# Patient Record
Sex: Male | Born: 1950 | Hispanic: Yes | Smoking: Former smoker
Health system: Southern US, Community
[De-identification: ages and names within clinical notes are randomized; demographics above are authoritative.]

## PROBLEM LIST (undated history)

## (undated) DIAGNOSIS — R972 Elevated prostate specific antigen [PSA]: Secondary | ICD-10-CM

## (undated) DIAGNOSIS — R0602 Shortness of breath: Secondary | ICD-10-CM

## (undated) DIAGNOSIS — K8020 Calculus of gallbladder without cholecystitis without obstruction: Secondary | ICD-10-CM

## (undated) DIAGNOSIS — I5030 Unspecified diastolic (congestive) heart failure: Secondary | ICD-10-CM

## (undated) DIAGNOSIS — Z7901 Long term (current) use of anticoagulants: Secondary | ICD-10-CM

## (undated) DIAGNOSIS — K7689 Other specified diseases of liver: Secondary | ICD-10-CM

## (undated) DIAGNOSIS — I2510 Atherosclerotic heart disease of native coronary artery without angina pectoris: Secondary | ICD-10-CM

## (undated) DIAGNOSIS — N401 Enlarged prostate with lower urinary tract symptoms: Secondary | ICD-10-CM

## (undated) DIAGNOSIS — I499 Cardiac arrhythmia, unspecified: Secondary | ICD-10-CM

## (undated) DIAGNOSIS — G4730 Sleep apnea, unspecified: Secondary | ICD-10-CM

## (undated) DIAGNOSIS — I10 Essential (primary) hypertension: Secondary | ICD-10-CM

## (undated) DIAGNOSIS — N4289 Other specified disorders of prostate: Secondary | ICD-10-CM

## (undated) DIAGNOSIS — M1990 Unspecified osteoarthritis, unspecified site: Secondary | ICD-10-CM

## (undated) DIAGNOSIS — N529 Male erectile dysfunction, unspecified: Secondary | ICD-10-CM

## (undated) DIAGNOSIS — G4733 Obstructive sleep apnea (adult) (pediatric): Secondary | ICD-10-CM

## (undated) DIAGNOSIS — I517 Cardiomegaly: Secondary | ICD-10-CM

## (undated) DIAGNOSIS — I700 Atherosclerosis of aorta: Secondary | ICD-10-CM

## (undated) DIAGNOSIS — E785 Hyperlipidemia, unspecified: Secondary | ICD-10-CM

## (undated) DIAGNOSIS — R399 Unspecified symptoms and signs involving the genitourinary system: Secondary | ICD-10-CM

## (undated) DIAGNOSIS — R079 Chest pain, unspecified: Secondary | ICD-10-CM

## (undated) DIAGNOSIS — R1310 Dysphagia, unspecified: Secondary | ICD-10-CM

## (undated) DIAGNOSIS — I480 Paroxysmal atrial fibrillation: Secondary | ICD-10-CM

## (undated) DIAGNOSIS — I509 Heart failure, unspecified: Secondary | ICD-10-CM

## (undated) DIAGNOSIS — N138 Other obstructive and reflux uropathy: Secondary | ICD-10-CM

## (undated) DIAGNOSIS — K219 Gastro-esophageal reflux disease without esophagitis: Secondary | ICD-10-CM

## (undated) HISTORY — DX: Male erectile dysfunction, unspecified: N52.9

## (undated) HISTORY — DX: Unspecified osteoarthritis, unspecified site: M19.90

## (undated) HISTORY — DX: Cardiomegaly: I51.7

## (undated) HISTORY — DX: Cardiac arrhythmia, unspecified: I49.9

## (undated) HISTORY — DX: Heart failure, unspecified: I50.9

## (undated) HISTORY — PX: HERNIA REPAIR: SHX51

## (undated) HISTORY — DX: Essential (primary) hypertension: I10

## (undated) HISTORY — DX: Sleep apnea, unspecified: G47.30

## (undated) HISTORY — DX: Shortness of breath: R06.02

## (undated) HISTORY — DX: Dysphagia, unspecified: R13.10

## (undated) HISTORY — DX: Elevated prostate specific antigen (PSA): R97.20

## (undated) HISTORY — DX: Atherosclerotic heart disease of native coronary artery without angina pectoris: I25.10

## (undated) HISTORY — DX: Unspecified symptoms and signs involving the genitourinary system: R39.9

## (undated) HISTORY — DX: Benign prostatic hyperplasia with lower urinary tract symptoms: N40.1

## (undated) HISTORY — DX: Other obstructive and reflux uropathy: N13.8

## (undated) HISTORY — DX: Other specified disorders of prostate: N42.89

## (undated) HISTORY — DX: Gastro-esophageal reflux disease without esophagitis: K21.9

---

## 2012-11-15 DIAGNOSIS — I2510 Atherosclerotic heart disease of native coronary artery without angina pectoris: Secondary | ICD-10-CM

## 2012-11-15 HISTORY — DX: Atherosclerotic heart disease of native coronary artery without angina pectoris: I25.10

## 2012-11-15 HISTORY — PX: CARDIAC CATHETERIZATION: SHX172

## 2012-11-15 HISTORY — PX: CORONARY ANGIOPLASTY WITH STENT PLACEMENT: SHX49

## 2014-06-12 DIAGNOSIS — H4011X0 Primary open-angle glaucoma, stage unspecified: Secondary | ICD-10-CM | POA: Diagnosis not present

## 2014-06-12 DIAGNOSIS — H04129 Dry eye syndrome of unspecified lacrimal gland: Secondary | ICD-10-CM | POA: Diagnosis not present

## 2014-07-08 DIAGNOSIS — I10 Essential (primary) hypertension: Secondary | ICD-10-CM | POA: Diagnosis not present

## 2014-07-08 DIAGNOSIS — R52 Pain, unspecified: Secondary | ICD-10-CM | POA: Diagnosis not present

## 2014-07-08 DIAGNOSIS — I4891 Unspecified atrial fibrillation: Secondary | ICD-10-CM | POA: Diagnosis not present

## 2014-07-08 DIAGNOSIS — R079 Chest pain, unspecified: Secondary | ICD-10-CM | POA: Diagnosis not present

## 2014-07-08 DIAGNOSIS — Z880 Allergy status to penicillin: Secondary | ICD-10-CM | POA: Diagnosis not present

## 2014-07-08 DIAGNOSIS — Z91199 Patient's noncompliance with other medical treatment and regimen due to unspecified reason: Secondary | ICD-10-CM | POA: Diagnosis not present

## 2014-07-08 DIAGNOSIS — H409 Unspecified glaucoma: Secondary | ICD-10-CM | POA: Diagnosis not present

## 2014-07-10 DIAGNOSIS — H4011X0 Primary open-angle glaucoma, stage unspecified: Secondary | ICD-10-CM | POA: Diagnosis not present

## 2014-12-27 ENCOUNTER — Emergency Department: Payer: Self-pay | Admitting: Emergency Medicine

## 2014-12-27 DIAGNOSIS — M79605 Pain in left leg: Secondary | ICD-10-CM | POA: Diagnosis not present

## 2014-12-27 DIAGNOSIS — R0789 Other chest pain: Secondary | ICD-10-CM | POA: Diagnosis not present

## 2014-12-27 DIAGNOSIS — I10 Essential (primary) hypertension: Secondary | ICD-10-CM | POA: Diagnosis not present

## 2014-12-27 DIAGNOSIS — I499 Cardiac arrhythmia, unspecified: Secondary | ICD-10-CM | POA: Diagnosis not present

## 2014-12-27 DIAGNOSIS — G8929 Other chronic pain: Secondary | ICD-10-CM | POA: Diagnosis not present

## 2014-12-27 DIAGNOSIS — R079 Chest pain, unspecified: Secondary | ICD-10-CM | POA: Diagnosis not present

## 2015-01-01 ENCOUNTER — Ambulatory Visit (INDEPENDENT_AMBULATORY_CARE_PROVIDER_SITE_OTHER): Payer: Medicare Other | Admitting: Cardiovascular Disease

## 2015-01-01 ENCOUNTER — Encounter: Payer: Self-pay | Admitting: Cardiovascular Disease

## 2015-01-01 VITALS — BP 130/90 | HR 64 | Ht 70.0 in | Wt 251.0 lb

## 2015-01-01 DIAGNOSIS — I499 Cardiac arrhythmia, unspecified: Secondary | ICD-10-CM | POA: Diagnosis not present

## 2015-01-01 DIAGNOSIS — R9431 Abnormal electrocardiogram [ECG] [EKG]: Secondary | ICD-10-CM | POA: Diagnosis not present

## 2015-01-01 DIAGNOSIS — I10 Essential (primary) hypertension: Secondary | ICD-10-CM

## 2015-01-01 DIAGNOSIS — Z7189 Other specified counseling: Secondary | ICD-10-CM | POA: Diagnosis not present

## 2015-01-01 DIAGNOSIS — Z7689 Persons encountering health services in other specified circumstances: Secondary | ICD-10-CM

## 2015-01-01 DIAGNOSIS — R0602 Shortness of breath: Secondary | ICD-10-CM | POA: Diagnosis not present

## 2015-01-01 NOTE — Patient Instructions (Addendum)
Your physician has requested that you have an echocardiogram. Echocardiography is a painless test that uses sound waves to create images of your heart. It provides your doctor with information about the size and shape of your heart and how well your heart's chambers and valves are working. This procedure takes approximately one hour. There are no restrictions for this procedure.  You have been referred to Dr. Sanda Klein at [facility]  On 01/15/15 at 1:30 pm  Matthew Jordan [id number] [phone]  Your physician recommends that you schedule a follow-up appointment in:  As needed with Dr. Fletcher Anon

## 2015-01-02 ENCOUNTER — Encounter: Payer: Self-pay | Admitting: Cardiovascular Disease

## 2015-01-02 DIAGNOSIS — R9431 Abnormal electrocardiogram [ECG] [EKG]: Secondary | ICD-10-CM | POA: Insufficient documentation

## 2015-01-02 DIAGNOSIS — I10 Essential (primary) hypertension: Secondary | ICD-10-CM | POA: Insufficient documentation

## 2015-01-02 NOTE — Assessment & Plan Note (Signed)
Blood pressure improved after resuming his antihypertensive medication. The patient mainly needs to establish with a primary care physician and he was referred today.

## 2015-01-02 NOTE — Assessment & Plan Note (Addendum)
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 He has no symptoms suggestive of angina or arrhythmia. He does complain of exertional dyspnea and thus I requested an echocardiogram to evaluate his LV systolic function.

## 2015-01-02 NOTE — Progress Notes (Signed)
   HPI  This is a 64 year old male who is here today for follow-up visit from the emergency room. He moved from [place] 2 months ago. He has known history of hypertension, unspecified prostate problem and arthritis. He reports no previous cardiac history. He currently lives in [place]. 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 He went to the emergency room recently mainly to get his prescriptions filled as his blood pressure was running high. Blood pressure was 170/106. Labs overall were unremarkable. He did report some fatigue and shortness of breath. Labs were overall unremarkable. Chest x-ray showed possible lung mass. However, CT scan showed no significant abnormalities. I believe that he was referred to [place] mainly for slightly abnormal EKG. However, the patient denies any chest pain or significant shortness of breath.  Allergies  Allergen Reactions  . Amoxicillin      No current outpatient prescriptions on file prior to visit.   No current facility-administered medications on file prior to visit.     Past Medical History  Diagnosis Date  . Prostate finding   . Irregular heart beat   . Hypertension   . Arthritis      Past Surgical History  Procedure Laterality Date  . Hernia repair    . Cardiac catheterization  2014    [place]     Family History  Problem Relation Age of Onset  . Hypertension Mother   . Hypertension Father      History   Social History  . Marital Status: Single    Spouse Name: N/A  . Number of Children: N/A  . Years of Education: N/A   Occupational History  . Not on file.   Social History Main Topics  . Smoking status: Former Research scientist (life sciences)  . Smokeless tobacco: Not on file  . Alcohol Use: Yes  . Drug Use: No  . Sexual Activity: Not on file   Other Topics Concern  . Not on file   Social History Narrative     ROS A 10 point review of system was performed. It is negative other than that mentioned in the history of present  illness.   PHYSICAL EXAM   BP 130/90 mmHg  Pulse 64  Ht 5\' 10"  (1.778 m)  Wt 251 lb (113.853 kg)  BMI 36.01 kg/m2 Constitutional: He is oriented to person, place, and time. He appears well-developed and well-nourished. No distress.  HENT: No nasal discharge.  Head: Normocephalic and atraumatic.  Eyes: Pupils are equal and round.  No discharge. Neck: Normal range of motion. Neck supple. No JVD present. No thyromegaly present.  Cardiovascular: Normal rate, regular rhythm, normal heart sounds. Exam reveals no gallop and no friction rub. No murmur heard.  Pulmonary/Chest: Effort normal and breath sounds normal. No stridor. No respiratory distress. He has no wheezes. He has no rales. He exhibits no tenderness.  Abdominal: Soft. Bowel sounds are normal. He exhibits no distension. There is no tenderness. There is no rebound and no guarding.  Musculoskeletal: Normal range of motion. He exhibits no edema and no tenderness.  Neurological: He is alert and oriented to person, place, and time. Coordination normal.  Skin: Skin is warm and dry. No rash noted. He is not diaphoretic. No erythema. No pallor.  Psychiatric: He has a normal mood and affect. His behavior is normal. Judgment and thought content normal.       IRS:WNIOE  Rhythm  Left anterior fascicular block  ABNORMAL    ASSESSMENT AND PLAN

## 2015-01-07 ENCOUNTER — Encounter: Payer: Self-pay | Admitting: *Deleted

## 2015-01-14 ENCOUNTER — Encounter: Payer: Self-pay | Admitting: *Deleted

## 2015-01-14 ENCOUNTER — Other Ambulatory Visit: Payer: Medicare Other

## 2015-01-14 DIAGNOSIS — R0989 Other specified symptoms and signs involving the circulatory and respiratory systems: Secondary | ICD-10-CM

## 2015-01-15 DIAGNOSIS — I10 Essential (primary) hypertension: Secondary | ICD-10-CM | POA: Diagnosis not present

## 2015-01-15 DIAGNOSIS — K219 Gastro-esophageal reflux disease without esophagitis: Secondary | ICD-10-CM | POA: Diagnosis not present

## 2015-01-15 DIAGNOSIS — R1310 Dysphagia, unspecified: Secondary | ICD-10-CM | POA: Diagnosis not present

## 2015-01-15 DIAGNOSIS — I499 Cardiac arrhythmia, unspecified: Secondary | ICD-10-CM | POA: Diagnosis not present

## 2015-01-15 DIAGNOSIS — N529 Male erectile dysfunction, unspecified: Secondary | ICD-10-CM | POA: Diagnosis not present

## 2015-01-15 DIAGNOSIS — Z23 Encounter for immunization: Secondary | ICD-10-CM | POA: Diagnosis not present

## 2015-01-15 DIAGNOSIS — N401 Enlarged prostate with lower urinary tract symptoms: Secondary | ICD-10-CM | POA: Diagnosis not present

## 2015-01-21 ENCOUNTER — Telehealth: Payer: Self-pay | Admitting: Cardiovascular Disease

## 2015-01-21 NOTE — Telephone Encounter (Signed)
Please review

## 2015-01-21 NOTE — Telephone Encounter (Signed)
Triage nurse confirming meds for pre procedure check list.  Please call office.  Maritza at [phone].

## 2015-01-22 ENCOUNTER — Telehealth: Payer: Self-pay

## 2015-01-22 NOTE — Telephone Encounter (Signed)
We can do one time refill. He is going to be following up with [place] only as needed and should request further refills from his primary care physician.

## 2015-01-22 NOTE — Telephone Encounter (Signed)
Patient was placed on medication for his blood pressure in teh ED  He would like to know if we can refill

## 2015-01-22 NOTE — Telephone Encounter (Signed)
Reviewed medications  Informed Herb Grays that these medications were ordered in ED and that patient recently saw his PCP who might have added more medication  Maritza verbalized understanding

## 2015-01-22 NOTE — Telephone Encounter (Signed)
Pt states he went to [facility] to pick up his "heart medicine, and it was not there" please call. He is not sure the name of it.

## 2015-01-24 NOTE — Telephone Encounter (Signed)
Patient stated meds have already been refilled by PCP

## 2015-01-27 DIAGNOSIS — Z1211 Encounter for screening for malignant neoplasm of colon: Secondary | ICD-10-CM | POA: Diagnosis not present

## 2015-01-27 DIAGNOSIS — R1310 Dysphagia, unspecified: Secondary | ICD-10-CM | POA: Diagnosis not present

## 2015-01-31 DIAGNOSIS — I499 Cardiac arrhythmia, unspecified: Secondary | ICD-10-CM | POA: Diagnosis not present

## 2015-01-31 DIAGNOSIS — G629 Polyneuropathy, unspecified: Secondary | ICD-10-CM | POA: Diagnosis not present

## 2015-01-31 DIAGNOSIS — N529 Male erectile dysfunction, unspecified: Secondary | ICD-10-CM | POA: Diagnosis not present

## 2015-01-31 DIAGNOSIS — R972 Elevated prostate specific antigen [PSA]: Secondary | ICD-10-CM | POA: Diagnosis not present

## 2015-01-31 DIAGNOSIS — N401 Enlarged prostate with lower urinary tract symptoms: Secondary | ICD-10-CM | POA: Diagnosis not present

## 2015-01-31 DIAGNOSIS — M179 Osteoarthritis of knee, unspecified: Secondary | ICD-10-CM | POA: Diagnosis not present

## 2015-01-31 DIAGNOSIS — I2510 Atherosclerotic heart disease of native coronary artery without angina pectoris: Secondary | ICD-10-CM | POA: Diagnosis not present

## 2015-01-31 DIAGNOSIS — R0602 Shortness of breath: Secondary | ICD-10-CM | POA: Diagnosis not present

## 2015-02-13 DIAGNOSIS — I10 Essential (primary) hypertension: Secondary | ICD-10-CM | POA: Diagnosis not present

## 2015-02-13 DIAGNOSIS — E669 Obesity, unspecified: Secondary | ICD-10-CM | POA: Diagnosis not present

## 2015-02-13 DIAGNOSIS — N401 Enlarged prostate with lower urinary tract symptoms: Secondary | ICD-10-CM | POA: Diagnosis not present

## 2015-02-17 ENCOUNTER — Other Ambulatory Visit: Payer: Medicare Other

## 2015-02-17 ENCOUNTER — Ambulatory Visit
Admit: 2015-02-17 | Disposition: A | Discharge status: Home / Self Care | Payer: Self-pay | Attending: Gastroenterology | Admitting: Gastroenterology

## 2015-02-17 DIAGNOSIS — I499 Cardiac arrhythmia, unspecified: Secondary | ICD-10-CM | POA: Diagnosis not present

## 2015-02-17 DIAGNOSIS — I10 Essential (primary) hypertension: Secondary | ICD-10-CM | POA: Diagnosis not present

## 2015-02-17 DIAGNOSIS — Z880 Allergy status to penicillin: Secondary | ICD-10-CM | POA: Diagnosis not present

## 2015-02-17 DIAGNOSIS — K2970 Gastritis, unspecified, without bleeding: Secondary | ICD-10-CM | POA: Diagnosis not present

## 2015-02-17 DIAGNOSIS — D123 Benign neoplasm of transverse colon: Secondary | ICD-10-CM | POA: Diagnosis not present

## 2015-02-17 DIAGNOSIS — K210 Gastro-esophageal reflux disease with esophagitis: Secondary | ICD-10-CM | POA: Diagnosis not present

## 2015-02-17 DIAGNOSIS — M1990 Unspecified osteoarthritis, unspecified site: Secondary | ICD-10-CM | POA: Diagnosis not present

## 2015-02-17 DIAGNOSIS — Z7982 Long term (current) use of aspirin: Secondary | ICD-10-CM | POA: Diagnosis not present

## 2015-02-17 DIAGNOSIS — Z79899 Other long term (current) drug therapy: Secondary | ICD-10-CM | POA: Diagnosis not present

## 2015-02-17 DIAGNOSIS — D120 Benign neoplasm of cecum: Secondary | ICD-10-CM | POA: Diagnosis not present

## 2015-02-17 DIAGNOSIS — N529 Male erectile dysfunction, unspecified: Secondary | ICD-10-CM | POA: Diagnosis not present

## 2015-02-17 DIAGNOSIS — D124 Benign neoplasm of descending colon: Secondary | ICD-10-CM | POA: Diagnosis not present

## 2015-02-17 DIAGNOSIS — D122 Benign neoplasm of ascending colon: Secondary | ICD-10-CM | POA: Diagnosis not present

## 2015-02-17 DIAGNOSIS — B9681 Helicobacter pylori [H. pylori] as the cause of diseases classified elsewhere: Secondary | ICD-10-CM | POA: Diagnosis not present

## 2015-02-17 DIAGNOSIS — K648 Other hemorrhoids: Secondary | ICD-10-CM | POA: Diagnosis not present

## 2015-02-17 DIAGNOSIS — K2950 Unspecified chronic gastritis without bleeding: Secondary | ICD-10-CM | POA: Diagnosis not present

## 2015-02-17 DIAGNOSIS — N401 Enlarged prostate with lower urinary tract symptoms: Secondary | ICD-10-CM | POA: Diagnosis not present

## 2015-02-17 DIAGNOSIS — D125 Benign neoplasm of sigmoid colon: Secondary | ICD-10-CM | POA: Diagnosis not present

## 2015-02-17 DIAGNOSIS — G4730 Sleep apnea, unspecified: Secondary | ICD-10-CM | POA: Diagnosis not present

## 2015-02-17 DIAGNOSIS — Z1211 Encounter for screening for malignant neoplasm of colon: Secondary | ICD-10-CM | POA: Diagnosis not present

## 2015-02-17 DIAGNOSIS — R1310 Dysphagia, unspecified: Secondary | ICD-10-CM | POA: Diagnosis not present

## 2015-03-10 ENCOUNTER — Other Ambulatory Visit: Payer: Medicare Other

## 2015-03-10 DIAGNOSIS — R0989 Other specified symptoms and signs involving the circulatory and respiratory systems: Secondary | ICD-10-CM

## 2015-03-11 ENCOUNTER — Encounter: Payer: Self-pay | Admitting: *Deleted

## 2015-05-15 ENCOUNTER — Ambulatory Visit: Payer: Self-pay | Admitting: Urology

## 2015-05-15 ENCOUNTER — Encounter: Payer: Self-pay | Admitting: Urology

## 2015-05-22 DIAGNOSIS — H10503 Unspecified blepharoconjunctivitis, bilateral: Secondary | ICD-10-CM | POA: Diagnosis not present

## 2015-05-22 DIAGNOSIS — H4011X4 Primary open-angle glaucoma, indeterminate stage: Secondary | ICD-10-CM | POA: Diagnosis not present

## 2015-05-23 DIAGNOSIS — H4011X4 Primary open-angle glaucoma, indeterminate stage: Secondary | ICD-10-CM | POA: Diagnosis not present

## 2015-05-30 ENCOUNTER — Other Ambulatory Visit: Payer: Self-pay

## 2015-05-30 ENCOUNTER — Emergency Department: Payer: Medicare Other

## 2015-05-30 ENCOUNTER — Emergency Department
Admission: EM | Admit: 2015-05-30 | Discharge: 2015-05-30 | Disposition: A | Discharge status: Home / Self Care | Payer: Medicare Other | Attending: Emergency Medicine | Admitting: Emergency Medicine

## 2015-05-30 ENCOUNTER — Encounter: Payer: Self-pay | Admitting: Emergency Medicine

## 2015-05-30 DIAGNOSIS — Z87891 Personal history of nicotine dependence: Secondary | ICD-10-CM | POA: Diagnosis not present

## 2015-05-30 DIAGNOSIS — Z880 Allergy status to penicillin: Secondary | ICD-10-CM | POA: Diagnosis not present

## 2015-05-30 DIAGNOSIS — I10 Essential (primary) hypertension: Secondary | ICD-10-CM | POA: Insufficient documentation

## 2015-05-30 DIAGNOSIS — R079 Chest pain, unspecified: Secondary | ICD-10-CM | POA: Insufficient documentation

## 2015-05-30 HISTORY — DX: Chest pain, unspecified: R07.9

## 2015-05-30 LAB — FIBRIN DERIVATIVES D-DIMER (ARMC ONLY): FIBRIN DERIVATIVES D-DIMER (ARMC): 379.58 (ref 0–499)

## 2015-05-30 LAB — TROPONIN I: Troponin I: <0.03 ng/mL (ref <0.031)

## 2015-05-30 LAB — COMPREHENSIVE METABOLIC PANEL
ALT: 22 U/L (ref 17–63)
AST: 24 U/L (ref 15–41)
Albumin: 4.1 g/dL (ref 3.5–5.0)
Alkaline Phosphatase: 66 U/L (ref 38–126)
Anion gap: 8 (ref 5–15)
BUN: 14 mg/dL (ref 6–20)
CO2: 26 mmol/L (ref 22–32)
Calcium: 8.9 mg/dL (ref 8.9–10.3)
Chloride: 106 mmol/L (ref 101–111)
Creatinine, Ser: 0.81 mg/dL (ref 0.61–1.24)
GFR calc Af Amer: >60 mL/min (ref >60)
Glucose, Bld: 158 mg/dL — ABNORMAL HIGH (ref 65–99)
Potassium: 3.3 mmol/L — ABNORMAL LOW (ref 3.5–5.1)
Sodium: 140 mmol/L (ref 135–145)
TOTAL PROTEIN: 6.8 g/dL (ref 6.5–8.1)
Total Bilirubin: 0.4 mg/dL (ref 0.3–1.2)

## 2015-05-30 LAB — CBC
HCT: 45.2 % (ref 40.0–52.0)
HEMOGLOBIN: 15.8 g/dL (ref 13.0–18.0)
MCH: 31.5 pg (ref 26.0–34.0)
MCHC: 35.1 g/dL (ref 32.0–36.0)
MCV: 89.7 fL (ref 80.0–100.0)
Platelets: 180 10*3/uL (ref 150–440)
RBC: 5.03 MIL/uL (ref 4.40–5.90)
RDW: 13.8 % (ref 11.5–14.5)
WBC: 8 10*3/uL (ref 3.8–10.6)

## 2015-05-30 NOTE — ED Notes (Signed)
Pt statese left sided chest pain for "years" worse over last 24 hours. Pt describes pain as stinging, feels shob at times and weak at times. Pt states does have intermittent nausea. Pt just finished driving back from [place].

## 2015-05-30 NOTE — ED Notes (Signed)
Pt reports sharp chest pain "for a long time". Pt reports pain has gotten worse over the past 24 hours. Pt reports SOB, pt is currently in no distress and is sitting on the edge of the bed laughing

## 2015-05-30 NOTE — Discharge Instructions (Signed)
Dolor de pecho (no especfico) (Chest Pain (Nonspecific)) Con frecuencia es difcil dar un diagnstico especfico de la causa del dolor de [place]. Siempre hay una posibilidad de que el dolor podra estar relacionado con algo grave, como un ataque al corazn o un cogulo sanguneo en los pulmones. Debe someterse a controles con el mdico para ms evaluaciones. CAUSAS   Acidez.  Neumona o bronquitis.  Ansiedad o estrs.  Inflamacin de la zona que rodea al corazn (pericarditis) o a los pulmones (pleuritis o pleuresa).  Un cogulo sanguneo en el pulmn.  Colapso de un pulmn (neumotrax), que puede aparecer de [employer] repentina por s solo (neumotrax espontneo) o debido a un traumatismo en el trax.  Culebrilla (virus del herpes zster). La pared torcica est compuesta por huesos, msculos y Database administrator. Cualquiera de estos puede ser la fuente del dolor.  Puede haber una contusin en los huesos debido a una lesin.  Puede haber un esguince en los msculos o el cartlago ocasionado por la tos o por [place].  El cartlago puede verse afectado por una inflamacin y Engineer, production (costocondritis). DIAGNSTICO  Ileene Hutchinson se necesiten anlisis de laboratorio u otros estudios para Animator causa del Social research officer, government. Adems, puede indicarle que se haga una prueba llamada electrocadiograma (ECG) ambulatorio. El ECG registra los patrones de los latidos cardacos durante 24horas. Adems, pueden hacerle otros estudios, por ejemplo:  Ecocardiograma transtorcico (ETT). Durante IT trainer, se usan ondas sonoras para evaluar el flujo de la sangre a travs del corazn.  Ecocardiograma transesofgico (ETE).  Monitoreo cardaco. Permite que el mdico controle la frecuencia y el ritmo cardaco en tiempo real.  Monitor Holter. Es un dispositivo porttil que [employer] latidos cardacos y [place] a Retail buyer las arritmias cardacas. Le permite al [employer] registrar la actividad [place], si es necesario.  Pruebas de estrs por ejercicio o por medicamentos que aceleran los latidos cardacos. TRATAMIENTO   El tratamiento depende de la causa del dolor de [place]. El tratamiento puede incluir:  Inhibidores de la acidez estomacal.  Antiinflamatorios.  Analgsicos para las enfermedades inflamatorias.  Antibiticos, si hay una infeccin.  Podrn aconsejarle que modifique su estilo de vida. Esto incluye dejar de fumar y evitar el alcohol, la cafena y el chocolate.  Pueden aconsejarle que mantenga la cabeza levantada (elevada) cuando duerme. Esto reduce la probabilidad de que el cido retroceda del estmago al esfago. En la [employer], el dolor de pecho no especfico mejorar en el trmino de 2 a 3das, con reposo y [employer].  INSTRUCCIONES PARA EL CUIDADO EN EL HOGAR   Si le prescriben antibiticos, tmelos tal como se le indic. Termnelos aunque comience a sentirse mejor.  [address] no haga actividades fsicas que provoquen dolor de [place]. Contine con las actividades fsicas tal como se le indic  No consuma ningn producto que contenga tabaco, incluidos cigarrillos, tabaco de Higher education careers adviser o cigarrillos electrnicos.  Evite el consumo de alcohol.  Tome los medicamentos solamente como se lo haya indicado el mdico.  Siga las sugerencias del mdico en lo que respecta a las pruebas adicionales, si el dolor de pecho no desaparece.  Concurra a todas las visitas de control programadas. Si no lo hace, podra desarrollar problemas permanentes (crnicos) relacionados con el dolor. Si hay algn problema para concurrir a una cita, llame para reprogramarla. SOLICITE ATENCIN MDICA SI:   El dolor de pecho no desaparece, incluso despus del tratamiento.  Tiene una erupcin cutnea con ampollas en el  pecho.  Jaclynn Guarneri. SOLICITE ATENCIN MDICA DE [facility] SI:   Aumenta el dolor de pecho o este se irradia hacia el  brazo, el cuello, la [place], la espalda o el abdomen.  Le falta el aire.  La tos empeora, o expectora sangre.  Siente dolor intenso en la espalda o el abdomen.  Se siente nauseoso o vomita.  Siente debilidad intensa.  Se desmaya.  Tiene escalofros. Esto es Engineer, maintenance (IT). No espere a ver si el dolor se pasa. Obtenga ayuda mdica de inmediato. Llame a los servicios de emergencia locales (911 en [place]). No conduzca por sus propios medios [employer] hospital. ASEGRESE DE QUE:   Comprende estas instrucciones.  Controlar su afeccin.  Recibir ayuda de inmediato si no mejora o si empeora. Document Released: 11/01/2005 Document Revised: 11/06/2013 [facility] Patient Information 2015 [place]. This information is not intended to replace advice given to you by your health care provider. Make sure you discuss any questions you have with your health care provider.

## 2015-05-30 NOTE — ED Provider Notes (Signed)
[facility] Emergency Department Provider Note  ____________________________________________  Time seen: Approximately 607 PM  I have reviewed the triage vital signs and the nursing notes.   HISTORY  Chief Complaint Chest Pain    HPI Matthew Jordan is a 64 y.o. male with hypertension and chronic chest pain who presents today with chest pain. He says that for the last 2 days he has been having sharp chest pain which last less than a second. He has had multiple episodes of this over the last 2 days but says that the last one stopped when he came to the hospital. He denies any recent shortness of breath. Says that he has had shortness of breath all his life when he has been undergoing strenuous activities. He nausea, vomiting, diaphoresis. Says that he has never had a stent placed. Is supposed to be on daily aspirin but does not take it. Denies any complaints at this time. Sees Dr. Sophronia Simas in the office.Says that he has these episodes about once a month.   Past Medical History  Diagnosis Date  . Prostate finding   . Irregular heart beat   . Hypertension   . Arthritis   . Chest pain     Patient Active Problem List   Diagnosis Date Noted  . Essential hypertension 01/02/2015  . Abnormal ECG 01/02/2015    Past Surgical History  Procedure Laterality Date  . Hernia repair    . Cardiac catheterization  2014    [place]    Current Outpatient Rx  Name  Route  Sig  Dispense  Refill  . lisinopril-hydrochlorothiazide (PRINZIDE,ZESTORETIC) 10-12.5 MG per tablet               . tamsulosin (FLOMAX) 0.4 MG CAPS capsule               . traMADol (ULTRAM) 50 MG tablet                 Allergies Amoxicillin  Family History  Problem Relation Age of Onset  . Hypertension Mother   . Hypertension Father     Social History History  Substance Use Topics  . Smoking status: Former Research scientist (life sciences)  . Smokeless tobacco: Never Used  . Alcohol Use: No     Review of Systems Constitutional: No fever/chills Eyes: No visual changes. ENT: No sore throat. Cardiovascular: As above Respiratory: Denies shortness of breath. Gastrointestinal: No abdominal pain.  No nausea, no vomiting.  No diarrhea.  No constipation. Genitourinary: Negative for dysuria. Musculoskeletal: Negative for back pain. Skin: Negative for rash. Neurological: Negative for headaches, focal weakness or numbness.  10-point ROS otherwise negative.  ____________________________________________   PHYSICAL EXAM:  VITAL SIGNS: ED Triage Vitals  Enc Vitals Group     BP 05/30/15 1916 142/91 mmHg     Pulse Rate 05/30/15 1916 72     Resp 05/30/15 1916 16     Temp 05/30/15 1916 98.7 F (37.1 C)     Temp Source 05/30/15 1916 Oral     SpO2 05/30/15 1916 96 %     Weight 05/30/15 1916 245 lb (111.131 kg)     Height 05/30/15 1916 5\' 10"  (1.778 m)     Head Cir --      Peak Flow --      Pain Score 05/30/15 1916 8     Pain Loc --      Pain Edu? --      Excl. in [place]? --     Constitutional:  Alert and oriented. Well appearing and in no acute distress. Eyes: Conjunctivae are normal. PERRL. EOMI. Head: Atraumatic. Nose: No congestion/rhinnorhea. Mouth/Throat: Mucous membranes are moist.  Oropharynx non-erythematous. Neck: No stridor.   Cardiovascular: Normal rate, regular rhythm. Grossly normal heart sounds.  Good peripheral circulation. Respiratory: Normal respiratory effort.  No retractions. Lungs CTAB. Gastrointestinal: Soft and nontender. No distention. No abdominal bruits. No CVA tenderness. Musculoskeletal: No lower extremity tenderness nor edema.  No joint effusions. Neurologic:  Normal speech and language. No gross focal neurologic deficits are appreciated. No gait instability. Skin:  Skin is warm, dry and intact. No rash noted. Psychiatric: Mood and affect are normal. Speech and behavior are normal.  ____________________________________________   LABS (all labs  ordered are listed, but only abnormal results are displayed)  Labs Reviewed  COMPREHENSIVE METABOLIC PANEL - Abnormal; Notable for the following:    Potassium 3.3 (*)    Glucose, Bld 158 (*)    All other components within normal limits  CBC  TROPONIN I  FIBRIN DERIVATIVES D-DIMER ([facility] ONLY)   ____________________________________________  EKG  ED ECG REPORT I, Doran Stabler, the attending physician, personally viewed and interpreted this ECG.   Date: 05/30/2015  EKG Time: 1907  Rate: 71  Rhythm: normal sinus rhythm  Axis: Axis deviation  Intervals:Incomplete right bundle-branch block.  ST&T Change: No ST elevations or depressions. No abnormal T-wave inversions. Unchanged EKG from that seen on 01/01/2015.  ____________________________________________  RADIOLOGY  No active disease on chest x-ray. I personally reviewed these images. ____________________________________________   PROCEDURES    ____________________________________________   INITIAL IMPRESSION / ASSESSMENT AND PLAN / ED COURSE  Pertinent labs & imaging results that were available during my care of the patient were reviewed by me and considered in my medical decision making (see chart for details).  ----------------------------------------- 9:45 PM on 05/30/2015 -----------------------------------------  Patient continues to be chest pain-free. Reassuring workup after 2 days of chest pain. No EKG changes and d-dimer as well as troponin are negative. Counseled the patient that he should follow-up with Dr. Sophronia Simas. The patient agrees and is willing to comply. We'll refill his prescriptions including his aspirin. ____________________________________________   FINAL CLINICAL IMPRESSION(S) / ED DIAGNOSES  Acute chest pain. Initial visit.    Orbie Pyo, MD 05/30/15 [phone]

## 2015-06-01 ENCOUNTER — Other Ambulatory Visit: Payer: Self-pay | Admitting: Family Medicine

## 2015-06-12 ENCOUNTER — Encounter: Payer: Self-pay | Admitting: *Deleted

## 2015-06-12 ENCOUNTER — Ambulatory Visit: Payer: Medicare Other | Admitting: Cardiovascular Disease

## 2015-08-12 ENCOUNTER — Telehealth: Payer: Self-pay | Admitting: Family Medicine

## 2015-08-12 NOTE — Telephone Encounter (Signed)
Please call patient; he has been apparently lost to follow-up; I've not seen him in over six months He was supposed to see me back in June, but I've not seen him back He no showed for his appointment with the urologist; he no showed with his cardiologist I'm just concerned about him TERESA, patient is Spanish-speaking Please call him and ask him to schedule an appointment with me sometime in the next few weeks I want to make sure he is taking care of himself, and we want to talk about his prostate, heart, cholesterol, etc. Have him come fasting please for labs

## 2015-08-15 NOTE — Telephone Encounter (Signed)
If you cannot reach patient, please send a letter; thank you

## 2015-08-15 NOTE — Telephone Encounter (Signed)
Third time calling patient but no answer and can not leave vm.

## 2015-08-18 ENCOUNTER — Telehealth: Payer: Self-pay | Admitting: Family Medicine

## 2015-08-18 NOTE — Telephone Encounter (Signed)
Letter send out 08/18/15

## 2015-08-20 NOTE — Telephone Encounter (Signed)
Send letter already

## 2015-08-20 NOTE — Telephone Encounter (Signed)
I am following up again on this; please send letter, then sign off on the note

## 2015-09-22 DIAGNOSIS — I499 Cardiac arrhythmia, unspecified: Secondary | ICD-10-CM | POA: Insufficient documentation

## 2015-09-22 DIAGNOSIS — R0602 Shortness of breath: Secondary | ICD-10-CM | POA: Insufficient documentation

## 2015-09-22 DIAGNOSIS — N529 Male erectile dysfunction, unspecified: Secondary | ICD-10-CM | POA: Insufficient documentation

## 2015-09-22 DIAGNOSIS — M1990 Unspecified osteoarthritis, unspecified site: Secondary | ICD-10-CM | POA: Insufficient documentation

## 2015-09-22 DIAGNOSIS — I2510 Atherosclerotic heart disease of native coronary artery without angina pectoris: Secondary | ICD-10-CM | POA: Insufficient documentation

## 2015-09-22 DIAGNOSIS — R972 Elevated prostate specific antigen [PSA]: Secondary | ICD-10-CM | POA: Insufficient documentation

## 2015-09-22 DIAGNOSIS — I517 Cardiomegaly: Secondary | ICD-10-CM | POA: Insufficient documentation

## 2015-09-22 DIAGNOSIS — N401 Enlarged prostate with lower urinary tract symptoms: Secondary | ICD-10-CM

## 2015-09-22 DIAGNOSIS — R1310 Dysphagia, unspecified: Secondary | ICD-10-CM | POA: Insufficient documentation

## 2015-09-22 DIAGNOSIS — N138 Other obstructive and reflux uropathy: Secondary | ICD-10-CM | POA: Insufficient documentation

## 2015-09-22 DIAGNOSIS — I10 Essential (primary) hypertension: Secondary | ICD-10-CM | POA: Insufficient documentation

## 2015-09-22 DIAGNOSIS — K219 Gastro-esophageal reflux disease without esophagitis: Secondary | ICD-10-CM | POA: Insufficient documentation

## 2015-10-03 ENCOUNTER — Encounter: Payer: Self-pay | Admitting: Family Medicine

## 2015-10-03 ENCOUNTER — Ambulatory Visit (INDEPENDENT_AMBULATORY_CARE_PROVIDER_SITE_OTHER): Payer: Medicare Other | Admitting: Family Medicine

## 2015-10-03 VITALS — BP 131/90 | HR 66 | Temp 98.1°F | Wt 255.0 lb

## 2015-10-03 DIAGNOSIS — R635 Abnormal weight gain: Secondary | ICD-10-CM | POA: Diagnosis not present

## 2015-10-03 DIAGNOSIS — F69 Unspecified disorder of adult personality and behavior: Secondary | ICD-10-CM

## 2015-10-03 DIAGNOSIS — I10 Essential (primary) hypertension: Secondary | ICD-10-CM | POA: Diagnosis not present

## 2015-10-03 DIAGNOSIS — M79672 Pain in left foot: Secondary | ICD-10-CM | POA: Diagnosis not present

## 2015-10-03 DIAGNOSIS — M79671 Pain in right foot: Secondary | ICD-10-CM | POA: Insufficient documentation

## 2015-10-03 DIAGNOSIS — N401 Enlarged prostate with lower urinary tract symptoms: Secondary | ICD-10-CM

## 2015-10-03 DIAGNOSIS — I2510 Atherosclerotic heart disease of native coronary artery without angina pectoris: Secondary | ICD-10-CM

## 2015-10-03 DIAGNOSIS — M722 Plantar fascial fibromatosis: Secondary | ICD-10-CM

## 2015-10-03 DIAGNOSIS — I868 Varicose veins of other specified sites: Secondary | ICD-10-CM | POA: Diagnosis not present

## 2015-10-03 DIAGNOSIS — Z5181 Encounter for therapeutic drug level monitoring: Secondary | ICD-10-CM | POA: Diagnosis not present

## 2015-10-03 DIAGNOSIS — R4689 Other symptoms and signs involving appearance and behavior: Secondary | ICD-10-CM

## 2015-10-03 DIAGNOSIS — R972 Elevated prostate specific antigen [PSA]: Secondary | ICD-10-CM | POA: Diagnosis not present

## 2015-10-03 DIAGNOSIS — I2583 Coronary atherosclerosis due to lipid rich plaque: Secondary | ICD-10-CM

## 2015-10-03 DIAGNOSIS — N138 Other obstructive and reflux uropathy: Secondary | ICD-10-CM

## 2015-10-03 DIAGNOSIS — I8390 Asymptomatic varicose veins of unspecified lower extremity: Secondary | ICD-10-CM | POA: Insufficient documentation

## 2015-10-03 NOTE — Assessment & Plan Note (Addendum)
Refer back to urologist; patient was lost to f/u, did not keep appt with her; check PSA today again

## 2015-10-03 NOTE — Assessment & Plan Note (Addendum)
Refer back to urologist; continue medication; the elevated PSA from before is concerning, and patient did not keep f/u

## 2015-10-03 NOTE — Progress Notes (Signed)
BP 131/90 mmHg  Pulse 66  Temp(Src) 98.1 F (36.7 C)  Wt 255 lb (115.667 kg)  SpO2 97%   Subjective:    Patient ID: Matthew Jordan, male    DOB: October 24, 1951, 64 y.o.   MRN: [id number]  HPI: REEVE EWELL is a 64 y.o. male  Chief Complaint  Patient presents with  . Coronary Artery Disease  . Leg Pain    bilateral legs  . Hyperlipidemia  . Benign Prostatic Hypertrophy    and elevated PSA, did not show up for Urology appt.   Pain in both heels about six months; painful in the legs, knees down; under the heels; burning in both feet  He has CAD; has cardiologist Dr. Kathlyn [place]; takes fruit smoothies, kale and spinach, carrots Does eat fatty meat; he exercises; feels good, no chest pain Last food was tostadas two hours ago  HTN; taking his medicine most days; does use salt  Son reports that he suffered an old head injury and back injury; has a lot of pain; mental suffering because of what happened; behavior changed, goes almost manic, so carefree; pain in lower back; they did a head scan and they said to f/u with doctor; patient denies the problems; he denies anger; falls; but did fall in 2008; got sick and ill; patient says if this was true, he would tell me about it, but it's not true  Positive thyroid disease in family; he has had weight gain  Relevant past medical, surgical, family and social history reviewed and updated as indicated. Interim medical history since our last visit reviewed. Allergies and medications reviewed and updated.  Review of Systems  Constitutional: Positive for unexpected weight change.  Respiratory: Negative for shortness of breath.   Cardiovascular: Negative for chest pain.  Gastrointestinal: Negative for blood in stool.  Genitourinary: Positive for decreased urine volume (without medicine, shuts off). Negative for hematuria.  Per HPI unless specifically indicated above     Objective:    BP 131/90 mmHg  Pulse 66  Temp(Src) 98.1 F  (36.7 C)  Wt 255 lb (115.667 kg)  SpO2 97%  Wt Readings from Last 3 Encounters:  10/07/15 253 lb 6.4 oz (114.941 kg)  10/03/15 255 lb (115.667 kg)  01/31/15 254 lb (115.214 kg)   body mass index is 36.59 kg/(m^2).  Physical Exam  Constitutional: He appears well-developed and well-nourished.  obese  HENT:  Head: Normocephalic and atraumatic.  Eyes: EOM are normal. No scleral icterus.  Neck: No thyromegaly present.  Cardiovascular: Normal rate and regular rhythm.   Pulmonary/Chest: Effort normal and breath sounds normal.  Abdominal: Soft. Bowel sounds are normal. He exhibits no distension. There is no tenderness.  Musculoskeletal: He exhibits no edema.       Right foot: There is tenderness (over the heel and plantar fascia).       Left foot: There is tenderness (over the heel and plantar fascia).  Ropey varicose veins in both legs  Neurological: He is alert.  Skin: Skin is warm. No pallor.  Psychiatric: He has a normal mood and affect. His behavior is normal. Judgment and thought content normal.  Some discordance between what patient's son reports and what patient says; not hostile, just different history, but both exhibited good eye contact with me, less so with one another      Assessment & Plan:   Problem List Items Addressed This Visit      Cardiovascular and Mediastinum   Essential hypertension - Primary  Fair control, given his age (32+yo); continue to work on [place] guidelines; try to lose weight      CAD (coronary artery disease)    Check lipids today; healthier eating encouraged, as well as weight loss      Relevant Orders   Lipid Panel w/o Chol/HDL Ratio (Completed)   Varicose veins    May be the cause of his leg pain; refer to vascular surgeon for evaluation, treatment      Relevant Orders   Ambulatory referral to Vascular Surgery     Genitourinary   BPH with urinary obstruction    Refer back to urologist; continue medication; the elevated PSA from  before is concerning, and patient did not keep f/u      Relevant Orders   Ambulatory referral to Urology   PSA (Completed)     Other   Elevated PSA    Refer back to urologist; patient was lost to f/u, did not keep appt with her; check PSA today again      Relevant Orders   Ambulatory referral to Urology   PSA (Completed)   Pain of both heels    xrays ordered; may have heel spurs, as well as plantar fasciitis      Relevant Orders   DG Foot 2 Views Left   DG Foot Complete Right   Behavior concern    Son worried about behavioral changes; patient refusing to admit this is true; I have not exhibited anything worrisome here in the office; not sure of the dynamics; I stressed to the patient that if there is something going on, I want to help him; if he has behavioral changes and falls, there may be something in fact wrong and perhaps treatable that needs to be looked into; discussed, e.g., normal pressure hydrocephalus as an example of something that can affect the brain, falls, and is treatable and we can test for that and other things if he will agree; he will consider and let me know       Other Visit Diagnoses    Medication monitoring encounter        Relevant Orders    CBC with Differential/Platelet (Completed)    Comprehensive metabolic panel (Completed)    Abnormal weight gain        Relevant Orders    TSH (Completed)    Plantar fasciitis, bilateral           Follow up plan: Return in about 3 months (around 01/03/2016) for blood pressure, leg pain.  An after-visit summary was printed and given to the patient at [place].  Please see the patient instructions which may contain other information and recommendations beyond what is mentioned above in the assessment and plan.  Orders Placed This Encounter  Procedures  . DG Foot 2 Views Left  . DG Foot Complete Right  . PSA  . CBC with Differential/Platelet  . Lipid Panel w/o Chol/HDL Ratio  . Comprehensive metabolic panel   . TSH  . Ambulatory referral to Urology  . Ambulatory referral to Vascular Surgery

## 2015-10-03 NOTE — Assessment & Plan Note (Addendum)
Check lipids today; healthier eating encouraged, as well as weight loss

## 2015-10-03 NOTE — Patient Instructions (Addendum)
We'll get you back in to see the urologist We'll have you see the vein doctor Please have xrays done We'll get labs today If you have not heard anything from my staff in a week about any orders/referrals/studies from today, please contact us here to follow-up [phone] Try to lose weight, about 15 pounds before your next visit Try turmeric as a natural anti-inflammatory (for pain and arthritis). It comes in capsules where you buy aspirin and fish oil, but also as a spice where you buy pepper and garlic powder. If you want to have a head scan done, just call me Read about normal pressure hydrocephalus and call me or see me with any questions because we would want to treat that; I won't push you, but want you to know there may be a reason for the symptoms your son says you have I can also refer you to work with a physical therapist to evaluate your falls Cut back on salt and fatty meats Return in 3 months  Plantar Fasciitis Plantar fasciitis is a painful foot condition that affects the heel. It occurs when the band of tissue that connects the toes to the heel bone (plantar fascia) becomes irritated. This can happen after exercising too much or doing other repetitive activities (overuse injury). The pain from plantar fasciitis can range from mild irritation to severe pain that makes it difficult for you to walk or move. The pain is usually worse in the morning or after you have been sitting or lying down for a while. CAUSES This condition may be caused by:  Standing for long periods of time.  Wearing shoes that do not fit.  Doing high-impact activities, including running, aerobics, and ballet.  Being overweight.  Having an abnormal way of walking (gait).  Having tight calf muscles.  Having high arches in your feet.  Starting a new athletic activity. SYMPTOMS The main symptom of this condition is heel pain. Other symptoms include:  Pain that gets worse after activity or  exercise.  Pain that is worse in the morning or after resting.  Pain that goes away after you walk for a few minutes. DIAGNOSIS This condition may be diagnosed based on your signs and symptoms. Your health care provider will also do a physical exam to check for:  A tender area on the bottom of your foot.  A high arch in your foot.  Pain when you move your foot.  Difficulty moving your foot. You may also need to have imaging studies to confirm the diagnosis. These can include:  X-rays.  Ultrasound.  MRI. TREATMENT  Treatment for plantar fasciitis depends on the severity of the condition. Your treatment may include:  Rest, ice, and over-the-counter pain medicines to manage your pain.  Exercises to stretch your calves and your plantar fascia.  A splint that holds your foot in a stretched, upward position while you sleep (night splint).  Physical therapy to relieve symptoms and prevent problems in the future.  Cortisone injections to relieve severe pain.  Extracorporeal shock wave therapy (ESWT) to stimulate damaged plantar fascia with electrical impulses. It is often used as a last resort before surgery.  Surgery, if other treatments have not worked after 12 months. HOME CARE INSTRUCTIONS  Take medicines only as directed by your health care provider.  Avoid activities that cause pain.  Roll the bottom of your foot over a bag of ice or a bottle of cold water. Do this for 20 minutes, 3-4 times a day.  Perform simple stretches as directed by your health care provider.  Try wearing athletic shoes with air-sole or gel-sole cushions or soft shoe inserts.  Wear a night splint while sleeping, if directed by your health care provider.  Keep all follow-up appointments with your health care provider. PREVENTION   Do not perform exercises or activities that cause heel pain.  Consider finding low-impact activities if you continue to have problems.  Lose weight if you need  to. The best way to prevent plantar fasciitis is to avoid the activities that aggravate your plantar fascia. SEEK MEDICAL CARE IF:  Your symptoms do not go away after treatment with home care measures.  Your pain gets worse.  Your pain affects your ability to move or do your daily activities.   This information is not intended to replace advice given to you by your health care provider. Make sure you discuss any questions you have with your health care provider.   Document Released: 07/27/2001 Document Revised: 07/23/2015 Document Reviewed: 09/11/2014 Elsevier Interactive Patient Education 2016 Elsevier [employer] Varicose Veins Varicose veins are veins that have become enlarged and twisted. They are usually seen in the legs but can occur in other parts of the body as well. CAUSES This condition is the result of valves in the veins not working properly. Valves in the veins help to return blood from the leg to the heart. If these valves are damaged, blood flows backward and backs up into the veins in the leg near the skin. This causes the veins to become larger. RISK FACTORS People who are on their feet a lot, who are pregnant, or who are overweight are more likely to develop varicose veins. SIGNS AND SYMPTOMS  Bulging, twisted-appearing, bluish veins, most commonly found on the legs.  Leg pain or a feeling of heaviness. These symptoms may be worse at the end of the day.  Leg swelling.  Changes in skin color. DIAGNOSIS A health care provider can usually diagnose varicose veins by examining your legs. Your health care provider may also recommend an ultrasound of your leg veins. TREATMENT Most varicose veins can be treated at home.However, other treatments are available for people who have persistent symptoms or want to improve the cosmetic appearance of the varicose veins. These treatment options include:  Sclerotherapy. A solution is injected into the vein to close it off.  Laser  treatment. A laser is used to heat the vein to close it off.  Radiofrequency vein ablation. An electrical current produced by radio waves is used to close off the vein.  Phlebectomy. The vein is surgically removed through small incisions made over the varicose vein.  Vein ligation and stripping. The vein is surgically removed through incisions made over the varicose vein after the vein has been tied (ligated). HOME CARE INSTRUCTIONS  Do not stand or sit in one position for long periods of time. Do not sit with your legs crossed. Rest with your legs raised during the day.  Wear compression stockings as directed by your health care provider. These stockings help to prevent blood clots and reduce swelling in your legs.  Do not wear other tight, encircling garments around your legs, pelvis, or waist.  Walk as much as possible to increase blood flow.  Raise the foot of your bed at night with 2-inch blocks.  If you get a cut in the skin over the vein and the vein bleeds, lie down with your leg raised and press on it with a clean cloth  until the bleeding stops. Then place a bandage (dressing) on the cut. See your health care provider if it continues to bleed. SEEK MEDICAL CARE IF:  The skin around your ankle starts to break down.  You have pain, redness, tenderness, or hard swelling in your leg over a vein.  You are uncomfortable because of leg pain.   This information is not intended to replace advice given to you by your health care provider. Make sure you discuss any questions you have with your health care provider.   Document Released: 08/11/2005 Document Revised: 11/22/2014 Document Reviewed: 03/19/2014 Elsevier Interactive Patient Education [employer].

## 2015-10-04 ENCOUNTER — Encounter: Payer: Self-pay | Admitting: Family Medicine

## 2015-10-04 LAB — CBC WITH DIFFERENTIAL/PLATELET
BASOS: 0 %
Basophils Absolute: 0 10*3/uL (ref 0.0–0.2)
EOS (ABSOLUTE): 0.1 10*3/uL (ref 0.0–0.4)
EOS: 2 %
HEMATOCRIT: 43.5 % (ref 37.5–51.0)
HEMOGLOBIN: 15.5 g/dL (ref 12.6–17.7)
IMMATURE GRANULOCYTES: 0 %
Immature Grans (Abs): 0 10*3/uL (ref 0.0–0.1)
Lymphocytes Absolute: 1.6 10*3/uL (ref 0.7–3.1)
Lymphs: 28 %
MCH: 31.4 pg (ref 26.6–33.0)
MCHC: 35.6 g/dL (ref 31.5–35.7)
MCV: 88 fL (ref 79–97)
MONOCYTES: 8 %
MONOS ABS: 0.5 10*3/uL (ref 0.1–0.9)
Neutrophils Absolute: 3.7 10*3/uL (ref 1.4–7.0)
Neutrophils: 62 %
Platelets: 204 10*3/uL (ref 150–379)
RBC: 4.93 x10E6/uL (ref 4.14–5.80)
RDW: 13.6 % (ref 12.3–15.4)
WBC: 5.9 10*3/uL (ref 3.4–10.8)

## 2015-10-04 LAB — LIPID PANEL W/O CHOL/HDL RATIO
Cholesterol, Total: 166 mg/dL (ref 100–199)
HDL: 32 mg/dL — AB (ref >39)
LDL Calculated: 96 mg/dL (ref 0–99)
TRIGLYCERIDES: 192 mg/dL — AB (ref 0–149)
VLDL Cholesterol Cal: 38 mg/dL (ref 5–40)

## 2015-10-04 LAB — COMPREHENSIVE METABOLIC PANEL
ALBUMIN: 4.5 g/dL (ref 3.6–4.8)
ALT: 20 IU/L (ref 0–44)
AST: 20 IU/L (ref 0–40)
Albumin/Globulin Ratio: 2 (ref 1.1–2.5)
Alkaline Phosphatase: 62 IU/L (ref 39–117)
BILIRUBIN TOTAL: 0.4 mg/dL (ref 0.0–1.2)
BUN / CREAT RATIO: 18 (ref 10–22)
BUN: 14 mg/dL (ref 8–27)
CO2: 24 mmol/L (ref 18–29)
Calcium: 9.3 mg/dL (ref 8.6–10.2)
Chloride: 104 mmol/L (ref 97–106)
Creatinine, Ser: 0.78 mg/dL (ref 0.76–1.27)
GFR calc Af Amer: 109 mL/min/{1.73_m2} (ref >59)
GFR calc non Af Amer: 94 mL/min/{1.73_m2} (ref >59)
GLUCOSE: 102 mg/dL — AB (ref 65–99)
Globulin, Total: 2.3 g/dL (ref 1.5–4.5)
Potassium: 3.6 mmol/L (ref 3.5–5.2)
Sodium: 143 mmol/L (ref 136–144)
Total Protein: 6.8 g/dL (ref 6.0–8.5)

## 2015-10-04 LAB — PSA: Prostate Specific Ag, Serum: 7.4 ng/mL — ABNORMAL HIGH (ref 0.0–4.0)

## 2015-10-04 LAB — TSH: TSH: 2 u[IU]/mL (ref 0.450–4.500)

## 2015-10-07 ENCOUNTER — Encounter: Payer: Self-pay | Admitting: Urology

## 2015-10-07 ENCOUNTER — Ambulatory Visit (INDEPENDENT_AMBULATORY_CARE_PROVIDER_SITE_OTHER): Payer: Medicare Other | Admitting: Urology

## 2015-10-07 VITALS — BP 143/89 | HR 70 | Ht 71.0 in | Wt 253.4 lb

## 2015-10-07 DIAGNOSIS — N529 Male erectile dysfunction, unspecified: Secondary | ICD-10-CM

## 2015-10-07 DIAGNOSIS — N400 Enlarged prostate without lower urinary tract symptoms: Secondary | ICD-10-CM | POA: Diagnosis not present

## 2015-10-07 DIAGNOSIS — N528 Other male erectile dysfunction: Secondary | ICD-10-CM | POA: Diagnosis not present

## 2015-10-07 DIAGNOSIS — R972 Elevated prostate specific antigen [PSA]: Secondary | ICD-10-CM

## 2015-10-07 DIAGNOSIS — N138 Other obstructive and reflux uropathy: Secondary | ICD-10-CM | POA: Insufficient documentation

## 2015-10-07 DIAGNOSIS — N401 Enlarged prostate with lower urinary tract symptoms: Secondary | ICD-10-CM | POA: Diagnosis not present

## 2015-10-07 DIAGNOSIS — I2510 Atherosclerotic heart disease of native coronary artery without angina pectoris: Secondary | ICD-10-CM

## 2015-10-07 LAB — BLADDER SCAN AMB NON-IMAGING: Scan Result: 22

## 2015-10-07 MED ORDER — SILDENAFIL CITRATE 100 MG PO TABS: 100.0000 mg | ORAL_TABLET | Freq: Every day | ORAL | Status: DC | PRN

## 2015-10-07 MED ORDER — FINASTERIDE 5 MG PO TABS: 5.0000 mg | ORAL_TABLET | Freq: Every day | ORAL | Status: DC

## 2015-10-07 NOTE — Progress Notes (Signed)
10/07/2015 2:38 PM   Matthew Jordan 24-Nov-1950 [id number]  Referring provider: Arnetha Courser, MD [address] [place], [place] [address]  Chief Complaint  Patient presents with  . Benign Prostatic Hypertrophy    follow up 6 month    HPI: Patient is a 64 year old Hispanic male who presents today for erectile dysfunction and BPH with LUTS for a 6 month follow up.    Erectile dysfunction Patient responds well to PDE5-inhibitors, but he finds them cost prohibitive.  He is requesting a prescription for Viagra to his pharmacy, but he would like the prescription to be for 6 pills for a 30 day supply.  BPH WITH LUTS His IPSS score today is 6, which is mild lower urinary tract symptomatology. He is delighted with his quality life due to his urinary symptoms. His PVR is 22 mL.  He denies any dysuria, hematuria or suprapubic pain.   He currently taking tamsulosin 0.4 mg daily.  He also denies any recent fevers, chills, nausea or vomiting.  He has a family history of PCa, with his father being diagnosed with prostate cancer.   His PSA was found to be 6.5 ng/mL in 01/2015 and he was to return for follow up.  He had been in [place] visiting his family until now.        IPSS      10/07/15 1000       International Prostate Symptom Score   How often have you had the sensation of not emptying your bladder? Not at All     How often have you had to urinate less than every two hours? Less than 1 in 5 times     How often have you found you stopped and started again several times when you urinated? Less than half the time     How often have you found it difficult to postpone urination? Not at All     How often have you had a weak urinary stream? Not at All     How often have you had to strain to start urination? Not at All     How many times did you typically get up at night to urinate? 3 Times     Total IPSS Score 6     Quality of Life due to urinary symptoms   If you were to spend the rest of your  life with your urinary condition just the way it is now how would you feel about that? Delighted        Score:  1-7 Mild 8-19 Moderate 20-35 Severe  Elevated PSA Patient's PSA was found to be 6.5 back in March 2016.  He was to follow up, but he was in [place] visiting family.  He has returned today.  A PSA drawn on 10/03/2015 was 7.4 ng/mL.  I have repeated the test today.   PMH: Past Medical History  Diagnosis Date  . Prostate finding   . Irregular heart beat   . Chest pain   . CAD (coronary artery disease) Fall 2014    40% stenosis of LAD on cardiac cath  . Arthritis   . Hypertension   . GERD (gastroesophageal reflux disease)   . CHF (congestive heart failure) ([place])   . Sleep apnea   . CAD (coronary artery disease)   . Elevated PSA   . SOB (shortness of breath)   . Left ventricular hypertrophy Fall 2014    mild concentric  . Cardiac dysrhythmia   .  Cardiac arrhythmia   . SOB (shortness of breath)   . Dysphagia   . ED (erectile dysfunction)   . BPH with urinary obstruction     Surgical History: Past Surgical History  Procedure Laterality Date  . Hernia repair    . Cardiac catheterization  2014    [place] Medications:    Medication List       This list is accurate as of: 10/07/15  2:38 PM.  Always use your most recent med list.               aspirin EC 81 MG tablet  Take 81 mg by mouth daily.     finasteride 5 MG tablet  Commonly known as:  PROSCAR  Take 1 tablet (5 mg total) by mouth daily.     lisinopril-hydrochlorothiazide 10-12.5 MG tablet  Commonly known as:  PRINZIDE,ZESTORETIC  Take 1 tablet by mouth daily.     sildenafil 100 MG tablet  Commonly known as:  VIAGRA  Take 1 tablet (100 mg total) by mouth daily as needed for erectile dysfunction.     tamsulosin 0.4 MG Caps capsule  Commonly known as:  FLOMAX  Take 0.4 mg by mouth daily.     traMADol 50 MG tablet  Commonly known as:  ULTRAM  Take 50 mg by mouth. Take 1-2 tabs  every 6 hours PRN        Allergies:  Allergies  Allergen Reactions  . Amoxicillin     Family History: Family History  Problem Relation Age of Onset  . Hypertension Mother   . Lung disease Mother   . Hypertension Father   . Cancer Father     possible prostate cancer  . Kidney disease Neg Hx     Social History:  reports that he quit smoking about 35 years ago. His smoking use included Cigarettes. He has a 2.5 pack-year smoking history. He has never used smokeless tobacco. He reports that he drinks alcohol. He reports that he does not use illicit drugs.  ROS: UROLOGY Frequent Urination?: Yes Hard to postpone urination?: Yes Burning/pain with urination?: Yes Get up at night to urinate?: Yes Leakage of urine?: Yes Urine stream starts and stops?: No Trouble starting stream?: No Do you have to strain to urinate?: No Blood in urine?: No Urinary tract infection?: No Sexually transmitted disease?: No Injury to kidneys or bladder?: No Painful intercourse?: No Weak stream?: No Erection problems?: Yes Penile pain?: No  Gastrointestinal Nausea?: No Vomiting?: No Indigestion/heartburn?: Yes Diarrhea?: No Constipation?: No  Constitutional Fever: No Night sweats?: No Weight loss?: No Fatigue?: Yes  Skin Skin rash/lesions?: No Itching?: No  Eyes Blurred vision?: No Double vision?: No  Ears/Nose/Throat Sore throat?: No Sinus problems?: No  Hematologic/Lymphatic Swollen glands?: No Easy bruising?: No  Cardiovascular Leg swelling?: Yes Chest pain?: No  Respiratory Cough?: No Shortness of breath?: No  Endocrine Excessive thirst?: No  Musculoskeletal Back pain?: No Joint pain?: Yes  Neurological Headaches?: No Dizziness?: No  Psychologic Depression?: No Anxiety?: No  Physical Exam: BP 143/89 mmHg  Pulse 70  Ht 5\' 11"  (1.803 m)  Wt 253 lb 6.4 oz (114.941 kg)  BMI 35.36 kg/m2  GU: No CVA tenderness.  No bladder fullness or masses.  Patient  with circumcised phallus.   Urethral meatus is patent.  No penile discharge. No penile lesions or rashes. Scrotum without lesions, cysts, rashes and/or edema.  Testicles are located scrotally bilaterally. No masses are appreciated in the testicles.  Left and right epididymis are normal. Rectal: Patient with  normal sphincter tone. Anus and perineum without scarring or rashes. No rectal masses are appreciated. Prostate is approximately 60 grams, no nodules are appreciated. Seminal vesicles are normal.   Laboratory Data: Lab Results  Component Value Date   WBC 5.9 10/03/2015   HGB 15.8 05/30/2015   HCT 43.5 10/03/2015   MCV 89.7 05/30/2015   PLT 180 05/30/2015    Lab Results  Component Value Date   CREATININE 0.78 10/03/2015    Lab Results  Component Value Date   PSA 7.4* 10/03/2015    Pertinent Imaging: Results for LAMBERTO, ELGART (MRN [id number]) as of 10/07/2015 14:16  Ref. Range 10/07/2015 09:56  Scan Result Unknown 22    Assessment & Plan:    1. BPH (benign prostatic hyperplasia) with LUTS:    IPSS score is 6/0.  He will continue the tamsulosin 0.4 mg daily.  He would like to restart the finasteride in an effort to reduce the size of his prostate.  His current PSA is 7.4 ng/mL on 10/03/2015.  He had it repeated today.  Follow up will be based on those results.  If it stabilizes, he will return in 6 months for IPSS score, exam and PSA.    - PSA - BLADDER SCAN AMB NON-IMAGING  2. Erectile dysfunction:   Patient responds well to PDE5-inhibitors.  He is requesting a prescription for Viagra to his pharmacy.  I have given him some samples.  He will fill out a SHIM score sheet upon return to the clinic.    3. Elevated PSA:   His PSA was found to be 6.5 in March 2016 and he returns today for follow up.  His current PSA is 7.4 ng/mL on 10/03/2015.  He had it repeated today.  Follow up will be based on those results.     Return in about 6 months (around 04/05/2016) for IPSS and  exam.  Zara Council, [facility] [address] [place],  [address] [phone]

## 2015-10-08 DIAGNOSIS — R4689 Other symptoms and signs involving appearance and behavior: Secondary | ICD-10-CM | POA: Insufficient documentation

## 2015-10-08 LAB — PSA: Prostate Specific Ag, Serum: 7 ng/mL — ABNORMAL HIGH (ref 0.0–4.0)

## 2015-10-08 NOTE — Assessment & Plan Note (Signed)
Fair control, given his age (88+yo); continue to work on [facility] guidelines; try to lose weight

## 2015-10-08 NOTE — Assessment & Plan Note (Signed)
Son worried about behavioral changes; patient refusing to admit this is true; I have not exhibited anything worrisome here in the office; not sure of the dynamics; I stressed to the patient that if there is something going on, I want to help him; if he has behavioral changes and falls, there may be something in fact wrong and perhaps treatable that needs to be looked into; discussed, e.g., normal pressure hydrocephalus as an example of something that can affect the brain, falls, and is treatable and we can test for that and other things if he will agree; he will consider and let me know

## 2015-10-08 NOTE — Assessment & Plan Note (Signed)
xrays ordered; may have heel spurs, as well as plantar fasciitis

## 2015-10-08 NOTE — Assessment & Plan Note (Signed)
May be the cause of his leg pain; refer to vascular surgeon for evaluation, treatment

## 2015-10-10 ENCOUNTER — Other Ambulatory Visit: Payer: Self-pay | Admitting: Family Medicine

## 2015-10-13 ENCOUNTER — Telehealth: Payer: Self-pay

## 2015-10-13 DIAGNOSIS — R972 Elevated prostate specific antigen [PSA]: Secondary | ICD-10-CM

## 2015-10-13 NOTE — Telephone Encounter (Signed)
Spoke with pt in reference to PSA. Pt voiced understanding and will have labs drawn again 01/13/16. Orders placed.

## 2015-10-13 NOTE — Telephone Encounter (Signed)
-----   Message from Nori Riis, PA-C sent at 10/13/2015  9:37 AM EST ----- Patient's PSA is elevated.  I would like to have the PSA repeated in 3 months.

## 2015-10-20 ENCOUNTER — Other Ambulatory Visit: Payer: Self-pay | Admitting: Cardiovascular Disease

## 2015-10-20 DIAGNOSIS — R9431 Abnormal electrocardiogram [ECG] [EKG]: Secondary | ICD-10-CM

## 2015-10-30 ENCOUNTER — Other Ambulatory Visit: Payer: Medicare Other

## 2015-11-09 DIAGNOSIS — B349 Viral infection, unspecified: Secondary | ICD-10-CM | POA: Diagnosis not present

## 2015-11-09 DIAGNOSIS — Z955 Presence of coronary angioplasty implant and graft: Secondary | ICD-10-CM | POA: Diagnosis not present

## 2015-11-09 DIAGNOSIS — R918 Other nonspecific abnormal finding of lung field: Secondary | ICD-10-CM | POA: Diagnosis not present

## 2015-11-09 DIAGNOSIS — R05 Cough: Secondary | ICD-10-CM | POA: Diagnosis not present

## 2015-11-09 DIAGNOSIS — I517 Cardiomegaly: Secondary | ICD-10-CM | POA: Diagnosis not present

## 2015-11-09 DIAGNOSIS — I10 Essential (primary) hypertension: Secondary | ICD-10-CM | POA: Diagnosis not present

## 2015-11-09 DIAGNOSIS — Z87891 Personal history of nicotine dependence: Secondary | ICD-10-CM | POA: Diagnosis not present

## 2015-11-09 DIAGNOSIS — J9809 Other diseases of bronchus, not elsewhere classified: Secondary | ICD-10-CM | POA: Diagnosis not present

## 2015-11-09 DIAGNOSIS — Z7982 Long term (current) use of aspirin: Secondary | ICD-10-CM | POA: Diagnosis not present

## 2015-11-10 ENCOUNTER — Other Ambulatory Visit: Payer: Self-pay | Admitting: Unknown Physician Specialty

## 2015-11-11 NOTE — Telephone Encounter (Signed)
Your patient 

## 2015-11-12 NOTE — Telephone Encounter (Signed)
CMP from Nov 2016 reviewed; Rx approved

## 2016-01-06 ENCOUNTER — Ambulatory Visit: Payer: Medicare Other | Admitting: Family Medicine

## 2016-01-13 ENCOUNTER — Encounter: Payer: Self-pay | Admitting: Urology

## 2016-01-13 ENCOUNTER — Other Ambulatory Visit: Payer: Self-pay

## 2016-01-28 DIAGNOSIS — I10 Essential (primary) hypertension: Secondary | ICD-10-CM | POA: Diagnosis not present

## 2016-01-28 DIAGNOSIS — N401 Enlarged prostate with lower urinary tract symptoms: Secondary | ICD-10-CM | POA: Diagnosis not present

## 2016-02-08 DIAGNOSIS — K219 Gastro-esophageal reflux disease without esophagitis: Secondary | ICD-10-CM | POA: Diagnosis not present

## 2016-03-08 DIAGNOSIS — Z139 Encounter for screening, unspecified: Secondary | ICD-10-CM | POA: Diagnosis not present

## 2016-03-08 DIAGNOSIS — K219 Gastro-esophageal reflux disease without esophagitis: Secondary | ICD-10-CM | POA: Diagnosis not present

## 2016-03-08 DIAGNOSIS — I10 Essential (primary) hypertension: Secondary | ICD-10-CM | POA: Diagnosis not present

## 2016-03-08 DIAGNOSIS — N401 Enlarged prostate with lower urinary tract symptoms: Secondary | ICD-10-CM | POA: Diagnosis not present

## 2016-03-09 ENCOUNTER — Telehealth: Payer: Self-pay | Admitting: Cardiovascular Disease

## 2016-03-09 NOTE — Telephone Encounter (Signed)
Patient is living in [place] and does not wish to do ordered tests or fu with Dr. Fletcher Anon at this time.  Patient is going to seek local cardiologist.

## 2016-03-30 ENCOUNTER — Encounter: Payer: Self-pay | Admitting: Urology

## 2016-03-30 ENCOUNTER — Other Ambulatory Visit: Payer: Medicare Other

## 2016-04-06 ENCOUNTER — Encounter: Payer: Self-pay | Admitting: Urology

## 2016-04-06 ENCOUNTER — Ambulatory Visit: Payer: Medicare Other | Admitting: Urology

## 2016-09-01 DIAGNOSIS — N401 Enlarged prostate with lower urinary tract symptoms: Secondary | ICD-10-CM | POA: Diagnosis not present

## 2016-09-01 DIAGNOSIS — I10 Essential (primary) hypertension: Secondary | ICD-10-CM | POA: Diagnosis not present

## 2016-09-06 DIAGNOSIS — H1032 Unspecified acute conjunctivitis, left eye: Secondary | ICD-10-CM | POA: Diagnosis not present

## 2016-09-08 DIAGNOSIS — H1045 Other chronic allergic conjunctivitis: Secondary | ICD-10-CM | POA: Diagnosis not present

## 2016-10-06 DIAGNOSIS — R102 Pelvic and perineal pain: Secondary | ICD-10-CM | POA: Diagnosis not present

## 2016-10-06 DIAGNOSIS — R140 Abdominal distension (gaseous): Secondary | ICD-10-CM | POA: Diagnosis not present

## 2016-10-06 DIAGNOSIS — Z87891 Personal history of nicotine dependence: Secondary | ICD-10-CM | POA: Diagnosis not present

## 2016-10-06 DIAGNOSIS — R339 Retention of urine, unspecified: Secondary | ICD-10-CM | POA: Diagnosis not present

## 2016-10-06 DIAGNOSIS — I10 Essential (primary) hypertension: Secondary | ICD-10-CM | POA: Diagnosis not present

## 2016-10-09 DIAGNOSIS — I10 Essential (primary) hypertension: Secondary | ICD-10-CM | POA: Diagnosis not present

## 2016-10-09 DIAGNOSIS — Z466 Encounter for fitting and adjustment of urinary device: Secondary | ICD-10-CM | POA: Diagnosis not present

## 2016-10-09 DIAGNOSIS — Z87891 Personal history of nicotine dependence: Secondary | ICD-10-CM | POA: Diagnosis not present

## 2016-10-09 DIAGNOSIS — Z87448 Personal history of other diseases of urinary system: Secondary | ICD-10-CM | POA: Diagnosis not present

## 2019-04-06 DIAGNOSIS — H1045 Other chronic allergic conjunctivitis: Secondary | ICD-10-CM | POA: Insufficient documentation

## 2019-04-06 DIAGNOSIS — H409 Unspecified glaucoma: Secondary | ICD-10-CM | POA: Insufficient documentation

## 2019-04-06 DIAGNOSIS — Z8719 Personal history of other diseases of the digestive system: Secondary | ICD-10-CM | POA: Insufficient documentation

## 2019-06-07 ENCOUNTER — Ambulatory Visit: Payer: Medicare Other | Admitting: Urology

## 2019-06-08 ENCOUNTER — Ambulatory Visit: Payer: Medicare Other | Admitting: Urology

## 2019-11-24 ENCOUNTER — Encounter: Payer: Self-pay | Admitting: Nurse Practitioner

## 2019-11-28 ENCOUNTER — Ambulatory Visit: Payer: Medicare Other | Admitting: Nurse Practitioner

## 2019-12-04 ENCOUNTER — Ambulatory Visit: Payer: Medicare Other | Attending: Internal Medicine

## 2019-12-04 DIAGNOSIS — Z23 Encounter for immunization: Secondary | ICD-10-CM | POA: Insufficient documentation

## 2019-12-24 ENCOUNTER — Ambulatory Visit: Payer: Medicare Other | Attending: Internal Medicine

## 2019-12-24 DIAGNOSIS — Z23 Encounter for immunization: Secondary | ICD-10-CM | POA: Insufficient documentation

## 2019-12-24 NOTE — Progress Notes (Signed)
   Covid-19 Vaccination Clinic  Name:  Matthew Jordan    MRN: [id number] DOB: [birth year]  12/24/2019  [name] was observed post Covid-19 immunization for 15 minutes without incidence. He was provided with Vaccine Information Sheet and instruction to access the V-Safe system.   [name] was instructed to call 911 with any severe reactions post vaccine: Marland Kitchen Difficulty breathing  . Swelling of your face and throat  . A fast heartbeat  . A bad rash all over your body  . Dizziness and weakness    Immunizations Administered    Name Date Dose VIS Date Route   Pfizer COVID-19 Vaccine 12/24/2019  9:56 AM 0.3 mL 10/26/2019 Intramuscular   Manufacturer: [place]   Lot: [id number]   [place]: [id number]

## 2020-01-15 ENCOUNTER — Emergency Department
Admission: EM | Admit: 2020-01-15 | Discharge: 2020-01-15 | Disposition: A | Discharge status: Home / Self Care | Payer: Medicare Other | Attending: Student | Admitting: Student

## 2020-01-15 ENCOUNTER — Other Ambulatory Visit: Payer: Self-pay

## 2020-01-15 DIAGNOSIS — I10 Essential (primary) hypertension: Secondary | ICD-10-CM | POA: Diagnosis not present

## 2020-01-15 DIAGNOSIS — Z87891 Personal history of nicotine dependence: Secondary | ICD-10-CM | POA: Insufficient documentation

## 2020-01-15 DIAGNOSIS — Z7982 Long term (current) use of aspirin: Secondary | ICD-10-CM | POA: Insufficient documentation

## 2020-01-15 DIAGNOSIS — R339 Retention of urine, unspecified: Secondary | ICD-10-CM | POA: Diagnosis present

## 2020-01-15 DIAGNOSIS — I2510 Atherosclerotic heart disease of native coronary artery without angina pectoris: Secondary | ICD-10-CM | POA: Insufficient documentation

## 2020-01-15 DIAGNOSIS — Z79899 Other long term (current) drug therapy: Secondary | ICD-10-CM | POA: Diagnosis not present

## 2020-01-15 DIAGNOSIS — R39198 Other difficulties with micturition: Secondary | ICD-10-CM | POA: Insufficient documentation

## 2020-01-15 LAB — URINALYSIS, COMPLETE (UACMP) WITH MICROSCOPIC
Bacteria, UA: NONE SEEN
Bilirubin Urine: NEGATIVE
Glucose, UA: NEGATIVE mg/dL
Hgb urine dipstick: NEGATIVE
Ketones, ur: NEGATIVE mg/dL
Leukocytes,Ua: NEGATIVE
Nitrite: NEGATIVE
Protein, ur: NEGATIVE mg/dL
Specific Gravity, Urine: 1.02 (ref 1.005–1.030)
Squamous Epithelial / LPF: NONE SEEN (ref 0–5)
pH: 6 (ref 5.0–8.0)

## 2020-01-15 MED ORDER — TAMSULOSIN HCL 0.4 MG PO CAPS: 0.4000 mg | ORAL_CAPSULE | Freq: Every day | ORAL | 0 refills | Status: AC

## 2020-01-15 NOTE — ED Triage Notes (Signed)
Pt states he ran out of flomax and has been having difficulty passing urine and is wanting a refill.

## 2020-01-15 NOTE — ED Provider Notes (Signed)
[facility] Emergency Department Provider Note  ____________________________________________   First MD Initiated Contact with Patient 01/15/20 0932     (approximate)  I have reviewed the triage vital signs and the nursing notes.  History  Chief Complaint Urinary Retention    HPI Matthew Jordan is a 69 y.o. male with history of BPH, on Flomax daily, who presents emergency department for prescription refill and urinary symptoms in the setting of running out of his Flomax.  Patient states he has been out of his medication for the last 4 days and was unable to obtain a refill.  He has been experiencing recurrence of prior BPH symptoms, including difficulty urinating, some suprapubic pressure, and some mild discomfort with urination.  Symptoms are mild in severity.  No radiation.  No alleviating or aggravating components.  They have been constant over the last several days in the setting of his missed medications.  Denies any fevers or chills or vomiting.  No lower extremity weakness, numbness, tingling.   Past Medical Hx Past Medical History:  Diagnosis Date  . Arthritis   . BPH with urinary obstruction   . CAD (coronary artery disease) Fall 2014   40% stenosis of LAD on cardiac cath  . CAD (coronary artery disease)   . Cardiac arrhythmia   . Cardiac dysrhythmia   . Chest pain   . CHF (congestive heart failure) ([place])   . Dysphagia   . ED (erectile dysfunction)   . Elevated PSA   . GERD (gastroesophageal reflux disease)   . Hypertension   . Irregular heart beat   . Left ventricular hypertrophy Fall 2014   mild concentric  . Prostate finding   . Sleep apnea   . SOB (shortness of breath)   . SOB (shortness of breath)     Problem List Patient Active Problem List   Diagnosis Date Noted  . 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  . History of inguinal hernia repair, bilateral 04/06/2019  . BPH with  obstruction/lower urinary tract symptoms 10/07/2015  . Varicose veins 10/03/2015  . CAD (coronary artery disease)   . Arthritis   . GERD (gastroesophageal reflux disease)   . Elevated PSA   . Left ventricular hypertrophy   . Cardiac dysrhythmia   . Dysphagia   . ED (erectile dysfunction)   . Essential hypertension 01/02/2015    Past Surgical Hx Past Surgical History:  Procedure Laterality Date  . CARDIAC CATHETERIZATION  2014   [place]  . HERNIA REPAIR      Medications Prior to Admission medications   Medication Sig Start Date End Date Taking? Authorizing Provider  aspirin EC 81 MG tablet Take 81 mg by mouth daily.    [provider]  finasteride (PROSCAR) 5 MG tablet Take 1 tablet (5 mg total) by mouth daily. 10/07/15   McGowan, Hunt Oris, PA-C  lisinopril-hydrochlorothiazide (PRINZIDE,ZESTORETIC) 10-12.5 MG tablet TAKE 1 TABLET BY MOUTH EVERY DAY 11/12/15   Lada, Satira Anis, MD  sildenafil (VIAGRA) 100 MG tablet Take 1 tablet (100 mg total) by mouth daily as needed for erectile dysfunction. 10/07/15   Zara Council A, PA-C  tamsulosin (FLOMAX) 0.4 MG CAPS capsule Take 0.4 mg by mouth daily.  12/27/14   [provider]  traMADol (ULTRAM) 50 MG tablet Take 50 mg by mouth. Take 1-2 tabs every 6 hours PRN 12/27/14   [provider]    Allergies Amoxicillin  Family Hx Family History  Problem  Relation Age of Onset  . Hypertension Mother   . Lung disease Mother   . Hypertension Father   . Cancer Father        possible prostate cancer  . Kidney disease Neg Hx     Social Hx Social History   Tobacco Use  . Smoking status: Former Smoker    Packs/day: 0.25    Years: 10.00    Pack years: 2.50    Types: Cigarettes    Quit date: 10/02/1980    Years since quitting: 39.3  . Smokeless tobacco: Never Used  Substance Use Topics  . Alcohol use: Yes    Comment: weekly  . Drug use: No     Review of Systems  Constitutional: Negative for fever,  chills. Eyes: Negative for visual changes. ENT: Negative for sore throat. Cardiovascular: Negative for chest pain. Respiratory: Negative for shortness of breath. Gastrointestinal: Negative for nausea, vomiting.  Genitourinary: Positive for difficulty urinating. Musculoskeletal: Negative for leg swelling. Skin: Negative for rash. Neurological: Negative for headaches.   Physical Exam  Vital Signs: ED Triage Vitals  Enc Vitals Group     BP 01/15/20 0919 (!) 163/88     Pulse Rate 01/15/20 0919 68     Resp 01/15/20 0919 18     Temp 01/15/20 0919 97.7 F (36.5 C)     Temp Source 01/15/20 0919 Oral     SpO2 01/15/20 0919 96 %     Weight 01/15/20 0915 250 lb (113.4 kg)     Height 01/15/20 0915 5\' 10"  (1.778 m)     Head Circumference --      Peak Flow --      Pain Score 01/15/20 0915 4     Pain Loc --      Pain Edu? --      Excl. in [place]? --     Constitutional: Alert and oriented.  Head: Normocephalic. Atraumatic. Eyes: Conjunctivae clear. Sclera anicteric. Nose: No congestion. No rhinorrhea. Mouth/Throat: Wearing mask.  Neck: No stridor.   Cardiovascular: Normal rate, regular rhythm. Extremities well perfused. Respiratory: Normal respiratory effort.  Lungs CTAB. Gastrointestinal: Soft. Non-tender. Non-distended.  Musculoskeletal: No lower extremity edema. No deformities. Neurologic:  Normal speech and language. No gross focal neurologic deficits are appreciated.  BLE strength 5/5 and symmetric.  SILT. Skin: Skin is warm, dry and intact. No rash noted. Psychiatric: Mood and affect are appropriate for situation.   Procedures  Procedure(s) performed (including critical care):  Procedures   Initial Impression / Assessment and Plan / ED Course  69 y.o. male who presents to the ED for medication refill of Flomax.  He has been out for the last 4 days and is been experiencing recurrence of BPH symptoms, including difficulty urinating, some suprapubic pressure and discomfort  with urination.  Ddx: BPH, UTI  Exam is reassuring, with BLE strength 5/5 and symmetric.  PVR 0.  Therefore doubt any acute cord pathology.  We will send urine to evaluate for infection, and plan for refill of his Flomax.  UA negative for infection.  As such, patient stable for discharge, will provide Rx for his Flomax and advised outpatient follow-up.  He is agreeable.   Final Clinical Impression(s) / ED Diagnosis  Final diagnoses:  Difficulty urinating       Note:  This document was prepared using Dragon voice recognition software and may include unintentional dictation errors.   Lilia Pro., MD 01/15/20 1100

## 2020-01-15 NOTE — Discharge Instructions (Signed)
Thank you for letting us take care of you in the emergency department today.   Please continue to take any regular, prescribed medications.   New medications we have prescribed:  - Flomax  Please follow up with: - Your primary care doctor to review your ER visit and follow up on your symptoms. You will need to see your primary doctor for recheck and refill of your medicines.  Please return to the ER for any new or worsening symptoms.

## 2020-01-16 LAB — URINE CULTURE: Culture: <10000 — AB

## 2020-04-17 ENCOUNTER — Encounter: Payer: Self-pay | Admitting: Emergency Medicine

## 2020-04-17 ENCOUNTER — Emergency Department
Admission: EM | Admit: 2020-04-17 | Discharge: 2020-04-17 | Disposition: A | Discharge status: Home / Self Care | Payer: Medicare Other | Attending: Emergency Medicine | Admitting: Emergency Medicine

## 2020-04-17 ENCOUNTER — Emergency Department: Payer: Medicare Other

## 2020-04-17 ENCOUNTER — Other Ambulatory Visit: Payer: Self-pay

## 2020-04-17 DIAGNOSIS — I110 Hypertensive heart disease with heart failure: Secondary | ICD-10-CM | POA: Diagnosis not present

## 2020-04-17 DIAGNOSIS — Z79899 Other long term (current) drug therapy: Secondary | ICD-10-CM | POA: Diagnosis not present

## 2020-04-17 DIAGNOSIS — Z7982 Long term (current) use of aspirin: Secondary | ICD-10-CM | POA: Diagnosis not present

## 2020-04-17 DIAGNOSIS — I2510 Atherosclerotic heart disease of native coronary artery without angina pectoris: Secondary | ICD-10-CM | POA: Diagnosis not present

## 2020-04-17 DIAGNOSIS — I509 Heart failure, unspecified: Secondary | ICD-10-CM | POA: Diagnosis not present

## 2020-04-17 DIAGNOSIS — Z87891 Personal history of nicotine dependence: Secondary | ICD-10-CM | POA: Insufficient documentation

## 2020-04-17 DIAGNOSIS — R0602 Shortness of breath: Secondary | ICD-10-CM | POA: Diagnosis not present

## 2020-04-17 LAB — CBC WITH DIFFERENTIAL/PLATELET
Abs Immature Granulocytes: 0.02 10*3/uL (ref 0.00–0.07)
Basophils Absolute: 0 10*3/uL (ref 0.0–0.1)
Basophils Relative: 0 %
Eosinophils Absolute: 0.2 10*3/uL (ref 0.0–0.5)
Eosinophils Relative: 2 %
HCT: 44.3 % (ref 39.0–52.0)
Hemoglobin: 15.8 g/dL (ref 13.0–17.0)
Immature Granulocytes: 0 %
Lymphocytes Relative: 24 %
Lymphs Abs: 1.8 10*3/uL (ref 0.7–4.0)
MCH: 31.7 pg (ref 26.0–34.0)
MCHC: 35.7 g/dL (ref 30.0–36.0)
MCV: 88.8 fL (ref 80.0–100.0)
Monocytes Absolute: 0.6 10*3/uL (ref 0.1–1.0)
Monocytes Relative: 8 %
Neutro Abs: 5 10*3/uL (ref 1.7–7.7)
Neutrophils Relative %: 66 %
Platelets: 182 10*3/uL (ref 150–400)
RBC: 4.99 MIL/uL (ref 4.22–5.81)
RDW: 12.4 % (ref 11.5–15.5)
WBC: 7.6 10*3/uL (ref 4.0–10.5)
nRBC: 0 % (ref 0.0–0.2)

## 2020-04-17 LAB — COMPREHENSIVE METABOLIC PANEL
ALT: 20 U/L (ref 0–44)
AST: 25 U/L (ref 15–41)
Albumin: 4.3 g/dL (ref 3.5–5.0)
Alkaline Phosphatase: 53 U/L (ref 38–126)
Anion gap: 9 (ref 5–15)
BUN: 18 mg/dL (ref 8–23)
CO2: 26 mmol/L (ref 22–32)
Calcium: 9.2 mg/dL (ref 8.9–10.3)
Chloride: 104 mmol/L (ref 98–111)
Creatinine, Ser: 0.7 mg/dL (ref 0.61–1.24)
GFR calc Af Amer: >60 mL/min (ref >60)
GFR calc non Af Amer: >60 mL/min (ref >60)
Glucose, Bld: 104 mg/dL — ABNORMAL HIGH (ref 70–99)
Potassium: 3.5 mmol/L (ref 3.5–5.1)
Sodium: 139 mmol/L (ref 135–145)
Total Bilirubin: 0.8 mg/dL (ref 0.3–1.2)
Total Protein: 7.3 g/dL (ref 6.5–8.1)

## 2020-04-17 LAB — TROPONIN I (HIGH SENSITIVITY): Troponin I (High Sensitivity): 5 ng/L (ref <18)

## 2020-04-17 NOTE — ED Notes (Signed)
Pt reports when he tries to fall asleep he wakes up gasping for air. Pt states he is not sure if it was his heart or he just stopped breathing. Pt reports now a lot of anxiety and is scared to go to sleep. Pt reports these symptoms started a day or two ago. Pt admits that he stopped taking his BP meds 2 weeks ago because he didn't like how it made him feel. Pt reports after this happened he was scared and he took 2 of his BO meds about 3am.

## 2020-04-17 NOTE — ED Triage Notes (Signed)
Patient ambulatory to triage with steady gait, without difficulty or distress noted, mask in place; pt reports "everytime I try to fall asleep I feel like my heart stops beating or I can't breathe"; pt denies any pain but st cont to feel [facility]

## 2020-04-17 NOTE — ED Notes (Signed)
Pt verbalizes understanding of d/c instructions and follow up. 

## 2020-04-17 NOTE — ED Provider Notes (Signed)
[facility] Emergency Department Provider Note   ____________________________________________    I have reviewed the triage vital signs and the nursing notes.   HISTORY  Chief Complaint Shortness of Breath     HPI Matthew Jordan is a 69 y.o. male with history of coronary artery disease, chest pain, CHF, hypertension who presents with complaints of shortness of breath.  Patient reports last night while he was lying flat on his back trying to sleep he felt mildly short of breath.  He reports that improved this morning as he began his day.  Currently feels well and has no complaints.  He had no chest pain.  Did feel some brief palpitations now resolved.  No fevers or chills or cough.  Has not take anything for this.  He reports that he is stopped taking many of his medications as he is trying to diet and exercise although he does report that he has been having increased salt intake recently.  Past Medical History:  Diagnosis Date  . Arthritis   . BPH with urinary obstruction   . CAD (coronary artery disease) Fall 2014   40% stenosis of LAD on cardiac cath  . CAD (coronary artery disease)   . Cardiac arrhythmia   . Cardiac dysrhythmia   . Chest pain   . CHF (congestive heart failure) ([place])   . Dysphagia   . ED (erectile dysfunction)   . Elevated PSA   . GERD (gastroesophageal reflux disease)   . Hypertension   . Irregular heart beat   . Left ventricular hypertrophy Fall 2014   mild concentric  . Prostate finding   . Sleep apnea   . SOB (shortness of breath)   . SOB (shortness of breath)     Patient Active Problem List   Diagnosis Date Noted  . 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  . History of inguinal hernia repair, bilateral 04/06/2019  . BPH with obstruction/lower urinary tract symptoms 10/07/2015  . Varicose veins 10/03/2015  . CAD (coronary artery disease)   . Arthritis   . GERD  (gastroesophageal reflux disease)   . Elevated PSA   . Left ventricular hypertrophy   . Cardiac dysrhythmia   . Dysphagia   . ED (erectile dysfunction)   . Essential hypertension 01/02/2015    Past Surgical History:  Procedure Laterality Date  . CARDIAC CATHETERIZATION  2014   [place]  . HERNIA REPAIR      Prior to Admission medications   Medication Sig Start Date End Date Taking? Authorizing Provider  aspirin EC 81 MG tablet Take 81 mg by mouth daily.    [provider]  finasteride (PROSCAR) 5 MG tablet Take 1 tablet (5 mg total) by mouth daily. 10/07/15   McGowan, Hunt Oris, PA-C  lisinopril-hydrochlorothiazide (PRINZIDE,ZESTORETIC) 10-12.5 MG tablet TAKE 1 TABLET BY MOUTH EVERY DAY 11/12/15   Lada, Satira Anis, MD  sildenafil (VIAGRA) 100 MG tablet Take 1 tablet (100 mg total) by mouth daily as needed for erectile dysfunction. 10/07/15   Zara Council A, PA-C  tamsulosin (FLOMAX) 0.4 MG CAPS capsule Take 0.4 mg by mouth daily.  12/27/14   [provider]  traMADol (ULTRAM) 50 MG tablet Take 50 mg by mouth. Take 1-2 tabs every 6 hours PRN 12/27/14   [provider]     Allergies Amoxicillin  Family History  Problem Relation Age of Onset  . Hypertension Mother   . Lung disease Mother   .  Hypertension Father   . Cancer Father        possible prostate cancer  . Kidney disease Neg Hx     Social History Social History   Tobacco Use  . Smoking status: Former Smoker    Packs/day: 0.25    Years: 10.00    Pack years: 2.50    Types: Cigarettes    Quit date: 10/02/1980    Years since quitting: 39.5  . Smokeless tobacco: Never Used  Substance Use Topics  . Alcohol use: Yes    Comment: weekly  . Drug use: No    Review of Systems  Constitutional: No fever/chills Eyes: No visual changes.  ENT: No sore throat. Cardiovascular: Denies chest pain. Respiratory: As above Gastrointestinal: No abdominal pain.  No nausea, no vomiting.     Genitourinary: Negative for dysuria. Musculoskeletal: Negative for back pain. Skin: Negative for rash. Neurological: Negative for headaches or weakness   ____________________________________________   PHYSICAL EXAM:  VITAL SIGNS: ED Triage Vitals  Enc Vitals Group     BP 04/17/20 0641 (!) 146/102     Pulse Rate 04/17/20 0641 62     Resp 04/17/20 0641 18     Temp 04/17/20 0641 98.7 F (37.1 C)     Temp Source 04/17/20 0641 Oral     SpO2 04/17/20 0641 97 %     Weight 04/17/20 0640 113.4 kg (250 lb)     Height 04/17/20 0640 1.778 m (5\' 10" )     Head Circumference --      Peak Flow --      Pain Score 04/17/20 0640 0     Pain Loc --      Pain Edu? --      Excl. in [place]? --     Constitutional: Alert and oriented. No acute distress.  Eyes: Conjunctivae are normal.  Head: Atraumatic.  Cardiovascular: Normal rate, regular rhythm. Grossly normal heart sounds.  Good peripheral circulation. Respiratory: Normal respiratory effort.  No retractions. Lungs CTAB. Gastrointestinal: Soft and nontender. No distention.  No CVA tenderness. Genitourinary: deferred Musculoskeletal: No lower extremity tenderness nor edema.  Warm and well perfused Neurologic:  Normal speech and language. No gross focal neurologic deficits are appreciated.  Skin:  Skin is warm, dry and intact. No rash noted. Psychiatric: Mood and affect are normal. Speech and behavior are normal.  ____________________________________________   LABS (all labs ordered are listed, but only abnormal results are displayed)  Labs Reviewed  COMPREHENSIVE METABOLIC PANEL - Abnormal; Notable for the following components:      Result Value   Glucose, Bld 104 (*)    All other components within normal limits  CBC WITH DIFFERENTIAL/PLATELET  TROPONIN I (HIGH SENSITIVITY)  TROPONIN I (HIGH SENSITIVITY)   ____________________________________________  EKG ED ECG REPORT I, Lavonia Drafts, the attending physician, personally viewed  and interpreted this ECG.  Date: 04/17/2020  Rhythm: normal sinus rhythm QRS Axis: normal Intervals: Incomplete right bundle branch block ST/T Wave abnormalities: normal Narrative Interpretation: no evidence of acute ischemia  ____________________________________________  RADIOLOGY  Chest x-ray reviewed by me, apparent scarring right base ____________________________________________   PROCEDURES  Procedure(s) performed: No  Procedures   Critical Care performed: No ____________________________________________   INITIAL IMPRESSION / ASSESSMENT AND PLAN / ED COURSE  Pertinent labs & imaging results that were available during my care of the patient were reviewed by me and considered in my medical decision making (see chart for details).  Patient presents with shortness of breath while lying flat  as described above, now is asymptomatic.  Differential includes PND/CHF, pneumonia, sleep apnea.  Chest x-ray is overall reassuring, I strongly encouraged the patient to start taking his blood pressure medications again and decrease salt intake.  Lab work today is reassuring, EKG is unremarkable.  Given the patient feels so well no indication for admission at this time, appropriate discharge with outpatient follow-up closely with his cardiologist.    ____________________________________________   FINAL CLINICAL IMPRESSION(S) / ED DIAGNOSES  Final diagnoses:  Shortness of breath        Note:  This document was prepared using Dragon voice recognition software and may include unintentional dictation errors.   Lavonia Drafts, MD 04/17/20 [phone]

## 2020-05-30 ENCOUNTER — Other Ambulatory Visit: Payer: Self-pay

## 2020-05-30 ENCOUNTER — Emergency Department
Admission: EM | Admit: 2020-05-30 | Discharge: 2020-05-30 | Disposition: A | Discharge status: Home / Self Care | Payer: Medicare Other | Attending: Emergency Medicine | Admitting: Emergency Medicine

## 2020-05-30 DIAGNOSIS — E860 Dehydration: Secondary | ICD-10-CM | POA: Diagnosis not present

## 2020-05-30 DIAGNOSIS — Z87891 Personal history of nicotine dependence: Secondary | ICD-10-CM | POA: Diagnosis not present

## 2020-05-30 DIAGNOSIS — Z79899 Other long term (current) drug therapy: Secondary | ICD-10-CM | POA: Insufficient documentation

## 2020-05-30 DIAGNOSIS — I110 Hypertensive heart disease with heart failure: Secondary | ICD-10-CM | POA: Insufficient documentation

## 2020-05-30 DIAGNOSIS — R42 Dizziness and giddiness: Secondary | ICD-10-CM | POA: Diagnosis present

## 2020-05-30 DIAGNOSIS — I509 Heart failure, unspecified: Secondary | ICD-10-CM | POA: Diagnosis not present

## 2020-05-30 DIAGNOSIS — I2510 Atherosclerotic heart disease of native coronary artery without angina pectoris: Secondary | ICD-10-CM | POA: Insufficient documentation

## 2020-05-30 LAB — CBC
HCT: 47.6 % (ref 39.0–52.0)
Hemoglobin: 16.6 g/dL (ref 13.0–17.0)
MCH: 31.2 pg (ref 26.0–34.0)
MCHC: 34.9 g/dL (ref 30.0–36.0)
MCV: 89.5 fL (ref 80.0–100.0)
Platelets: 205 10*3/uL (ref 150–400)
RBC: 5.32 MIL/uL (ref 4.22–5.81)
RDW: 12.3 % (ref 11.5–15.5)
WBC: 6.3 10*3/uL (ref 4.0–10.5)
nRBC: 0 % (ref 0.0–0.2)

## 2020-05-30 LAB — BASIC METABOLIC PANEL
Anion gap: 11 (ref 5–15)
BUN: 22 mg/dL (ref 8–23)
CO2: 22 mmol/L (ref 22–32)
Calcium: 9.1 mg/dL (ref 8.9–10.3)
Chloride: 105 mmol/L (ref 98–111)
Creatinine, Ser: 0.99 mg/dL (ref 0.61–1.24)
GFR calc Af Amer: >60 mL/min (ref >60)
GFR calc non Af Amer: >60 mL/min (ref >60)
Glucose, Bld: 186 mg/dL — ABNORMAL HIGH (ref 70–99)
Potassium: 3.6 mmol/L (ref 3.5–5.1)
Sodium: 138 mmol/L (ref 135–145)

## 2020-05-30 LAB — GLUCOSE, CAPILLARY: Glucose-Capillary: 172 mg/dL — ABNORMAL HIGH (ref 70–99)

## 2020-05-30 MED ORDER — SODIUM CHLORIDE 0.9% FLUSH: 3.0000 mL | Freq: Once | INTRAVENOUS | Status: DC

## 2020-05-30 NOTE — ED Provider Notes (Signed)
[facility] Emergency Department Provider Note  ____________________________________________  Time seen: Approximately 3:51 PM  I have reviewed the triage vital signs and the nursing notes.   HISTORY  Chief Complaint Dizziness  encounter completed with Spanish interpreter Ronnald Collum at bedside  HPI Matthew Jordan is a 69 y.o. male with a history of CAD CHF hypertension BPH who comes the ED complaining of lightheadedness fatigue for the past 4 days. Denies syncope or trauma. No fever chills chest pain or shortness of breath. Does prolonged work out in [employer]. Reports that symptoms are provoked whenever he leans down and then stands back up quickly. No vision changes paresthesias or weakness, no headache. Symptoms are intermittent . Reports eating and drinking normally, no vomiting or diarrhea     Past Medical History:  Diagnosis Date  . Arthritis   . BPH with urinary obstruction   . CAD (coronary artery disease) Fall 2014   40% stenosis of LAD on cardiac cath  . CAD (coronary artery disease)   . Cardiac arrhythmia   . Cardiac dysrhythmia   . Chest pain   . CHF (congestive heart failure) ([place])   . Dysphagia   . ED (erectile dysfunction)   . Elevated PSA   . GERD (gastroesophageal reflux disease)   . Hypertension   . Irregular heart beat   . Left ventricular hypertrophy Fall 2014   mild concentric  . Prostate finding   . Sleep apnea   . SOB (shortness of breath)   . SOB (shortness of breath)      Patient Active Problem List   Diagnosis Date Noted  . 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  . History of inguinal hernia repair, bilateral 04/06/2019  . BPH with obstruction/lower urinary tract symptoms 10/07/2015  . Varicose veins 10/03/2015  . CAD (coronary artery disease)   . Arthritis   . GERD (gastroesophageal reflux disease)   . Elevated PSA   . Left ventricular hypertrophy   . Cardiac dysrhythmia    . Dysphagia   . ED (erectile dysfunction)   . Essential hypertension 01/02/2015     Past Surgical History:  Procedure Laterality Date  . CARDIAC CATHETERIZATION  2014   [place]  . HERNIA REPAIR       Prior to Admission medications   Medication Sig Start Date End Date Taking? Authorizing Provider  aspirin EC 81 MG tablet Take 81 mg by mouth daily.    [provider]  finasteride (PROSCAR) 5 MG tablet Take 1 tablet (5 mg total) by mouth daily. 10/07/15   McGowan, Hunt Oris, PA-C  lisinopril-hydrochlorothiazide (PRINZIDE,ZESTORETIC) 10-12.5 MG tablet TAKE 1 TABLET BY MOUTH EVERY DAY 11/12/15   Lada, Satira Anis, MD  sildenafil (VIAGRA) 100 MG tablet Take 1 tablet (100 mg total) by mouth daily as needed for erectile dysfunction. 10/07/15   Zara Council A, PA-C  tamsulosin (FLOMAX) 0.4 MG CAPS capsule Take 0.4 mg by mouth daily.  12/27/14   [provider]  traMADol (ULTRAM) 50 MG tablet Take 50 mg by mouth. Take 1-2 tabs every 6 hours PRN 12/27/14   [provider]     Allergies Amoxicillin   Family History  Problem Relation Age of Onset  . Hypertension Mother   . Lung disease Mother   . Hypertension Father   . Cancer Father        possible prostate cancer  . Kidney disease Neg Hx     Social History Social  History   Tobacco Use  . Smoking status: Former Smoker    Packs/day: 0.25    Years: 10.00    Pack years: 2.50    Types: Cigarettes    Quit date: 10/02/1980    Years since quitting: 39.6  . Smokeless tobacco: Never Used  Vaping Use  . Vaping Use: Never used  Substance Use Topics  . Alcohol use: Yes    Comment: weekly  . Drug use: No    Review of Systems  Constitutional:   No fever or chills.  ENT:   No sore throat. No rhinorrhea. Cardiovascular:   No chest pain or syncope. Respiratory:   No dyspnea or cough. Gastrointestinal:   Negative for abdominal pain, vomiting and diarrhea.  Musculoskeletal:   Negative for focal pain  or swelling All other systems reviewed and are negative except as documented above in ROS and HPI.  ____________________________________________   PHYSICAL EXAM:  VITAL SIGNS: ED Triage Vitals [05/30/20 1143]  Enc Vitals Group     BP 102/75     Pulse Rate 81     Resp 18     Temp 97.9 F (36.6 C)     Temp src      SpO2 94 %     Weight 250 lb (113.4 kg)     Height 5\' 10"  (1.778 m)     Head Circumference      Peak Flow      Pain Score 0     Pain Loc      Pain Edu?      Excl. in [place]?     Vital signs reviewed, nursing assessments reviewed.   Constitutional:   Alert and oriented. Non-toxic appearance. Eyes:   Conjunctivae are normal. EOMI. PERRL. ENT      Head:   Normocephalic and atraumatic.      Nose:   Wearing a mask.      Mouth/Throat:   Wearing a mask.      Neck:   No meningismus. Full ROM. Hematological/Lymphatic/Immunilogical:   No cervical lymphadenopathy. Cardiovascular:   RRR. Symmetric bilateral radial and DP pulses.  No murmurs. Cap refill less than 2 seconds. Respiratory:   Normal respiratory effort without tachypnea/retractions. Breath sounds are clear and equal bilaterally. No wheezes/rales/rhonchi. Gastrointestinal:   Soft and nontender. Non distended. There is no CVA tenderness.  No rebound, rigidity, or guarding.  Musculoskeletal:   Normal range of motion in all extremities. No joint effusions.  No lower extremity tenderness.  No edema. Neurologic:   Normal speech and language.  Cranial nerves III through XII intact, ambulatory Motor grossly intact. No acute focal neurologic deficits are appreciated.  Skin:    Skin is warm, dry and intact. No rash noted.  No petechiae, purpura, or bullae.  ____________________________________________    LABS (pertinent positives/negatives) (all labs ordered are listed, but only abnormal results are displayed) Labs Reviewed  BASIC METABOLIC PANEL - Abnormal; Notable for the following components:      Result Value    Glucose, Bld 186 (*)    All other components within normal limits  GLUCOSE, CAPILLARY - Abnormal; Notable for the following components:   Glucose-Capillary 172 (*)    All other components within normal limits  CBC  URINALYSIS, COMPLETE (UACMP) WITH MICROSCOPIC  CBG MONITORING, ED   ____________________________________________   EKG  Interpreted by me normal sinus rhythm rate of 78. Left axis, normal intervals. Normal QRS ST segments and T waves  ____________________________________________    RADIOLOGY  No results found.  ____________________________________________   PROCEDURES Procedures  ____________________________________________  DIFFERENTIAL DIAGNOSIS   dehydration, electrolyte abnormality, fatigue, heat related illness  CLINICAL IMPRESSION / ASSESSMENT AND PLAN / ED COURSE  Medications ordered in the ED: Medications  sodium chloride flush (NS) 0.9 % injection 3 mL (3 mLs Intravenous Not Given 05/30/20 1434)    Pertinent labs & imaging results that were available during my care of the patient were reviewed by me and considered in my medical decision making (see chart for details).  Matthew Jordan was evaluated in Emergency Department on 05/30/2020 for the symptoms described in the history of present illness. He was evaluated in the context of the global COVID-19 pandemic, which necessitated consideration that the patient might be at risk for infection with the SARS-CoV-2 virus that causes COVID-19. Institutional protocols and algorithms that pertain to the evaluation of patients at risk for COVID-19 are in a state of rapid change based on information released by regulatory bodies including the CDC and federal and state organizations. These policies and algorithms were followed during the patient's care in the ED.   patient complains of lightheadedness with changes of position. Vital signs are normal, exam is reassuring. Orthostatics unremarkable. Labs are  unremarkable. Will plan for oral hydration for dehydration related the heat exposure, stable for discharge home.      ____________________________________________   FINAL CLINICAL IMPRESSION(S) / ED DIAGNOSES    Final diagnoses:  Dehydration  Lightheadedness     ED Discharge Orders    None      Portions of this note were generated with dragon dictation software. Dictation errors may occur despite best attempts at proofreading.   Carrie Mew, MD 05/30/20 [phone]

## 2020-05-30 NOTE — ED Notes (Signed)
Patient given 32oz of bottled water to drink.

## 2020-05-30 NOTE — Discharge Instructions (Signed)
Your lab test today were all okay, and your examination was reassuring. Drink lots of water and avoid prolonged exposure to hot weather.

## 2020-05-30 NOTE — ED Triage Notes (Addendum)
FIRST NURSE NOTE:  Pt here with family, reports pt has had low BP, fatigue, weakness,  Reports systolic numbers have been in 90s.   On arrival pt ambulatory without difficulty, A/O x4 speaks spanish. Pt placed in wheelchair on arrival.  Interpreter requested.

## 2020-05-30 NOTE — ED Triage Notes (Signed)
Pt comes via POV from home with c/o dizziness. Pt states he had similar symptoms a month ago.  Pt states dizziness, hypotension and weakness. Pt states he feels tired and doesn't feel like doing anything.

## 2020-09-15 DIAGNOSIS — Q2546 Tortuous aortic arch: Secondary | ICD-10-CM

## 2020-09-15 HISTORY — DX: Tortuous aortic arch: Q25.46

## 2020-09-24 ENCOUNTER — Other Ambulatory Visit: Payer: Self-pay

## 2020-09-24 ENCOUNTER — Emergency Department: Payer: Medicare Other

## 2020-09-24 ENCOUNTER — Observation Stay
Admission: EM | Admit: 2020-09-24 | Discharge: 2020-09-25 | Disposition: A | Discharge status: Home / Self Care | Payer: Medicare Other | Attending: Obstetrics and Gynecology | Admitting: Obstetrics and Gynecology

## 2020-09-24 ENCOUNTER — Encounter: Payer: Self-pay | Admitting: Emergency Medicine

## 2020-09-24 DIAGNOSIS — I2510 Atherosclerotic heart disease of native coronary artery without angina pectoris: Secondary | ICD-10-CM | POA: Insufficient documentation

## 2020-09-24 DIAGNOSIS — R0602 Shortness of breath: Secondary | ICD-10-CM | POA: Diagnosis present

## 2020-09-24 DIAGNOSIS — R0600 Dyspnea, unspecified: Secondary | ICD-10-CM

## 2020-09-24 DIAGNOSIS — I4891 Unspecified atrial fibrillation: Secondary | ICD-10-CM | POA: Diagnosis not present

## 2020-09-24 DIAGNOSIS — I110 Hypertensive heart disease with heart failure: Secondary | ICD-10-CM | POA: Diagnosis not present

## 2020-09-24 DIAGNOSIS — R7989 Other specified abnormal findings of blood chemistry: Secondary | ICD-10-CM | POA: Diagnosis not present

## 2020-09-24 DIAGNOSIS — I5031 Acute diastolic (congestive) heart failure: Secondary | ICD-10-CM

## 2020-09-24 DIAGNOSIS — Z20822 Contact with and (suspected) exposure to covid-19: Secondary | ICD-10-CM | POA: Insufficient documentation

## 2020-09-24 DIAGNOSIS — R079 Chest pain, unspecified: Secondary | ICD-10-CM

## 2020-09-24 DIAGNOSIS — I509 Heart failure, unspecified: Secondary | ICD-10-CM

## 2020-09-24 DIAGNOSIS — I5021 Acute systolic (congestive) heart failure: Secondary | ICD-10-CM | POA: Insufficient documentation

## 2020-09-24 DIAGNOSIS — Z87891 Personal history of nicotine dependence: Secondary | ICD-10-CM | POA: Insufficient documentation

## 2020-09-24 DIAGNOSIS — Z79899 Other long term (current) drug therapy: Secondary | ICD-10-CM | POA: Diagnosis not present

## 2020-09-24 DIAGNOSIS — Z23 Encounter for immunization: Secondary | ICD-10-CM | POA: Insufficient documentation

## 2020-09-24 DIAGNOSIS — Z7982 Long term (current) use of aspirin: Secondary | ICD-10-CM | POA: Diagnosis not present

## 2020-09-24 LAB — COMPREHENSIVE METABOLIC PANEL
ALT: 43 U/L (ref 0–44)
AST: 39 U/L (ref 15–41)
Albumin: 3.6 g/dL (ref 3.5–5.0)
Alkaline Phosphatase: 67 U/L (ref 38–126)
Anion gap: 7 (ref 5–15)
BUN: 18 mg/dL (ref 8–23)
CO2: 25 mmol/L (ref 22–32)
Calcium: 8.4 mg/dL — ABNORMAL LOW (ref 8.9–10.3)
Chloride: 108 mmol/L (ref 98–111)
Creatinine, Ser: 0.7 mg/dL (ref 0.61–1.24)
GFR, Estimated: >60 mL/min (ref >60)
Glucose, Bld: 154 mg/dL — ABNORMAL HIGH (ref 70–99)
Potassium: 3.6 mmol/L (ref 3.5–5.1)
Sodium: 140 mmol/L (ref 135–145)
Total Bilirubin: 0.5 mg/dL (ref 0.3–1.2)
Total Protein: 6.5 g/dL (ref 6.5–8.1)

## 2020-09-24 LAB — BRAIN NATRIURETIC PEPTIDE: B Natriuretic Peptide: 530.9 pg/mL — ABNORMAL HIGH (ref 0.0–100.0)

## 2020-09-24 LAB — CBC WITH DIFFERENTIAL/PLATELET
Abs Immature Granulocytes: 0.04 10*3/uL (ref 0.00–0.07)
Basophils Absolute: 0 10*3/uL (ref 0.0–0.1)
Basophils Relative: 1 %
Eosinophils Absolute: 0.1 10*3/uL (ref 0.0–0.5)
Eosinophils Relative: 2 %
HCT: 42.9 % (ref 39.0–52.0)
Hemoglobin: 15.1 g/dL (ref 13.0–17.0)
Immature Granulocytes: 1 %
Lymphocytes Relative: 32 %
Lymphs Abs: 2.1 10*3/uL (ref 0.7–4.0)
MCH: 31.7 pg (ref 26.0–34.0)
MCHC: 35.2 g/dL (ref 30.0–36.0)
MCV: 90.1 fL (ref 80.0–100.0)
Monocytes Absolute: 0.6 10*3/uL (ref 0.1–1.0)
Monocytes Relative: 8 %
Neutro Abs: 3.8 10*3/uL (ref 1.7–7.7)
Neutrophils Relative %: 56 %
Platelets: 179 10*3/uL (ref 150–400)
RBC: 4.76 MIL/uL (ref 4.22–5.81)
RDW: 12.4 % (ref 11.5–15.5)
WBC: 6.6 10*3/uL (ref 4.0–10.5)
nRBC: 0 % (ref 0.0–0.2)

## 2020-09-24 LAB — RESPIRATORY PANEL BY RT PCR (FLU A&B, COVID)
Influenza A by PCR: NEGATIVE
Influenza B by PCR: NEGATIVE
SARS Coronavirus 2 by RT PCR: NEGATIVE

## 2020-09-24 LAB — TROPONIN I (HIGH SENSITIVITY)
Troponin I (High Sensitivity): 79 ng/L — ABNORMAL HIGH (ref <18)
Troponin I (High Sensitivity): 70 ng/L — ABNORMAL HIGH (ref <18)
Troponin I (High Sensitivity): 63 ng/L — ABNORMAL HIGH (ref <18)
Troponin I (High Sensitivity): 53 ng/L — ABNORMAL HIGH (ref <18)

## 2020-09-24 LAB — HEMOGLOBIN A1C
Hgb A1c MFr Bld: 5.8 % — ABNORMAL HIGH (ref 4.8–5.6)
Mean Plasma Glucose: 119.76 mg/dL

## 2020-09-24 LAB — MAGNESIUM: Magnesium: 2.2 mg/dL (ref 1.7–2.4)

## 2020-09-24 LAB — TSH: TSH: 5.284 u[IU]/mL — ABNORMAL HIGH (ref 0.350–4.500)

## 2020-09-24 MED ORDER — LISINOPRIL 10 MG PO TABS: 10.0000 mg | ORAL_TABLET | Freq: Every day | ORAL | Status: DC

## 2020-09-24 MED ORDER — IOHEXOL 350 MG/ML SOLN
100.0000 mL | Freq: Once | INTRAVENOUS | Status: AC | PRN
  Administered 2020-09-24: 100 mL via INTRAVENOUS

## 2020-09-24 MED ORDER — DILTIAZEM HCL-DEXTROSE 125-5 MG/125ML-% IV SOLN (PREMIX): 5.0000 mg/h | INTRAVENOUS | Status: DC

## 2020-09-24 MED ORDER — DILTIAZEM HCL 25 MG/5ML IV SOLN
10.0000 mg | Freq: Once | INTRAVENOUS | Status: AC
  Administered 2020-09-24: 10 mg via INTRAVENOUS
  Filled 2020-09-24: qty 5

## 2020-09-24 MED ORDER — ASPIRIN EC 81 MG PO TBEC
81.0000 mg | DELAYED_RELEASE_TABLET | Freq: Every day | ORAL | Status: DC
  Administered 2020-09-24 – 2020-09-25 (×2): 81 mg via ORAL
  Filled 2020-09-24 (×2): qty 1

## 2020-09-24 MED ORDER — LISINOPRIL-HYDROCHLOROTHIAZIDE 10-12.5 MG PO TABS: 1.0000 | ORAL_TABLET | Freq: Every day | ORAL | Status: DC

## 2020-09-24 MED ORDER — APIXABAN 5 MG PO TABS
5.0000 mg | ORAL_TABLET | Freq: Two times a day (BID) | ORAL | Status: DC
  Administered 2020-09-24 – 2020-09-25 (×3): 5 mg via ORAL
  Filled 2020-09-24 (×3): qty 1

## 2020-09-24 MED ORDER — ONDANSETRON HCL 4 MG/2ML IJ SOLN: 4.0000 mg | Freq: Four times a day (QID) | INTRAMUSCULAR | Status: DC | PRN

## 2020-09-24 MED ORDER — ATORVASTATIN CALCIUM 20 MG PO TABS
40.0000 mg | ORAL_TABLET | Freq: Every day | ORAL | Status: DC
  Administered 2020-09-24 – 2020-09-25 (×2): 40 mg via ORAL
  Filled 2020-09-24 (×2): qty 2

## 2020-09-24 MED ORDER — FUROSEMIDE 10 MG/ML IJ SOLN
40.0000 mg | Freq: Two times a day (BID) | INTRAMUSCULAR | Status: DC
  Administered 2020-09-24 – 2020-09-25 (×2): 40 mg via INTRAVENOUS
  Filled 2020-09-24 (×2): qty 4

## 2020-09-24 MED ORDER — ZOLPIDEM TARTRATE 5 MG PO TABS
5.0000 mg | ORAL_TABLET | Freq: Every evening | ORAL | Status: DC | PRN
  Administered 2020-09-24: 5 mg via ORAL
  Filled 2020-09-24: qty 1

## 2020-09-24 MED ORDER — TAMSULOSIN HCL 0.4 MG PO CAPS
0.4000 mg | ORAL_CAPSULE | Freq: Every day | ORAL | Status: DC
  Administered 2020-09-24 – 2020-09-25 (×2): 0.4 mg via ORAL
  Filled 2020-09-24 (×2): qty 1

## 2020-09-24 MED ORDER — FUROSEMIDE 10 MG/ML IJ SOLN
20.0000 mg | Freq: Once | INTRAMUSCULAR | Status: AC
  Administered 2020-09-24: 20 mg via INTRAVENOUS
  Filled 2020-09-24: qty 4

## 2020-09-24 MED ORDER — FINASTERIDE 5 MG PO TABS
5.0000 mg | ORAL_TABLET | Freq: Every day | ORAL | Status: DC
  Administered 2020-09-24 – 2020-09-25 (×2): 5 mg via ORAL
  Filled 2020-09-24 (×2): qty 1

## 2020-09-24 MED ORDER — ACETAMINOPHEN 325 MG PO TABS: 650.0000 mg | ORAL_TABLET | ORAL | Status: DC | PRN

## 2020-09-24 MED ORDER — INFLUENZA VAC A&B SA ADJ QUAD 0.5 ML IM PRSY
0.5000 mL | PREFILLED_SYRINGE | INTRAMUSCULAR | Status: AC
  Administered 2020-09-25: 0.5 mL via INTRAMUSCULAR
  Filled 2020-09-24: qty 0.5

## 2020-09-24 MED ORDER — DILTIAZEM HCL-DEXTROSE 125-5 MG/125ML-% IV SOLN (PREMIX)
5.0000 mg/h | INTRAVENOUS | Status: DC
  Administered 2020-09-24: 5 mg/h via INTRAVENOUS
  Filled 2020-09-24: qty 125

## 2020-09-24 MED ORDER — DILTIAZEM HCL ER COATED BEADS 120 MG PO CP24
240.0000 mg | ORAL_CAPSULE | Freq: Every day | ORAL | Status: DC
  Administered 2020-09-24 – 2020-09-25 (×2): 240 mg via ORAL
  Filled 2020-09-24: qty 1
  Filled 2020-09-24: qty 2

## 2020-09-24 MED ORDER — HYDROCHLOROTHIAZIDE 12.5 MG PO CAPS: 12.5000 mg | ORAL_CAPSULE | Freq: Every day | ORAL | Status: DC

## 2020-09-24 MED ORDER — ALPRAZOLAM 0.25 MG PO TABS: 0.2500 mg | ORAL_TABLET | Freq: Two times a day (BID) | ORAL | Status: DC | PRN

## 2020-09-24 NOTE — ED Provider Notes (Addendum)
[facility] Emergency Department Provider Note   ____________________________________________   First MD Initiated Contact with Patient 09/24/20 [phone]     (approximate)  I have reviewed the triage vital signs and the nursing notes.   HISTORY  Chief Complaint Chest Pain    HPI Matthew Jordan is a 69 y.o. male who reports shortness of breath and some tightness in his upper chest today.  He ran out of his lisinopril a couple days ago and has not taken any since then.  He is not worse with exertion and does not have any pleuritic chest pain or discomfort.  He has a known 40% lesion of the LAD on previous cath and chest x-ray tonight was read as possible dilatation of the ascending aorta compared to old CT scan.   Patient's past history is also significant for CHF.  He has had an "irregular heartbeat" or palpitations but no diagnosis of atrial fibrillation.      Past Medical History:  Diagnosis Date  . Arthritis   . BPH with urinary obstruction   . CAD (coronary artery disease) Fall 2014   40% stenosis of LAD on cardiac cath  . CAD (coronary artery disease)   . Cardiac arrhythmia   . Cardiac dysrhythmia   . Chest pain   . CHF (congestive heart failure) ([place])   . Dysphagia   . ED (erectile dysfunction)   . Elevated PSA   . GERD (gastroesophageal reflux disease)   . Hypertension   . Irregular heart beat   . Left ventricular hypertrophy Fall 2014   mild concentric  . Prostate finding   . Sleep apnea   . SOB (shortness of breath)   . SOB (shortness of breath)     Patient Active Problem List   Diagnosis Date Noted  . 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  . History of inguinal hernia repair, bilateral 04/06/2019  . BPH with obstruction/lower urinary tract symptoms 10/07/2015  . Varicose veins 10/03/2015  . CAD (coronary artery disease)   . Arthritis   . GERD (gastroesophageal reflux disease)   .  Elevated PSA   . Left ventricular hypertrophy   . Cardiac dysrhythmia   . Dysphagia   . ED (erectile dysfunction)   . Essential hypertension 01/02/2015    Past Surgical History:  Procedure Laterality Date  . CARDIAC CATHETERIZATION  2014   [place]  . HERNIA REPAIR      Prior to Admission medications   Medication Sig Start Date End Date Taking? Authorizing Provider  aspirin EC 81 MG tablet Take 81 mg by mouth daily.    [provider]  finasteride (PROSCAR) 5 MG tablet Take 1 tablet (5 mg total) by mouth daily. 10/07/15   McGowan, Hunt Oris, PA-C  lisinopril-hydrochlorothiazide (PRINZIDE,ZESTORETIC) 10-12.5 MG tablet TAKE 1 TABLET BY MOUTH EVERY DAY 11/12/15   Lada, Satira Anis, MD  sildenafil (VIAGRA) 100 MG tablet Take 1 tablet (100 mg total) by mouth daily as needed for erectile dysfunction. 10/07/15   Zara Council A, PA-C  tamsulosin (FLOMAX) 0.4 MG CAPS capsule Take 0.4 mg by mouth daily.  12/27/14   [provider]  traMADol (ULTRAM) 50 MG tablet Take 50 mg by mouth. Take 1-2 tabs every 6 hours PRN 12/27/14   [provider]    Allergies Amoxicillin  Family History  Problem Relation Age of Onset  . Hypertension Mother   . Lung disease Mother   . Hypertension Father   .  Cancer Father        possible prostate cancer  . Kidney disease Neg Hx     Social History Social History   Tobacco Use  . Smoking status: Former Smoker    Packs/day: 0.25    Years: 10.00    Pack years: 2.50    Types: Cigarettes    Quit date: 10/02/1980    Years since quitting: 40.0  . Smokeless tobacco: Never Used  Vaping Use  . Vaping Use: Never used  Substance Use Topics  . Alcohol use: Yes    Comment: weekly  . Drug use: No    Review of Systems  Constitutional: No fever/chills Eyes: No visual changes. ENT: No sore throat. Cardiovascular:  chest pain. Respiratory:  shortness of breath. Gastrointestinal: No abdominal pain.  No nausea, no vomiting.  No  diarrhea.  No constipation. Genitourinary: Negative for dysuria. Musculoskeletal: Negative for back pain. Skin: Negative for rash. Neurological: Negative for headaches, focal weakness   ____________________________________________   PHYSICAL EXAM:  VITAL SIGNS: ED Triage Vitals  Enc Vitals Group     BP 09/24/20 0218 107/69     Pulse Rate 09/24/20 0218 (!) 106     Resp 09/24/20 0218 20     Temp 09/24/20 0218 98 F (36.7 C)     Temp Source 09/24/20 0218 Oral     SpO2 09/24/20 0218 97 %     Weight 09/24/20 0218 250 lb (113.4 kg)     Height 09/24/20 0218 5\' 10"  (1.778 m)     Head Circumference --      Peak Flow --      Pain Score 09/24/20 0219 6     Pain Loc --      Pain Edu? --      Excl. in [place]? --     Constitutional: Alert and oriented. Well appearing and in no acute distress. Eyes: Conjunctivae are normal. PER Head: Atraumatic. Nose: No congestion/rhinnorhea. Mouth/Throat: Mucous membranes are moist.  Oropharynx non-erythematous. Neck: No stridor.   Cardiovascular: Normal rate, irregular rhythm. Grossly normal heart sounds.  Good peripheral circulation. Respiratory: Normal respiratory effort.  No retractions. Lungs CTAB. Gastrointestinal: Soft and nontender. No distention. No abdominal bruits. No CVA tenderness. Musculoskeletal: No lower extremity tenderness nor edema.  Neurologic:  Normal speech and language. No gross focal neurologic deficits are appreciated.  Skin:  Skin is warm, dry and intact. No rash noted.   ____________________________________________   LABS (all labs ordered are listed, but only abnormal results are displayed)  Labs Reviewed  COMPREHENSIVE METABOLIC PANEL - Abnormal; Notable for the following components:      Result Value   Glucose, Bld 154 (*)    Calcium 8.4 (*)    All other components within normal limits  BRAIN NATRIURETIC PEPTIDE - Abnormal; Notable for the following components:   B Natriuretic Peptide 530.9 (*)    All other  components within normal limits  TROPONIN I (HIGH SENSITIVITY) - Abnormal; Notable for the following components:   Troponin I (High Sensitivity) 79 (*)    All other components within normal limits  TROPONIN I (HIGH SENSITIVITY) - Abnormal; Notable for the following components:   Troponin I (High Sensitivity) 70 (*)    All other components within normal limits  RESPIRATORY PANEL BY RT PCR (FLU A&B, COVID)  CBC WITH DIFFERENTIAL/PLATELET  TSH   ____________________________________________  EKG  EKG read interpreted by me shows A. fib at a rate of 119 left axis no acute ST-T wave changes  and no change in the QRS or T waves or segments since July of this year.  The A. fib however has not been seen before. ____________________________________________  RADIOLOGY Gertha Calkin, personally viewed and evaluated these images (plain radiographs) as part of my medical decision making, as well as reviewing the written report by the radiologist.  ED MD interpretation: Chest x-ray read by radiology reviewed by me may show some increased size of the a sending aorta. CT read by radiology reviewed by me does not show an aneurysm. Official radiology report(s): DG Chest 2 View  Result Date: 09/24/2020 CLINICAL DATA:  Chest pain, shortness of breath and chest pressure EXAM: CHEST - 2 VIEW COMPARISON:  Radiograph 04/17/2020, CT 12/27/2014 FINDINGS: Low volumes and basilar atelectatic changes. Right lower lobe reticular opacities and chronic pleural thickening are similar to comparison radiograph and likely reflect a region of pleuroparenchymal scarring. No new consolidative opacity is evident. While the cardiomediastinal contours are unchanged from comparison radiograph with a calcified tortuous aorta. Prominence of the ascending aorta and arch on the lateral view is somewhat more conspicuous than on more remote comparison CT. Remaining cardiomediastinal contours are unremarkable. No acute osseous or soft  tissue abnormality. Degenerative changes are present in the imaged spine and shoulders. IMPRESSION: 1. Low lung volumes with basilar atelectasis. 2. Right basilar pleuroparenchymal scarring. 3. Prominence of the ascending aorta and arch on the lateral view is more conspicuous than on remote comparison CT though similar to more recent comparison radiograph. Could reflect ascending aortic aneurysm or dilation and given acute symptoms may warrant further evaluation with CT angiography. Electronically Signed   By: Lovena Le M.D.   On: 09/24/2020 02:56   CT ANGIO CHEST AORTA W/CM & OR WO/CM  Result Date: 09/24/2020 CLINICAL DATA:  Chest pain with nonspecific x-ray possibly showing dilated aorta. EXAM: CT ANGIOGRAPHY CHEST WITH CONTRAST TECHNIQUE: Multidetector CT imaging of the chest was performed using the standard protocol during bolus administration of intravenous contrast. Multiplanar CT image reconstructions and MIPs were obtained to evaluate the vascular anatomy. CONTRAST:  174mL OMNIPAQUE IOHEXOL 350 MG/ML SOLN COMPARISON:  Chest x-ray from earlier today and chest CT 12/27/2014 FINDINGS: Cardiovascular: Normal heart size. No pericardial effusion. Aortic tortuosity. No intramural hematoma or dissection of the aorta. No aortic dissection. Atheromatous calcification of the coronary arteries which is multifocal in the left and right circulation. Essentially non-opacified pulmonary arteries. Mediastinum/Nodes: Negative for adenopathy or mass. Lungs/Pleura: There is no edema, consolidation, effusion, or pneumothorax. Motion artifact at the apices. Upper Abdomen: 2 calcified gallstones. No superimposed inflammation. Musculoskeletal: No acute or aggressive finding. Thoracic spondylosis Review of the MIP images confirms the above findings. IMPRESSION: 1. No evidence of acute aortic syndrome. 2. The chest x-ray appearance is related to aortic tortuosity rather than aneurysm. 3. Multifocal coronary atherosclerotic  calcification. 4. Cholelithiasis Electronically Signed   By: Monte Fantasia M.D.   On: 09/24/2020 04:32    ____________________________________________   PROCEDURES  Procedure(s) performed (including Critical Care):  Procedures   ____________________________________________   INITIAL IMPRESSION / ASSESSMENT AND PLAN / ED COURSE  Patient's troponin is somewhat elevated and his aorta may be dilated.  We will get a CT angio of the aorta to see what is going on with that and repeat the troponin.     ----------------------------------------- 4:41 AM on 09/24/2020 -----------------------------------------  CT does not show an aneurysm.  Patient however has new onset A. fib with some mild RVR while he is still.  He  has dyspnea with an elevated BNP over 500.  These are all new findings.  I will add a TSH to his lab work and I think we should get him in the hospital to further evaluate the causes of his A. fib.  No apparent pulmonary emboli were seen on the CT.  His troponin was fairly elevated at 79 with a second troponin was somewhat lower at 70.  Is unlikely he had an acute MI today although possibly he could have had something a few days ago.  We will need to get an echocardiogram to evaluate his valves and heart contraction make sure there are no blood clots.  He will probably have to be started on a better blood thinner then the aspirin that he is currently taking.         ____________________________________________   FINAL CLINICAL IMPRESSION(S) / ED DIAGNOSES  Final diagnoses:  New onset atrial fibrillation (HCC)  Nonspecific chest pain  Elevated brain natriuretic peptide (BNP) level  Dyspnea, unspecified type     ED Discharge Orders    None      *Please note:  Matthew Jordan was evaluated in Emergency Department on 09/24/2020 for the symptoms described in the history of present illness. He was evaluated in the context of the global COVID-19 pandemic, which  necessitated consideration that the patient might be at risk for infection with the SARS-CoV-2 virus that causes COVID-19. Institutional protocols and algorithms that pertain to the evaluation of patients at risk for COVID-19 are in a state of rapid change based on information released by regulatory bodies including the CDC and federal and state organizations. These policies and algorithms were followed during the patient's care in the ED.  Some ED evaluations and interventions may be delayed as a result of limited staffing during and the pandemic.*   Note:  This document was prepared using Dragon voice recognition software and may include unintentional dictation errors.    Nena Polio, MD 09/24/20 0443 ----------------------------------------- 5:12 AM on 09/24/2020 -----------------------------------------  Patient's heart rate which was running between 90 and 120 is now running between 110 and 150.  I will give him a small bolus of diltiazem and started drip.  I am attempting to call Dr. Audery Amel let him know.  We should be report this patient on progressive care though.   Nena Polio, MD 09/24/20 [phone]

## 2020-09-24 NOTE — Progress Notes (Addendum)
PROGRESS NOTE    DAVIEL Jordan  RKY:[id number] DOB: 08-Apr-1951 DOA: 09/24/2020 PCP: Patient, No Pcp Per  Outpatient Specialists: [facility]    Brief Narrative:   Matthew Jordan  is a 69 y.o. male with a known history of coronary artery disease, CHF, GERD, hypertension and sleep apnea, presented to the emergency room with acute onset of intermittent chest pain over the last day felt as palpitations with associated dyspnea, orthopnea paroxysmal nocturnal dyspnea over the last 3 days and generalized fatigue and weakness.  He had nausea and vomiting once while going to radiology department.  No fever or chills.  No dysuria, oliguria or hematuria or flank pain.  Upon presentation to the emergency room, heart rate was 106 with otherwise normal vital signs.  Heart rate has gone up to 115-120 on monitor with atrial fibrillation.  Labs revealed potassium of 3.6 and normal CMP.  BNP was 530.9 and high-sensitivity troponin I was 79 later 70.  CBC was unremarkable.  TSH was 5.28 two-view chest x-ray showed low lung volumes with basilar atelectasis and right basilar pleural-parenchymal scarring and prominent ascending aorta and arch with recommendation for CT angiogram to assess for ascending aortic aneurysm or dilatation.  Chest CTA revealed no evidence for acute aortic syndrome or aneurysm.  Chest x-ray appearance was thought to be related to aortic tortuosity rather than aneurysm.  It showed multifocal coronary atherosclerotic calcification and cholelithiasis.  The patient was given 20 mg of IV Lasix, 10 mg of IV Cardizem and was later started on IV Cardizem drip.  He will be admitted to progressive unit bed for further evaluation and management.   Assessment & Plan:   Active Problems:   Acute CHF (congestive heart failure) (HCC)   Paroxysmal atrial fibrillation with rapid ventricular response and associated chest pain. -The patient will be admitted to a progressive unit bed. --TSH  mildly elevated -converted to sinus rhythm around noon on 11/10. ditiazem gtt stopped, dilt po started -2D echo will be obtained and cardiology consulted -We will start him on p.o. Eliquis as his CHA2DS2-VASc score is 3.  Acute likely diastolic CHF. -The patient will be diuresed with IV Lasix. Not on lasix at home -We will follow serial troponin I's. -2D echo will be obtained and cardiology consult as mentioned above.  Elevated troponin I with associated chest pain. Hx non-ischemic CAD on ~2014 cath. -We will follow serial troponin I's and rule out acute coronary syndrome. -This could certainly be due to myocardial strain from atrial fibrillation and acute CHF. Initial trops 79>70 - add atorva per cardiology rec  Essential hypertension. BP low normal -discontinue home lisin/hctz given low bps and dilt drip and new lasix  BPH. -We will continue Proscar and Flomax.  OSA not on cpap at home - will need outpt sleep study  Glaucoma. -We will continue his ophthalmic gtt.   DVT prophylaxis: apixaban Code Status: full Family Communication: none at bedside  Status is: Inpatient  Remains inpatient appropriate because:Inpatient level of care appropriate due to severity of illness   Dispo: The patient is from: Home              Anticipated d/c is to: Home              Anticipated d/c date is: 2 days              Patient currently is not medically stable to d/c.        Consultants:  cardiology  Procedures: Echo  pending  Antimicrobials:  none    Subjective: Feeling improved. No chest pain or palpitations. No dyspnea.  Objective: Vitals:   09/24/20 0745 09/24/20 0800 09/24/20 0815 09/24/20 0830  BP: 107/66 97/67 101/75 103/73  Pulse:      Resp: (!) 26 18 19 19   Temp:      TempSrc:      SpO2:  99%  98%  Weight:      Height:        Intake/Output Summary (Last 24 hours) at 09/24/2020 0839 Last data filed at 09/24/2020 0701 Gross per 24 hour  Intake --   Output 600 ml  Net -600 ml   Filed Weights   09/24/20 0218  Weight: 113.4 kg    Examination:  General exam: Appears calm and comfortable  Respiratory system: Clear to auscultation. Respiratory effort normal. Cardiovascular system: S1 & S2 heard, irreg irreg. No JVD, murmurs, rubs, gallops or clicks. No pedal edema. Gastrointestinal system: Abdomen is nondistended, soft and nontender. No organomegaly or masses felt. Normal bowel sounds heard. Central nervous system: Alert and oriented. No focal neurological deficits. Extremities: Symmetric 5 x 5 power. Skin: No rashes, lesions or ulcers Psychiatry: Judgement and insight appear normal. Mood & affect appropriate.     Data Reviewed: I have personally reviewed following labs and imaging studies  CBC: Recent Labs  Lab 09/24/20 0224  WBC 6.6  NEUTROABS 3.8  HGB 15.1  HCT 42.9  MCV 90.1  PLT 314   Basic Metabolic Panel: Recent Labs  Lab 09/24/20 0224  NA 140  K 3.6  CL 108  CO2 25  GLUCOSE 154*  BUN 18  CREATININE 0.70  CALCIUM 8.4*   GFR: Estimated Creatinine Clearance: 106.9 mL/min (by C-G formula based on SCr of 0.7 mg/dL). Liver Function Tests: Recent Labs  Lab 09/24/20 0224  AST 39  ALT 43  ALKPHOS 67  BILITOT 0.5  PROT 6.5  ALBUMIN 3.6   No results for input(s): LIPASE, AMYLASE in the last 168 hours. No results for input(s): AMMONIA in the last 168 hours. Coagulation Profile: No results for input(s): INR, PROTIME in the last 168 hours. Cardiac Enzymes: No results for input(s): CKTOTAL, CKMB, CKMBINDEX, TROPONINI in the last 168 hours. BNP (last 3 results) No results for input(s): PROBNP in the last 8760 hours. HbA1C: No results for input(s): HGBA1C in the last 72 hours. CBG: No results for input(s): GLUCAP in the last 168 hours. Lipid Profile: No results for input(s): CHOL, HDL, LDLCALC, TRIG, CHOLHDL, LDLDIRECT in the last 72 hours. Thyroid Function Tests: Recent Labs    09/24/20 0357   TSH 5.284*   Anemia Panel: No results for input(s): VITAMINB12, FOLATE, FERRITIN, TIBC, IRON, RETICCTPCT in the last 72 hours. Urine analysis:    Component Value Date/Time   COLORURINE YELLOW (A) 01/15/2020 1007   APPEARANCEUR CLEAR (A) 01/15/2020 1007   LABSPEC 1.020 01/15/2020 1007   PHURINE 6.0 01/15/2020 1007   GLUCOSEU NEGATIVE 01/15/2020 1007   HGBUR NEGATIVE 01/15/2020 1007   BILIRUBINUR NEGATIVE 01/15/2020 1007   KETONESUR NEGATIVE 01/15/2020 1007   PROTEINUR NEGATIVE 01/15/2020 1007   NITRITE NEGATIVE 01/15/2020 1007   LEUKOCYTESUR NEGATIVE 01/15/2020 1007   Sepsis Labs: @LABRCNTIP (procalcitonin:4,lacticidven:4)  )No results found for this or any previous visit (from the past 240 hour(s)).       Radiology Studies: DG Chest 2 View  Result Date: 09/24/2020 CLINICAL DATA:  Chest pain, shortness of breath and chest pressure EXAM: CHEST - 2 VIEW COMPARISON:  Radiograph  04/17/2020, CT 12/27/2014 FINDINGS: Low volumes and basilar atelectatic changes. Right lower lobe reticular opacities and chronic pleural thickening are similar to comparison radiograph and likely reflect a region of pleuroparenchymal scarring. No new consolidative opacity is evident. While the cardiomediastinal contours are unchanged from comparison radiograph with a calcified tortuous aorta. Prominence of the ascending aorta and arch on the lateral view is somewhat more conspicuous than on more remote comparison CT. Remaining cardiomediastinal contours are unremarkable. No acute osseous or soft tissue abnormality. Degenerative changes are present in the imaged spine and shoulders. IMPRESSION: 1. Low lung volumes with basilar atelectasis. 2. Right basilar pleuroparenchymal scarring. 3. Prominence of the ascending aorta and arch on the lateral view is more conspicuous than on remote comparison CT though similar to more recent comparison radiograph. Could reflect ascending aortic aneurysm or dilation and given  acute symptoms may warrant further evaluation with CT angiography. Electronically Signed   By: Lovena Le M.D.   On: 09/24/2020 02:56   CT ANGIO CHEST AORTA W/CM & OR WO/CM  Result Date: 09/24/2020 CLINICAL DATA:  Chest pain with nonspecific x-ray possibly showing dilated aorta. EXAM: CT ANGIOGRAPHY CHEST WITH CONTRAST TECHNIQUE: Multidetector CT imaging of the chest was performed using the standard protocol during bolus administration of intravenous contrast. Multiplanar CT image reconstructions and MIPs were obtained to evaluate the vascular anatomy. CONTRAST:  189mL OMNIPAQUE IOHEXOL 350 MG/ML SOLN COMPARISON:  Chest x-ray from earlier today and chest CT 12/27/2014 FINDINGS: Cardiovascular: Normal heart size. No pericardial effusion. Aortic tortuosity. No intramural hematoma or dissection of the aorta. No aortic dissection. Atheromatous calcification of the coronary arteries which is multifocal in the left and right circulation. Essentially non-opacified pulmonary arteries. Mediastinum/Nodes: Negative for adenopathy or mass. Lungs/Pleura: There is no edema, consolidation, effusion, or pneumothorax. Motion artifact at the apices. Upper Abdomen: 2 calcified gallstones. No superimposed inflammation. Musculoskeletal: No acute or aggressive finding. Thoracic spondylosis Review of the MIP images confirms the above findings. IMPRESSION: 1. No evidence of acute aortic syndrome. 2. The chest x-ray appearance is related to aortic tortuosity rather than aneurysm. 3. Multifocal coronary atherosclerotic calcification. 4. Cholelithiasis Electronically Signed   By: Monte Fantasia M.D.   On: 09/24/2020 04:32        Scheduled Meds: . apixaban  5 mg Oral BID  . aspirin EC  81 mg Oral Daily  . finasteride  5 mg Oral Daily  . furosemide  40 mg Intravenous Q12H  . lisinopril  10 mg Oral Daily   And  . hydrochlorothiazide  12.5 mg Oral Daily  . tamsulosin  0.4 mg Oral Daily   Continuous Infusions: . diltiazem  (CARDIZEM) infusion 2.5 mg/hr (09/24/20 0602)     LOS: 0 days    Time spent: 18 min    Desma Maxim, MD [facility]   If 7PM-7AM, please contact night-coverage [email or web address] Password [facility] 09/24/2020, 8:39 AM

## 2020-09-24 NOTE — ED Notes (Addendum)
Daughter at bedside. Waiting for pharmacy to send flomax to be administered

## 2020-09-24 NOTE — ED Triage Notes (Signed)
Patient ambulatory to triage with steady gait, without difficulty or distress noted; pt reports SHOB and upper chest "pressure" tonight; ran out of lisinopril few days ago

## 2020-09-24 NOTE — ED Notes (Signed)
This RN messaged MD in regards to pt's BP trending down and holding the lasix until normalized. MD giving verbal order to hold lasix and will assess pt in rounds.

## 2020-09-24 NOTE — H&P (Signed)
[place]   PATIENT NAME: Matthew Jordan    MR#:  [id number]  DATE OF BIRTH:  1951/02/11  DATE OF ADMISSION:  09/24/2020  PRIMARY CARE PHYSICIAN: Patient, No Pcp Per   REQUESTING/REFERRING PHYSICIAN: Conni Slipper, MD CHIEF COMPLAINT:   Chief Complaint  Patient presents with  . Chest Pain    HISTORY OF PRESENT ILLNESS:  Matthew Jordan  is a 69 y.o. male with a known history of coronary artery disease, CHF, GERD, hypertension and sleep apnea, presented to the emergency room with acute onset of intermittent chest pain over the last day felt as palpitations with associated dyspnea, orthopnea paroxysmal nocturnal dyspnea over the last 3 days and generalized fatigue and weakness.  He had nausea and vomiting once while going to radiology department.  No fever or chills.  No dysuria, oliguria or hematuria or flank pain.  Upon presentation to the emergency room, heart rate was 106 with otherwise normal vital signs.  Heart rate has gone up to 115-120 on monitor with atrial fibrillation.  Labs revealed potassium of 3.6 and normal CMP.  BNP was 530.9 and high-sensitivity troponin I was 79 later 70.  CBC was unremarkable.  TSH was 5.28 two-view chest x-ray showed low lung volumes with basilar atelectasis and right basilar pleural-parenchymal scarring and prominent ascending aorta and arch with recommendation for CT angiogram to assess for ascending aortic aneurysm or dilatation.  Chest CTA revealed no evidence for acute aortic syndrome or aneurysm.  Chest x-ray appearance was thought to be related to aortic tortuosity rather than aneurysm.  It showed multifocal coronary atherosclerotic calcification and cholelithiasis.  The patient was given 20 mg of IV Lasix, 10 mg of IV Cardizem and was later started on IV Cardizem drip.  He will be admitted to progressive unit bed for further evaluation and management. PAST MEDICAL HISTORY:   Past Medical History:  Diagnosis Date  . Arthritis   . BPH  with urinary obstruction   . CAD (coronary artery disease) Fall 2014   40% stenosis of LAD on cardiac cath  . CAD (coronary artery disease)   . Cardiac arrhythmia   . Cardiac dysrhythmia   . Chest pain   . CHF (congestive heart failure) ([place])   . Dysphagia   . ED (erectile dysfunction)   . Elevated PSA   . GERD (gastroesophageal reflux disease)   . Hypertension   . Irregular heart beat   . Left ventricular hypertrophy Fall 2014   mild concentric  . Prostate finding   . Sleep apnea   . SOB (shortness of breath)   . SOB (shortness of breath)     PAST SURGICAL HISTORY:   Past Surgical History:  Procedure Laterality Date  . CARDIAC CATHETERIZATION  2014   [place]  . HERNIA REPAIR      SOCIAL HISTORY:   Social History   Tobacco Use  . Smoking status: Former Smoker    Packs/day: 0.25    Years: 10.00    Pack years: 2.50    Types: Cigarettes    Quit date: 10/02/1980    Years since quitting: 40.0  . Smokeless tobacco: Never Used  Substance Use Topics  . Alcohol use: Yes    Comment: weekly    FAMILY HISTORY:   Family History  Problem Relation Age of Onset  . Hypertension Mother   . Lung disease Mother   . Hypertension Father   . Cancer Father        possible prostate  cancer  . Kidney disease Neg Hx     DRUG ALLERGIES:   Allergies  Allergen Reactions  . Amoxicillin     REVIEW OF SYSTEMS:   ROS As per history of present illness. All pertinent systems were reviewed above. Constitutional, HEENT, cardiovascular, respiratory, GI, GU, musculoskeletal, neuro, psychiatric, endocrine, integumentary and hematologic systems were reviewed and are otherwise negative/unremarkable except for positive findings mentioned above in the HPI.   MEDICATIONS AT HOME:   Prior to Admission medications   Medication Sig Start Date End Date Taking? Authorizing Provider  aspirin EC 81 MG tablet Take 81 mg by mouth daily.    [provider]  finasteride (PROSCAR) 5 MG  tablet Take 1 tablet (5 mg total) by mouth daily. 10/07/15   McGowan, Hunt Oris, PA-C  lisinopril-hydrochlorothiazide (PRINZIDE,ZESTORETIC) 10-12.5 MG tablet TAKE 1 TABLET BY MOUTH EVERY DAY 11/12/15   Lada, Satira Anis, MD  sildenafil (VIAGRA) 100 MG tablet Take 1 tablet (100 mg total) by mouth daily as needed for erectile dysfunction. 10/07/15   Zara Council A, PA-C  tamsulosin (FLOMAX) 0.4 MG CAPS capsule Take 0.4 mg by mouth daily.  12/27/14   [provider]  traMADol (ULTRAM) 50 MG tablet Take 50 mg by mouth. Take 1-2 tabs every 6 hours PRN 12/27/14   [provider]      VITAL SIGNS:  Blood pressure 98/75, pulse (!) 113, temperature 98 F (36.7 C), temperature source Oral, resp. rate (!) 22, height 5\' 10"  (1.778 m), weight 113.4 kg, SpO2 95 %.  PHYSICAL EXAMINATION:  Physical Exam  GENERAL:  69 y.o.-year-old Hispanic male patient lying in the bed with mild respiratory distress with conversational dyspnea. EYES: Pupils equal, round, reactive to light and accommodation. No scleral icterus. Extraocular muscles intact.  HEENT: Head atraumatic, normocephalic. Oropharynx and nasopharynx clear.  NECK:  Supple, no jugular venous distention. No thyroid enlargement, no tenderness.  LUNGS: Diminished bibasal breath sounds.  No significant rales wheezes or rhonchi.  CARDIOVASCULAR: Irregularly irregular tachycardic rhythm, S1, S2 normal. No murmurs, rubs, or gallops.  ABDOMEN: Soft, nondistended, nontender. Bowel sounds present. No organomegaly or mass.  EXTREMITIES: 1+ bilateral lower extremity pitting edema with no cyanosis, or clubbing.  NEUROLOGIC: Cranial nerves II through XII are intact. Muscle strength 5/5 in all extremities. Sensation intact. Gait not checked.  PSYCHIATRIC: The patient is alert and oriented x 3.  Normal affect and good eye contact. SKIN: No obvious rash, lesion, or ulcer.   LABORATORY PANEL:   CBC Recent Labs  Lab 09/24/20 0224  WBC 6.6  HGB  15.1  HCT 42.9  PLT 179   ------------------------------------------------------------------------------------------------------------------  Chemistries  Recent Labs  Lab 09/24/20 0224  NA 140  K 3.6  CL 108  CO2 25  GLUCOSE 154*  BUN 18  CREATININE 0.70  CALCIUM 8.4*  AST 39  ALT 43  ALKPHOS 67  BILITOT 0.5   ------------------------------------------------------------------------------------------------------------------  Cardiac Enzymes No results for input(s): TROPONINI in the last 168 hours. ------------------------------------------------------------------------------------------------------------------  RADIOLOGY:  DG Chest 2 View  Result Date: 09/24/2020 CLINICAL DATA:  Chest pain, shortness of breath and chest pressure EXAM: CHEST - 2 VIEW COMPARISON:  Radiograph 04/17/2020, CT 12/27/2014 FINDINGS: Low volumes and basilar atelectatic changes. Right lower lobe reticular opacities and chronic pleural thickening are similar to comparison radiograph and likely reflect a region of pleuroparenchymal scarring. No new consolidative opacity is evident. While the cardiomediastinal contours are unchanged from comparison radiograph with a calcified tortuous aorta. Prominence of the ascending aorta  and arch on the lateral view is somewhat more conspicuous than on more remote comparison CT. Remaining cardiomediastinal contours are unremarkable. No acute osseous or soft tissue abnormality. Degenerative changes are present in the imaged spine and shoulders. IMPRESSION: 1. Low lung volumes with basilar atelectasis. 2. Right basilar pleuroparenchymal scarring. 3. Prominence of the ascending aorta and arch on the lateral view is more conspicuous than on remote comparison CT though similar to more recent comparison radiograph. Could reflect ascending aortic aneurysm or dilation and given acute symptoms may warrant further evaluation with CT angiography. Electronically Signed   By: Lovena Le  M.D.   On: 09/24/2020 02:56   CT ANGIO CHEST AORTA W/CM & OR WO/CM  Result Date: 09/24/2020 CLINICAL DATA:  Chest pain with nonspecific x-ray possibly showing dilated aorta. EXAM: CT ANGIOGRAPHY CHEST WITH CONTRAST TECHNIQUE: Multidetector CT imaging of the chest was performed using the standard protocol during bolus administration of intravenous contrast. Multiplanar CT image reconstructions and MIPs were obtained to evaluate the vascular anatomy. CONTRAST:  149mL OMNIPAQUE IOHEXOL 350 MG/ML SOLN COMPARISON:  Chest x-ray from earlier today and chest CT 12/27/2014 FINDINGS: Cardiovascular: Normal heart size. No pericardial effusion. Aortic tortuosity. No intramural hematoma or dissection of the aorta. No aortic dissection. Atheromatous calcification of the coronary arteries which is multifocal in the left and right circulation. Essentially non-opacified pulmonary arteries. Mediastinum/Nodes: Negative for adenopathy or mass. Lungs/Pleura: There is no edema, consolidation, effusion, or pneumothorax. Motion artifact at the apices. Upper Abdomen: 2 calcified gallstones. No superimposed inflammation. Musculoskeletal: No acute or aggressive finding. Thoracic spondylosis Review of the MIP images confirms the above findings. IMPRESSION: 1. No evidence of acute aortic syndrome. 2. The chest x-ray appearance is related to aortic tortuosity rather than aneurysm. 3. Multifocal coronary atherosclerotic calcification. 4. Cholelithiasis Electronically Signed   By: Monte Fantasia M.D.   On: 09/24/2020 04:32      IMPRESSION AND PLAN:   1.  Paroxysmal atrial fibrillation with rapid ventricular response and associated chest pain. -The patient will be admitted to a progressive unit bed. -We will continue him on IV Cardizem drip. -2D echo will be obtained and cardiology consult. -I notified Dr. Ubaldo Glassing about the patient. -We will optimize potassium and magnesium levels. -We will start him on p.o. Eliquis as his  CHA2DS2-VASc score is 3.  2.  Acute likely diastolic CHF. -The patient will be diuresed with IV Lasix. -We will follow serial troponin I's. -2D echo will be obtained and cardiology consult as mentioned above.  3.  Elevated troponin I with associated chest pain. -We will follow serial troponin I's and rule out acute coronary syndrome. -This could certainly be due to myocardial strain from atrial fibrillation and acute CHF.  4.  Essential hypertension. -We will continue lisinopril HCT.  5.  Dyslipidemia. -We will continue statin therapy.  6.  BPH. -We will continue Proscar and Flomax.  7.  Glaucoma. -We will continue his ophthalmic gtt.  8.  DVT prophylaxis. -The patient will be on p.o. Eliquis.  All the records are reviewed and case discussed with ED provider. The plan of care was discussed in details with the patient (and family). I answered all questions. The patient agreed to proceed with the above mentioned plan. Further management will depend upon hospital course.   CODE STATUS: Full code  Status is: Inpatient  Remains inpatient appropriate because:Ongoing diagnostic testing needed not appropriate for outpatient work up, Unsafe d/c plan, IV treatments appropriate due to intensity of illness or inability  to take PO and Inpatient level of care appropriate due to severity of illness   Dispo: The patient is from: Home              Anticipated d/c is to: Home              Anticipated d/c date is: 2 days              Patient currently is not medically stable to d/c.   TOTAL TIME TAKING CARE OF THIS PATIENT: 60 minutes.    Christel Mormon M.D on 09/24/2020 at 4:56 AM  [facility]   From 7 PM-7 AM, contact night-coverage [email or web address]  CC: Primary care physician; Patient, No Pcp Per

## 2020-09-24 NOTE — TOC Initial Note (Addendum)
Transition of Care [facility]) - Initial/Assessment Note    Patient Details  Name: Matthew Jordan MRN: [id number] Date of Birth: 21-Jun-1951  Transition of Care [facility] CM/SW Contact:    Ova Freshwater Phone Number: [phone] 09/24/2020, 1:10 PM  Clinical Narrative:                  Patient needs Spanish interpreter, daughter speaks [place].  CSW spoke with patient's daughter Plez, Belton (Daughter) [phone] (she speaks English). CSW explained role of TOC in patient care.  Ms. Sloane stated the patient moved to [place] from [place], less than a month ago and does not have a local PCP.  Ms. Closs stated the patient did not have a PCP in [place] and the prescription he had was from a visit to Urgent Care in [place].  Ms. Maffei stated the patient was given an appointment to establish care with a PCP in [place], but the appointment was weeks from now.  Ms. Vasudevan stated patient is able to complete all ADLS without assistance but has had issued of incontinence and she is not certain whether he is bathing every day because he is loosing his eye sight due to his glaucoma. Patient refused to give information about his ability to bathe or bathroom habits.  Ms. Shafer also stated the patient drives even with his impaired eyed sight.  Ms. Degraffenreid stated the patient drove himself to [facility] ED in the middle of the night without letting her know.   CSW asked whether patient would consider a SNF placement or home health and she stated she believes he would prefer home health. CSW gave Ms. Romeo Rabon Medicare.gov website for SNF anf [facility] information.  CSW also stated she would give Ms. Smitherman a resources list to contact clinics to establish PCP care for the patient.  Ms. Matton verbalized understanding.   Expected Discharge Plan: Skilled Nursing Facility Barriers to Discharge: Continued Medical Work up, ED Awaiting State Approval Rosalie Gums)   Patient Goals and CMS Choice   CMS Medicare.gov Compare Post Acute Care list  provided to:: Other (Comment Required) Zayan, Delvecchio (Daughter) [phone]) Choice offered to / list presented to : Adult Children Dmario, Russom (Daughter) [phone])  Expected Discharge Plan and Services Expected Discharge Plan: [place] In-house Referral: Clinical Social Work                                            Prior Living Arrangements/Services   Lives with:: Adult Children Torian, Quintero (Daughter) [phone]) Patient language and need for interpreter reviewed:: Yes (Patient needs Spanish interpreter)        Need for Family Participation in Patient Care: Yes (Comment) Care giver support system in place?: Yes (comment)   Criminal Activity/Legal Involvement Pertinent to Current Situation/Hospitalization: No - Comment as needed  Activities of Daily Living      Permission Sought/Granted                  Emotional Assessment Appearance:: Appears stated age Attitude/Demeanor/Rapport: Self-Confident Affect (typically observed): Appropriate Orientation: : Oriented to Self, Oriented to Place, Oriented to Situation, Oriented to  Time Alcohol / Substance Use: Not Applicable Psych Involvement: No (comment)  Admission diagnosis:  Acute CHF (congestive heart failure) ([place]) [I50.9] Patient Active Problem List   Diagnosis Date Noted  . Acute CHF (congestive heart failure) ([place]) 09/24/2020  . 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 Glaucoma  of both eyes 04/06/2019  . History of inguinal hernia repair, bilateral 04/06/2019  . BPH with obstruction/lower urinary tract symptoms 10/07/2015  . Varicose veins 10/03/2015  . CAD (coronary artery disease)   . Arthritis   . GERD (gastroesophageal reflux disease)   . Elevated PSA   . Left ventricular hypertrophy   . Cardiac dysrhythmia   . Dysphagia   . ED (erectile dysfunction)   . Essential hypertension 01/02/2015   PCP:  Patient, No Pcp Per Pharmacy:   [id number] [address], [place] - [address] [place] [address] Phone: [phone] Fax: [phone]  [id number] - [address], [place] - [address] [place] [address] Phone: [phone] Fax: [phone]  [id number] - [place], [address] [place] [address] Phone: [phone] Fax: [phone]     Social Determinants of Health (SDOH) Interventions    Readmission Risk Interventions No flowsheet data found.

## 2020-09-24 NOTE — Consult Note (Signed)
CARDIOLOGY CONSULT NOTE               Patient ID: BOUBACAR LERETTE MRN: [id number] DOB/AGE: Nov 14, 1951 69 y.o.  Admit date: 09/24/2020 Referring Physician Dr. Eugenie Norrie  Primary Physician N/A Primary Cardiologist Dr. Josefa Half  Reason for Consultation Atrial fibrillation with RVR, CHF, chest pain   HPI: Mr. Kaczmarek is a 69 year old male with a past medical history significant for non-obstructive CAD, diagnosed per [facility] in 2014 in [place], [place] and treated medically, hyperlipidemia, hypertension, and OSA, not on CPAP, who presented to the ED on 09/24/20 for chest tightness with associated weakness, instability, and shortness of breath.  Workup in the ED has been significant for high sensitivity troponin elevated x 2, 79 and 70 respectively, BNP of 530, chest CT revealing aortic tortuosity with no evidence of an aneurysm and multifocal coronary atherosclerotic calcifications, and ECG revealing new onset atrial fibrillation with RVR.   Mr. Santoli is followed in outpatient cardiology by Dr. Saralyn Pilar. Stress echocardiogram on 04/27/19 revealed normal RV and LV systolic function with a resting and stress EF estimated greater than 55% with moderate LVH; no significant valvular disease noted. No evidence of exercise induced ischemia.   09/24/20: On exam, Mr. Aguinaga is lying in bed, comfortably, in no acute distress.  HR is fluctuating between upper 80s and low 100s.  Chest pain and shortness of breath have significantly improved since arrival to the ED.  He denies lower extremity swelling, orthopnea, or PND.  He has a history of OSA, but no longer has a CPAP machine.   Review of systems complete and found to be negative unless listed above     Past Medical History:  Diagnosis Date   Arthritis    BPH with urinary obstruction    CAD (coronary artery disease) Fall 2014   40% stenosis of LAD on cardiac cath   CAD (coronary artery disease)    Cardiac arrhythmia    Cardiac dysrhythmia     Chest pain    CHF (congestive heart failure) (HCC)    Dysphagia    ED (erectile dysfunction)    Elevated PSA    GERD (gastroesophageal reflux disease)    Hypertension    Irregular heart beat    Left ventricular hypertrophy Fall 2014   mild concentric   Prostate finding    Sleep apnea    SOB (shortness of breath)    SOB (shortness of breath)     Past Surgical History:  Procedure Laterality Date   CARDIAC CATHETERIZATION  2014   [place]      (Not in a hospital admission)  Social History   Socioeconomic History   Marital status: Single    Spouse name: Not on file   Number of children: Not on file   Years of education: Not on file   Highest education level: Not on file  Occupational History   Not on file  Tobacco Use   Smoking status: Former Smoker    Packs/day: 0.25    Years: 10.00    Pack years: 2.50    Types: Cigarettes    Quit date: 10/02/1980    Years since quitting: 40.0   Smokeless tobacco: Never Used  Vaping Use   Vaping Use: Never used  Substance and Sexual Activity   Alcohol use: Yes    Comment: weekly   Drug use: No   Sexual activity: Not on file  Other Topics Concern   Not on file  Social History Narrative   Not on file   Social Determinants of Health   Financial Resource Strain:    Difficulty of Paying Living Expenses: Not on file  Food Insecurity:    Worried About Charity fundraiser in the Last Year: Not on file   [employer] of Food in the Last Year: Not on file  Transportation Needs:    Lack of Transportation (Medical): Not on file   Lack of Transportation (Non-Medical): Not on file  Physical Activity:    Days of Exercise per Week: Not on file   Minutes of Exercise per Session: Not on file  Stress:    Feeling of Stress : Not on file  Social Connections:    Frequency of Communication with Friends and Family: Not on file   Frequency of Social Gatherings with Friends and Family: Not on  file   Attends Religious Services: Not on file   Active Member of Clubs or Organizations: Not on file   Attends Archivist Meetings: Not on file   Marital Status: Not on file  Intimate Partner Violence:    Fear of Current or Ex-Partner: Not on file   Emotionally Abused: Not on file   Physically Abused: Not on file   Sexually Abused: Not on file    Family History  Problem Relation Age of Onset   Hypertension Mother    Lung disease Mother    Hypertension Father    Cancer Father        possible prostate cancer   Kidney disease Neg Hx       Review of systems complete and found to be negative unless listed above      PHYSICAL EXAM  General: Well developed, well nourished, in no acute distress HEENT:  Normocephalic and atramatic Neck:  No JVD.  Lungs: Clear bilaterally to auscultation and percussion. Heart: Irregularly irregular, varying ventricular response . Normal S1 and S2 without gallops or murmurs.  Abdomen: Bowel sounds are positive, abdomen soft and non-tender  Msk:  Back normal.  Normal strength and tone for age. Extremities: No clubbing, cyanosis or edema.   Neuro: Alert and oriented X 3. Psych:  Good affect, responds appropriately  Labs:   Lab Results  Component Value Date   WBC 6.6 09/24/2020   HGB 15.1 09/24/2020   HCT 42.9 09/24/2020   MCV 90.1 09/24/2020   PLT 179 09/24/2020    Recent Labs  Lab 09/24/20 0224  NA 140  K 3.6  CL 108  CO2 25  BUN 18  CREATININE 0.70  CALCIUM 8.4*  PROT 6.5  BILITOT 0.5  ALKPHOS 67  ALT 43  AST 39  GLUCOSE 154*   Lab Results  Component Value Date   TROPONINI <0.03 05/30/2015    Lab Results  Component Value Date   CHOL 166 10/03/2015   Lab Results  Component Value Date   HDL 32 (L) 10/03/2015   Lab Results  Component Value Date   LDLCALC 96 10/03/2015   Lab Results  Component Value Date   TRIG 192 (H) 10/03/2015   No results found for: CHOLHDL No results found for:  LDLDIRECT    Radiology: DG Chest 2 View  Result Date: 09/24/2020 CLINICAL DATA:  Chest pain, shortness of breath and chest pressure EXAM: CHEST - 2 VIEW COMPARISON:  Radiograph 04/17/2020, CT 12/27/2014 FINDINGS: Low volumes and basilar atelectatic changes. Right lower lobe reticular opacities and chronic pleural thickening are similar to comparison radiograph and likely reflect  a region of pleuroparenchymal scarring. No new consolidative opacity is evident. While the cardiomediastinal contours are unchanged from comparison radiograph with a calcified tortuous aorta. Prominence of the ascending aorta and arch on the lateral view is somewhat more conspicuous than on more remote comparison CT. Remaining cardiomediastinal contours are unremarkable. No acute osseous or soft tissue abnormality. Degenerative changes are present in the imaged spine and shoulders. IMPRESSION: 1. Low lung volumes with basilar atelectasis. 2. Right basilar pleuroparenchymal scarring. 3. Prominence of the ascending aorta and arch on the lateral view is more conspicuous than on remote comparison CT though similar to more recent comparison radiograph. Could reflect ascending aortic aneurysm or dilation and given acute symptoms may warrant further evaluation with CT angiography. Electronically Signed   By: Lovena Le M.D.   On: 09/24/2020 02:56   CT ANGIO CHEST AORTA W/CM & OR WO/CM  Result Date: 09/24/2020 CLINICAL DATA:  Chest pain with nonspecific x-ray possibly showing dilated aorta. EXAM: CT ANGIOGRAPHY CHEST WITH CONTRAST TECHNIQUE: Multidetector CT imaging of the chest was performed using the standard protocol during bolus administration of intravenous contrast. Multiplanar CT image reconstructions and MIPs were obtained to evaluate the vascular anatomy. CONTRAST:  173mL OMNIPAQUE IOHEXOL 350 MG/ML SOLN COMPARISON:  Chest x-ray from earlier today and chest CT 12/27/2014 FINDINGS: Cardiovascular: Normal heart size. No  pericardial effusion. Aortic tortuosity. No intramural hematoma or dissection of the aorta. No aortic dissection. Atheromatous calcification of the coronary arteries which is multifocal in the left and right circulation. Essentially non-opacified pulmonary arteries. Mediastinum/Nodes: Negative for adenopathy or mass. Lungs/Pleura: There is no edema, consolidation, effusion, or pneumothorax. Motion artifact at the apices. Upper Abdomen: 2 calcified gallstones. No superimposed inflammation. Musculoskeletal: No acute or aggressive finding. Thoracic spondylosis Review of the MIP images confirms the above findings. IMPRESSION: 1. No evidence of acute aortic syndrome. 2. The chest x-ray appearance is related to aortic tortuosity rather than aneurysm. 3. Multifocal coronary atherosclerotic calcification. 4. Cholelithiasis Electronically Signed   By: Monte Fantasia M.D.   On: 09/24/2020 04:32    EKG: New onset atrial fibrillation with RVR, rate of 119bpm   ASSESSMENT AND PLAN:  1.  New onset atrial fibrillation   -Currently on diltiazem drip; will continue with this and transition to oral when appropriate   -With a CHA2D2s VASc score of 3, agree with plan to add Eliquis 5mg  BID   -Echocardiogram ordered   -Discussed the importance of CPAP compliance; will need to consider repeat sleep study in an outpatient setting   2.  History of CAD  -LHC in 2014 revealed mild, non-obstructive CAD, treated medically   -Continue aspirin 81mg  daily; would encourage addition of statin if tolerated   -Troponin borderline elevated, likely demand ischemia in the setting of new onset atrial fibrillation   3.  Elevated troponin   -79, 70; ruled out for ACS; likely demand ischemia   4.  Obstructive sleep apnea   -No longer compliant with CPAP; will need repeat sleep study in an outpatient setting   5.  Hypertension   -BP relatively soft on diltiazem drip; continue to monitor and will hold HCTZ-lisinopril if warranted    The history, physical exam findings, and plan of care were all discussed with Dr. Bartholome Bill, and all decision making was made in collaboration.   Signed: Avie Arenas PA-C 09/24/2020, 8:13 AM

## 2020-09-24 NOTE — ED Notes (Signed)
Cardiology at bedside.

## 2020-09-24 NOTE — Discharge Instructions (Signed)

## 2020-09-24 NOTE — ED Notes (Signed)
Reviewed pt's CXR results with Dr Karma Greaser, acuity level changed

## 2020-09-24 NOTE — ED Notes (Signed)
Pt converted to NSR. Repeat EKG done. Admitting MD made aware.

## 2020-09-24 NOTE — ED Notes (Signed)
Admitting MD at bedside.

## 2020-09-25 ENCOUNTER — Inpatient Hospital Stay
Admit: 2020-09-25 | Discharge: 2020-09-25 | Disposition: A | Discharge status: Home / Self Care | Payer: Medicare Other | Attending: Family Medicine | Admitting: Family Medicine

## 2020-09-25 DIAGNOSIS — I4891 Unspecified atrial fibrillation: Secondary | ICD-10-CM

## 2020-09-25 LAB — BASIC METABOLIC PANEL
Anion gap: 6 (ref 5–15)
BUN: 13 mg/dL (ref 8–23)
CO2: 27 mmol/L (ref 22–32)
Calcium: 8.9 mg/dL (ref 8.9–10.3)
Chloride: 107 mmol/L (ref 98–111)
Creatinine, Ser: 0.86 mg/dL (ref 0.61–1.24)
GFR, Estimated: >60 mL/min (ref >60)
Glucose, Bld: 121 mg/dL — ABNORMAL HIGH (ref 70–99)
Potassium: 3.5 mmol/L (ref 3.5–5.1)
Sodium: 140 mmol/L (ref 135–145)

## 2020-09-25 LAB — CBC
HCT: 45 % (ref 39.0–52.0)
Hemoglobin: 15.6 g/dL (ref 13.0–17.0)
MCH: 31.3 pg (ref 26.0–34.0)
MCHC: 34.7 g/dL (ref 30.0–36.0)
MCV: 90.2 fL (ref 80.0–100.0)
Platelets: 179 10*3/uL (ref 150–400)
RBC: 4.99 MIL/uL (ref 4.22–5.81)
RDW: 12.2 % (ref 11.5–15.5)
WBC: 6.5 10*3/uL (ref 4.0–10.5)
nRBC: 0 % (ref 0.0–0.2)

## 2020-09-25 LAB — T4, FREE: Free T4: 0.88 ng/dL (ref 0.61–1.12)

## 2020-09-25 MED ORDER — ATORVASTATIN CALCIUM 40 MG PO TABS: 40.0000 mg | ORAL_TABLET | Freq: Every day | ORAL | 0 refills | Status: DC

## 2020-09-25 MED ORDER — LISINOPRIL 10 MG PO TABS: 10.0000 mg | ORAL_TABLET | Freq: Every day | ORAL | 1 refills | Status: DC

## 2020-09-25 MED ORDER — FUROSEMIDE 40 MG PO TABS: 40.0000 mg | ORAL_TABLET | Freq: Every day | ORAL | Status: DC

## 2020-09-25 MED ORDER — FUROSEMIDE 40 MG PO TABS: 40.0000 mg | ORAL_TABLET | Freq: Every day | ORAL | 0 refills | Status: DC

## 2020-09-25 MED ORDER — DILTIAZEM HCL ER COATED BEADS 240 MG PO CP24: 240.0000 mg | ORAL_CAPSULE | Freq: Every day | ORAL | 0 refills | Status: DC

## 2020-09-25 MED ORDER — APIXABAN 5 MG PO TABS: 5.0000 mg | ORAL_TABLET | Freq: Two times a day (BID) | ORAL | 0 refills | Status: DC

## 2020-09-25 MED ORDER — APIXABAN 5 MG PO TABS
5.0000 mg | ORAL_TABLET | Freq: Two times a day (BID) | ORAL | 1 refills | Status: DC
Start: 2020-09-25 — End: 2020-10-16

## 2020-09-25 NOTE — Progress Notes (Signed)
*  PRELIMINARY RESULTS* Echocardiogram 2D Echocardiogram has been performed.  Sherrie Sport 09/25/2020, 10:16 AM

## 2020-09-25 NOTE — Discharge Summary (Addendum)
OTIS PORTAL CXK:[id number] DOB: 1950-12-28 DOA: 09/24/2020  PCP: Patient, No Pcp Per  Admit date: 09/24/2020 Discharge date: 09/25/2020  Time spent: 35 minutes  Recommendations for Outpatient Follow-up:  1. Needs to establish with a PCP 2. Cardiology f/u Paraschos 1-2 weeks 3. Will need assessment of kidney function and electrolytes within 1-2 weeks 4. Will need f/u of T3/T4 (ordered for mildly elevated TSH, result pending @ time of d/c)    Discharge Diagnoses:  Active Problems:   Acute CHF (congestive heart failure) ([place])   Discharge Condition: good  Diet recommendation: low sodium heart healthy  Filed Weights   09/24/20 0218 09/24/20 1622 09/25/20 0600  Weight: 113.4 kg 115.4 kg 115 kg    History of present illness:  PascualVazquezis a69 y.o.malewith a known history of coronary artery disease, CHF, GERD, hypertension and sleep apnea, presented to the emergency room with acute onset of intermittent chest pain over the last day felt as palpitations with associated dyspnea, orthopnea paroxysmal nocturnal dyspnea over the last 3 days and generalized fatigue and weakness. He had nausea and vomiting once while going to radiology department. No fever or chills. No dysuria, oliguria or hematuria or flank pain.  Upon presentation to the emergency room, heart rate was 106 with otherwise normal vital signs. Heart rate has gone up to 115-120 on monitor with atrial fibrillation. Labs revealed potassium of 3.6 and normal CMP. BNP was 530.9 and high-sensitivity troponin I was 79 later 70. CBC was unremarkable. TSH was 5.28 two-view chest x-ray showed low lung volumes with basilar atelectasis and right basilar pleural-parenchymal scarring and prominent ascending aorta and arch with recommendation for CT angiogram to assess for ascending aortic aneurysm or dilatation. Chest CTA revealed no evidence for acute aortic syndrome or aneurysm. Chest x-ray appearance was thought to be  related to aortic tortuosity rather than aneurysm. It showed multifocal coronary atherosclerotic calcification and cholelithiasis.  The patient was given 20 mg of IV Lasix, 10 mg of IV Cardizem and was later started on IV Cardizem drip. He will be admitted to progressive unit bed for further evaluation and management.  Hospital Course:  Paroxysmal atrial fibrillation with rapid ventricular response and associated chest pain. -converted to sinus rhythm around noon on 11/10. ditiazem gtt stopped, dilt po started. Tolerated that well including with ambulation day of discharge. -echo obtained and result is pending -We will start him on p.o. Eliquis as his CHA2DS2-VASc score is 3.  Acute likely diastolic CHF. -The patient will be diuresed with IV Lasix. Not on lasix at home -2D echo will be obtained and cardiology consult as mentioned above. - home on new lasix 40 mg po qd  Elevated TSH - t3/t4 pending  Elevated troponin I with associated chest pain. Hx non-ischemic CAD on ~2014 cath. -We will follow serial troponin I's and rule out acute coronary syndrome. -This could certainly be due to myocardial strain from atrial fibrillation and acute CHF. Initial trops 79>70 - added atorva per cardiology rec  Essential hypertension. BP low normal -stopped home lisinopril/hctz. Started lasix and dilt as above. Will resume lisinopril at d/c given bp in 150s, will hold in diuretic pending outpt f/u  BPH. - continued Proscar and Flomax.  OSA not on cpap at home - will need outpt sleep study   Procedures:  TTE   Consultations:  cardiology  Discharge Exam: Vitals:   09/25/20 0200 09/25/20 0815  BP:  (!) 150/98  Pulse:  65  Resp: 17 17  Temp:  97.6 F (  36.4 C)  SpO2:  97%    General exam: Appears calm and comfortable  Respiratory system: Clear to auscultation. Respiratory effort normal. Cardiovascular system: S1 & S2 heard, irreg irreg. No JVD, murmurs, rubs, gallops or  clicks. No pedal edema. Gastrointestinal system: Abdomen is nondistended, soft and nontender. No organomegaly or masses felt. Normal bowel sounds heard. Central nervous system: Alert and oriented. No focal neurological deficits. Extremities: Symmetric 5 x 5 power. Skin: No rashes, lesions or ulcers Psychiatry: Judgement and insight appear normal. Mood & affect appropriate.   Discharge Instructions   Discharge Instructions    (HEART FAILURE PATIENTS) Call MD:  Anytime you have any of the following symptoms: 1) 3 pound weight gain in 24 hours or 5 pounds in 1 week 2) shortness of breath, with or without a dry hacking cough 3) swelling in the hands, feet or stomach 4) if you have to sleep on extra pillows at night in order to breathe.   Complete by: As directed    Amb referral to [facility] Clinic   Complete by: As directed    Ambulatory referral to Cardiology   Complete by: As directed    Call MD for:  difficulty breathing, headache or visual disturbances   Complete by: As directed    Call MD for:  extreme fatigue   Complete by: As directed    Call MD for:  persistant dizziness or light-headedness   Complete by: As directed    Call MD for:  persistant nausea and vomiting   Complete by: As directed    Call MD for:  redness, tenderness, or signs of infection (pain, swelling, redness, odor or green/yellow discharge around incision site)   Complete by: As directed    Call MD for:  severe uncontrolled pain   Complete by: As directed    Call MD for:  temperature >100.4   Complete by: As directed    Diet - low sodium heart healthy   Complete by: As directed    Increase activity slowly   Complete by: As directed      Allergies as of 09/25/2020      Reactions   Amoxicillin       Medication List    STOP taking these medications   lisinopril-hydrochlorothiazide 10-12.5 MG tablet Commonly known as: ZESTORETIC   traMADol 50 MG tablet Commonly known as: ULTRAM     TAKE these medications    apixaban 5 MG Tabs tablet Commonly known as: ELIQUIS Take 1 tablet (5 mg total) by mouth 2 (two) times daily.   aspirin EC 81 MG tablet Take 81 mg by mouth daily.   atorvastatin 40 MG tablet Commonly known as: LIPITOR Take 1 tablet (40 mg total) by mouth daily. Start taking on: September 26, 2020   diltiazem 240 MG 24 hr capsule Commonly known as: CARDIZEM CD Take 1 capsule (240 mg total) by mouth daily. Start taking on: September 26, 2020   finasteride 5 MG tablet Commonly known as: PROSCAR Take 1 tablet (5 mg total) by mouth daily.   furosemide 40 MG tablet Commonly known as: LASIX Take 1 tablet (40 mg total) by mouth daily.   lisinopril 10 MG tablet Commonly known as: ZESTRIL Take 1 tablet (10 mg total) by mouth daily.   sildenafil 100 MG tablet Commonly known as: VIAGRA Take 1 tablet (100 mg total) by mouth daily as needed for erectile dysfunction.   tamsulosin 0.4 MG Caps capsule Commonly known as: FLOMAX Take 0.4 mg by mouth  daily.      Allergies  Allergen Reactions  . Amoxicillin     Follow-up Information    Paraschos, Alexander, MD Follow up in 1 week(s).   Specialty: Cardiology Why: Follow up with Dr. Saralyn Pilar or Clabe Seal PA-C within 7-10 days of discharge Contact information: [facility] [place] [address] [phone]                The results of significant diagnostics from this hospitalization (including imaging, microbiology, ancillary and laboratory) are listed below for reference.    Significant Diagnostic Studies: DG Chest 2 View  Result Date: 09/24/2020 CLINICAL DATA:  Chest pain, shortness of breath and chest pressure EXAM: CHEST - 2 VIEW COMPARISON:  Radiograph 04/17/2020, CT 12/27/2014 FINDINGS: Low volumes and basilar atelectatic changes. Right lower lobe reticular opacities and chronic pleural thickening are similar to comparison radiograph and likely reflect a region of  pleuroparenchymal scarring. No new consolidative opacity is evident. While the cardiomediastinal contours are unchanged from comparison radiograph with a calcified tortuous aorta. Prominence of the ascending aorta and arch on the lateral view is somewhat more conspicuous than on more remote comparison CT. Remaining cardiomediastinal contours are unremarkable. No acute osseous or soft tissue abnormality. Degenerative changes are present in the imaged spine and shoulders. IMPRESSION: 1. Low lung volumes with basilar atelectasis. 2. Right basilar pleuroparenchymal scarring. 3. Prominence of the ascending aorta and arch on the lateral view is more conspicuous than on remote comparison CT though similar to more recent comparison radiograph. Could reflect ascending aortic aneurysm or dilation and given acute symptoms may warrant further evaluation with CT angiography. Electronically Signed   By: Lovena Le M.D.   On: 09/24/2020 02:56   CT ANGIO CHEST AORTA W/CM & OR WO/CM  Result Date: 09/24/2020 CLINICAL DATA:  Chest pain with nonspecific x-ray possibly showing dilated aorta. EXAM: CT ANGIOGRAPHY CHEST WITH CONTRAST TECHNIQUE: Multidetector CT imaging of the chest was performed using the standard protocol during bolus administration of intravenous contrast. Multiplanar CT image reconstructions and MIPs were obtained to evaluate the vascular anatomy. CONTRAST:  123mL OMNIPAQUE IOHEXOL 350 MG/ML SOLN COMPARISON:  Chest x-ray from earlier today and chest CT 12/27/2014 FINDINGS: Cardiovascular: Normal heart size. No pericardial effusion. Aortic tortuosity. No intramural hematoma or dissection of the aorta. No aortic dissection. Atheromatous calcification of the coronary arteries which is multifocal in the left and right circulation. Essentially non-opacified pulmonary arteries. Mediastinum/Nodes: Negative for adenopathy or mass. Lungs/Pleura: There is no edema, consolidation, effusion, or pneumothorax. Motion artifact  at the apices. Upper Abdomen: 2 calcified gallstones. No superimposed inflammation. Musculoskeletal: No acute or aggressive finding. Thoracic spondylosis Review of the MIP images confirms the above findings. IMPRESSION: 1. No evidence of acute aortic syndrome. 2. The chest x-ray appearance is related to aortic tortuosity rather than aneurysm. 3. Multifocal coronary atherosclerotic calcification. 4. Cholelithiasis Electronically Signed   By: Monte Fantasia M.D.   On: 09/24/2020 04:32    Microbiology: Recent Results (from the past 240 hour(s))  Respiratory Panel by RT PCR (Flu A&B, Covid) - Nasopharyngeal Swab     Status: None   Collection Time: 09/24/20 12:13 PM   Specimen: Nasopharyngeal Swab  Result Value Ref Range Status   SARS Coronavirus 2 by RT PCR NEGATIVE NEGATIVE Final    Comment: (NOTE) SARS-CoV-2 target nucleic acids are NOT DETECTED.  The SARS-CoV-2 RNA is generally detectable in upper respiratoy specimens during the acute phase of infection. The lowest concentration of SARS-CoV-2  viral copies this assay can detect is 131 copies/mL. A negative result does not preclude SARS-Cov-2 infection and should not be used as the sole basis for treatment or other patient management decisions. A negative result may occur with  improper specimen collection/handling, submission of specimen other than nasopharyngeal swab, presence of viral mutation(s) within the areas targeted by this assay, and inadequate number of viral copies (<131 copies/mL). A negative result must be combined with clinical observations, patient history, and epidemiological information. The expected result is Negative.  Fact Sheet for Patients:  PinkCheek.be  Fact Sheet for Healthcare Providers:  GravelBags.it  This test is no t yet approved or cleared by the [place] FDA and  has been authorized for detection and/or diagnosis of SARS-CoV-2 by FDA under  an Emergency Use Authorization (EUA). This EUA will remain  in effect (meaning this test can be used) for the duration of the COVID-19 declaration under Section 564(b)(1) of the Act, 21 U.S.C. section 360bbb-3(b)(1), unless the authorization is terminated or revoked sooner.     Influenza A by PCR NEGATIVE NEGATIVE Final   Influenza B by PCR NEGATIVE NEGATIVE Final    Comment: (NOTE) The Xpert Xpress SARS-CoV-2/FLU/RSV assay is intended as an aid in  the diagnosis of influenza from Nasopharyngeal swab specimens and  should not be used as a sole basis for treatment. Nasal washings and  aspirates are unacceptable for Xpert Xpress SARS-CoV-2/FLU/RSV  testing.  Fact Sheet for Patients: PinkCheek.be  Fact Sheet for Healthcare Providers: GravelBags.it  This test is not yet approved or cleared by the [place] FDA and  has been authorized for detection and/or diagnosis of SARS-CoV-2 by  FDA under an Emergency Use Authorization (EUA). This EUA will remain  in effect (meaning this test can be used) for the duration of the  Covid-19 declaration under Section 564(b)(1) of the Act, 21  U.S.C. section 360bbb-3(b)(1), unless the authorization is  terminated or revoked. Performed at [facility], [address] [place], [place] [address]      Labs: Basic Metabolic Panel: Recent Labs  Lab 09/24/20 0224 09/24/20 0843 09/25/20 0519  NA 140  --  140  K 3.6  --  3.5  CL 108  --  107  CO2 25  --  27  GLUCOSE 154*  --  121*  BUN 18  --  13  CREATININE 0.70  --  0.86  CALCIUM 8.4*  --  8.9  MG  --  2.2  --    Liver Function Tests: Recent Labs  Lab 09/24/20 0224  AST 39  ALT 43  ALKPHOS 67  BILITOT 0.5  PROT 6.5  ALBUMIN 3.6   No results for input(s): LIPASE, AMYLASE in the last 168 hours. No results for input(s): AMMONIA in the last 168 hours. CBC: Recent Labs  Lab 09/24/20 0224 09/25/20 0519  WBC 6.6  6.5  NEUTROABS 3.8  --   HGB 15.1 15.6  HCT 42.9 45.0  MCV 90.1 90.2  PLT 179 179   Cardiac Enzymes: No results for input(s): CKTOTAL, CKMB, CKMBINDEX, TROPONINI in the last 168 hours. BNP: BNP (last 3 results) Recent Labs    09/24/20 0224  BNP 530.9*    ProBNP (last 3 results) No results for input(s): PROBNP in the last 8760 hours.  CBG: No results for input(s): GLUCAP in the last 168 hours.     Signed:  Desma Maxim MD.  [facility] 09/25/2020, 10:59 AM

## 2020-09-25 NOTE — Progress Notes (Signed)
[facility] Card[facility]    SUBJECTIVE: Mr. Blagg is a 69 year old male with a past medical history significant for non-obstructive CAD, diagnosed per [facility] in 2014 in [place], [place] and treated medically, hyperlipidemia, hypertension, and OSA, not on CPAP, who presented to the ED on 09/24/20 for chest tightness with associated weakness, instability, and shortness of breath.  Workup in the ED has been significant for high sensitivity troponin elevated x 2, 79, 70, 63, 53  respectively, BNP of 530, chest CT revealing aortic tortuosity with no evidence of an aneurysm and multifocal coronary atherosclerotic calcifications, and ECG revealing new onset atrial fibrillation with RVR.  He was started on a diltiazem drip and converted to NSR around 12pm on 09/24/20.   Mr. Cabello is followed in outpatient cardiology by Dr. Saralyn Pilar. Stress echocardiogram on 04/27/19 revealed normal RV and LV systolic function with a resting and stress EF estimated greater than 55% with moderate LVH; no significant valvular disease noted. No evidence of exercise induced ischemia.   09/24/20: On exam, Mr. Pomplun is lying in bed, comfortably, in no acute distress.  HR is fluctuating between upper 80s and low 100s.  Chest pain and shortness of breath have significantly improved since arrival to the ED.  He denies lower extremity swelling, orthopnea, or PND.  He has a history of OSA, but no longer has a CPAP machine.   09/25/20: Sitting up in bed, comfortably, in no acute distress.  Reports that he feels back to his baseline and denies chest pain, palpitations, shortness of breath, lower extremity swelling, orthopnea, PND, or syncopal episodes.  He is tolerating new medications without evidence of side effects.    Vitals:   09/25/20 0000 09/25/20 0100 09/25/20 0200 09/25/20 0600  BP:      Pulse:      Resp: 17 16 17    Temp:      TempSrc:      SpO2:      Weight:    115 kg  Height:         Intake/Output Summary (Last 24 hours) at  09/25/2020 0735 Last data filed at 09/24/2020 1506 Gross per 24 hour  Intake 24.08 ml  Output --  Net 24.08 ml      PHYSICAL EXAM  General: Well developed, well nourished, in no acute distress HEENT:  Normocephalic and atramatic Neck:  No JVD.  Lungs: Clear bilaterally to auscultation and percussion. Heart: HRRR . Normal S1 and S2 without gallops or murmurs.  Abdomen: Bowel sounds are positive, abdomen soft and non-tender  Msk:  Back normal.  Normal strength and tone for age. Extremities: No clubbing, cyanosis or edema.   Neuro: Alert and oriented X 3. Psych:  Good affect, responds appropriately   LABS: Basic Metabolic Panel: Recent Labs    09/24/20 0224 09/24/20 0843 09/25/20 0519  NA 140  --  140  K 3.6  --  3.5  CL 108  --  107  CO2 25  --  27  GLUCOSE 154*  --  121*  BUN 18  --  13  CREATININE 0.70  --  0.86  CALCIUM 8.4*  --  8.9  MG  --  2.2  --    Liver Function Tests: Recent Labs    09/24/20 0224  AST 39  ALT 43  ALKPHOS 67  BILITOT 0.5  PROT 6.5  ALBUMIN 3.6   No results for input(s): LIPASE, AMYLASE in the last 72 hours. CBC: Recent Labs    09/24/20 0224  09/25/20 0519  WBC 6.6 6.5  NEUTROABS 3.8  --   HGB 15.1 15.6  HCT 42.9 45.0  MCV 90.1 90.2  PLT 179 179   Cardiac Enzymes: No results for input(s): CKTOTAL, CKMB, CKMBINDEX, TROPONINI in the last 72 hours. BNP: Invalid input(s): POCBNP D-Dimer: No results for input(s): DDIMER in the last 72 hours. Hemoglobin A1C: Recent Labs    09/24/20 1038  HGBA1C 5.8*   Fasting Lipid Panel: No results for input(s): CHOL, HDL, LDLCALC, TRIG, CHOLHDL, LDLDIRECT in the last 72 hours. Thyroid Function Tests: Recent Labs    09/24/20 0357  TSH 5.284*   Anemia Panel: No results for input(s): VITAMINB12, FOLATE, FERRITIN, TIBC, IRON, RETICCTPCT in the last 72 hours.  DG Chest 2 View  Result Date: 09/24/2020 CLINICAL DATA:  Chest pain, shortness of breath and chest pressure EXAM: CHEST -  2 VIEW COMPARISON:  Radiograph 04/17/2020, CT 12/27/2014 FINDINGS: Low volumes and basilar atelectatic changes. Right lower lobe reticular opacities and chronic pleural thickening are similar to comparison radiograph and likely reflect a region of pleuroparenchymal scarring. No new consolidative opacity is evident. While the cardiomediastinal contours are unchanged from comparison radiograph with a calcified tortuous aorta. Prominence of the ascending aorta and arch on the lateral view is somewhat more conspicuous than on more remote comparison CT. Remaining cardiomediastinal contours are unremarkable. No acute osseous or soft tissue abnormality. Degenerative changes are present in the imaged spine and shoulders. IMPRESSION: 1. Low lung volumes with basilar atelectasis. 2. Right basilar pleuroparenchymal scarring. 3. Prominence of the ascending aorta and arch on the lateral view is more conspicuous than on remote comparison CT though similar to more recent comparison radiograph. Could reflect ascending aortic aneurysm or dilation and given acute symptoms may warrant further evaluation with CT angiography. Electronically Signed   By: Lovena Le M.D.   On: 09/24/2020 02:56   CT ANGIO CHEST AORTA W/CM & OR WO/CM  Result Date: 09/24/2020 CLINICAL DATA:  Chest pain with nonspecific x-ray possibly showing dilated aorta. EXAM: CT ANGIOGRAPHY CHEST WITH CONTRAST TECHNIQUE: Multidetector CT imaging of the chest was performed using the standard protocol during bolus administration of intravenous contrast. Multiplanar CT image reconstructions and MIPs were obtained to evaluate the vascular anatomy. CONTRAST:  187mL OMNIPAQUE IOHEXOL 350 MG/ML SOLN COMPARISON:  Chest x-ray from earlier today and chest CT 12/27/2014 FINDINGS: Cardiovascular: Normal heart size. No pericardial effusion. Aortic tortuosity. No intramural hematoma or dissection of the aorta. No aortic dissection. Atheromatous calcification of the coronary  arteries which is multifocal in the left and right circulation. Essentially non-opacified pulmonary arteries. Mediastinum/Nodes: Negative for adenopathy or mass. Lungs/Pleura: There is no edema, consolidation, effusion, or pneumothorax. Motion artifact at the apices. Upper Abdomen: 2 calcified gallstones. No superimposed inflammation. Musculoskeletal: No acute or aggressive finding. Thoracic spondylosis Review of the MIP images confirms the above findings. IMPRESSION: 1. No evidence of acute aortic syndrome. 2. The chest x-ray appearance is related to aortic tortuosity rather than aneurysm. 3. Multifocal coronary atherosclerotic calcification. 4. Cholelithiasis Electronically Signed   By: Monte Fantasia M.D.   On: 09/24/2020 04:32     Echo: Pending   TELEMETRY: Normal sinus rhythm, rate in the low 70s   ASSESSMENT AND PLAN:  Active Problems:   Acute CHF (congestive heart failure) ([place])    1.  New onset atrial fibrillation   -Converted to NSR around 12pm on 09/24/20  -Patient transitioned from diltiazem drip to oral diltiazem 240mg  daily   -Eliquis 5mg  BID initiated this  admission   -Echocardiogram results pending   -Would recommend outpatient follow up with Dr. Saralyn Pilar or Clabe Seal PA-C within 7-10 days of discharge   2.  History of CAD  -Ruled out for ACS this admission; troponin 79, 70, 63, 53 respectively - likely demand ischemia in the setting of new onset atrial fibrillation   -Atorvastatin 40mg  initiated this admission; continue aspirin 81mg  daily   3.  CHF  -BNP elevated - no previous BNP available for comparison   -Chest xray not revealing significant edema; patient appears euvolemic on exam   -Will transition to Lasix 40mg  once daily   4.  Obstructive sleep apnea  -Currently not on CPAP; would recommend repeat sleep study in an outpatient setting  5.  Hypertension   -Lisinopril-HCTZ held due this admission due to hypotension on diltiazem drip   -Elevated last night and  this morning; would recommend resuming lisinopril-HCTZ 10-12.5mg  daily and discharging on diltiazem 240mg  daily and Lasix 40mg  once daily   The history, physical exam findings, and plan of care were all discussed with Dr. Bartholome Bill, and all decision making was made in collaboration.   Avie Arenas  PA-C 09/25/2020 7:35 AM

## 2020-09-25 NOTE — Care Management CC44 (Signed)
Condition Code 44 Documentation Completed  Patient Details  Name: Matthew Jordan MRN: [id number] Date of Birth: 02-05-1951   Condition Code 44 given:  Yes Patient signature on Condition Code 44 notice:  Yes Documentation of 2 MD's agreement:  Yes Code 44 added to claim:  Yes    Gerrianne Scale Dustin Bumbaugh, LCSW 09/25/2020, 11:41 AM

## 2020-09-25 NOTE — Care Management Obs Status (Signed)
[place] NOTIFICATION   Patient Details  Name: Matthew Jordan MRN: [id number] Date of Birth: 1951/09/10   Medicare Observation Status Notification Given:  Yes    Gerrianne Scale Hasset Chaviano, LCSW 09/25/2020, 11:41 AM

## 2020-09-26 LAB — ECHOCARDIOGRAM COMPLETE
AR max vel: 2.28 cm2
AV Area VTI: 1.89 cm2
AV Area mean vel: 2.49 cm2
AV Mean grad: 2 mmHg
AV Peak grad: 3.5 mmHg
Ao pk vel: 0.93 m/s
Area-P 1/2: 3.97 cm2
Height: 70 in
S' Lateral: 2.27 cm
Weight: 4056.46 oz

## 2020-09-26 LAB — T3: T3, Total: 136 ng/dL (ref 71–180)

## 2020-10-02 DIAGNOSIS — I480 Paroxysmal atrial fibrillation: Secondary | ICD-10-CM | POA: Insufficient documentation

## 2020-10-07 ENCOUNTER — Ambulatory Visit: Payer: Medicare Other | Admitting: Family

## 2020-10-07 ENCOUNTER — Telehealth: Payer: Self-pay | Admitting: Family

## 2020-10-07 NOTE — Telephone Encounter (Signed)
Patient did not show for his [facility] appointment on 10/07/20. Will attempt to reschedule.

## 2020-10-07 NOTE — Progress Notes (Deleted)
   Patient ID: Matthew Jordan, male    DOB: 1951/09/14, 69 y.o.   MRN: [id number]   [facility] interpreter present during entire visit   HPI  Matthew Jordan is a 69 y/o male with a history of  Echo report from 09/25/20 reviewed and showed an EF of >75% without structural changes.   Review of Systems    Physical Exam    Assessment & Plan:  1: Chronic heart failure with preserved ejection fraction without structural changes- - NYHA class  2:

## 2020-10-16 ENCOUNTER — Encounter: Payer: Self-pay | Admitting: Adult Health

## 2020-10-16 ENCOUNTER — Ambulatory Visit
Admission: RE | Admit: 2020-10-16 | Discharge: 2020-10-16 | Disposition: A | Discharge status: Home / Self Care | Payer: Medicare Other | Attending: Adult Health | Admitting: Adult Health

## 2020-10-16 ENCOUNTER — Ambulatory Visit
Admission: RE | Admit: 2020-10-16 | Discharge: 2020-10-16 | Disposition: A | Discharge status: Home / Self Care | Payer: Medicare Other | Source: Ambulatory Visit | Attending: Adult Health | Admitting: Adult Health

## 2020-10-16 ENCOUNTER — Other Ambulatory Visit: Payer: Self-pay

## 2020-10-16 ENCOUNTER — Ambulatory Visit (INDEPENDENT_AMBULATORY_CARE_PROVIDER_SITE_OTHER): Payer: Medicare Other | Admitting: Adult Health

## 2020-10-16 VITALS — BP 114/52 | HR 71 | Temp 97.6°F | Resp 16 | Wt 256.6 lb

## 2020-10-16 DIAGNOSIS — I2583 Coronary atherosclerosis due to lipid rich plaque: Secondary | ICD-10-CM

## 2020-10-16 DIAGNOSIS — H409 Unspecified glaucoma: Secondary | ICD-10-CM | POA: Diagnosis not present

## 2020-10-16 DIAGNOSIS — M25552 Pain in left hip: Secondary | ICD-10-CM | POA: Insufficient documentation

## 2020-10-16 DIAGNOSIS — G8929 Other chronic pain: Secondary | ICD-10-CM | POA: Insufficient documentation

## 2020-10-16 DIAGNOSIS — E785 Hyperlipidemia, unspecified: Secondary | ICD-10-CM

## 2020-10-16 DIAGNOSIS — Z1389 Encounter for screening for other disorder: Secondary | ICD-10-CM | POA: Diagnosis not present

## 2020-10-16 DIAGNOSIS — M25562 Pain in left knee: Secondary | ICD-10-CM | POA: Insufficient documentation

## 2020-10-16 DIAGNOSIS — I509 Heart failure, unspecified: Secondary | ICD-10-CM

## 2020-10-16 DIAGNOSIS — H547 Unspecified visual loss: Secondary | ICD-10-CM

## 2020-10-16 DIAGNOSIS — H0100A Unspecified blepharitis right eye, upper and lower eyelids: Secondary | ICD-10-CM

## 2020-10-16 DIAGNOSIS — Z6836 Body mass index (BMI) 36.0-36.9, adult: Secondary | ICD-10-CM

## 2020-10-16 DIAGNOSIS — I2510 Atherosclerotic heart disease of native coronary artery without angina pectoris: Secondary | ICD-10-CM | POA: Diagnosis not present

## 2020-10-16 DIAGNOSIS — I10 Essential (primary) hypertension: Secondary | ICD-10-CM

## 2020-10-16 DIAGNOSIS — H0100B Unspecified blepharitis left eye, upper and lower eyelids: Secondary | ICD-10-CM

## 2020-10-16 DIAGNOSIS — N401 Enlarged prostate with lower urinary tract symptoms: Secondary | ICD-10-CM

## 2020-10-16 DIAGNOSIS — H6090 Unspecified otitis externa, unspecified ear: Secondary | ICD-10-CM

## 2020-10-16 DIAGNOSIS — N529 Male erectile dysfunction, unspecified: Secondary | ICD-10-CM

## 2020-10-16 DIAGNOSIS — R972 Elevated prostate specific antigen [PSA]: Secondary | ICD-10-CM

## 2020-10-16 DIAGNOSIS — Z1211 Encounter for screening for malignant neoplasm of colon: Secondary | ICD-10-CM

## 2020-10-16 LAB — POCT URINALYSIS DIPSTICK
Blood, UA: NEGATIVE
Glucose, UA: NEGATIVE
Ketones, UA: NEGATIVE
Leukocytes, UA: NEGATIVE
Nitrite, UA: NEGATIVE
Protein, UA: NEGATIVE
Spec Grav, UA: <=1.005 — AB (ref 1.010–1.025)
Urobilinogen, UA: 0.2 E.U./dL
pH, UA: 7.5 (ref 5.0–8.0)

## 2020-10-16 MED ORDER — APIXABAN 5 MG PO TABS: 5.0000 mg | ORAL_TABLET | Freq: Two times a day (BID) | ORAL | 0 refills | Status: DC

## 2020-10-16 MED ORDER — LISINOPRIL-HYDROCHLOROTHIAZIDE 10-12.5 MG PO TABS: 1.0000 | ORAL_TABLET | Freq: Every day | ORAL | 0 refills | Status: DC

## 2020-10-16 MED ORDER — ATORVASTATIN CALCIUM 40 MG PO TABS: 40.0000 mg | ORAL_TABLET | Freq: Every day | ORAL | 0 refills | Status: DC

## 2020-10-16 MED ORDER — FUROSEMIDE 40 MG PO TABS: 40.0000 mg | ORAL_TABLET | Freq: Every day | ORAL | 0 refills | Status: DC

## 2020-10-16 MED ORDER — NEOMYCIN-POLYMYXIN-HC 3.5-10000-1 OT SOLN: 3.0000 [drp] | Freq: Three times a day (TID) | OTIC | 1 refills | Status: AC

## 2020-10-16 MED ORDER — DILTIAZEM HCL ER COATED BEADS 240 MG PO CP24: 240.0000 mg | ORAL_CAPSULE | Freq: Every day | ORAL | 0 refills | Status: DC

## 2020-10-16 MED ORDER — HYDROCORTISONE-ACETIC ACID 1-2 % OT SOLN: 3.0000 [drp] | Freq: Three times a day (TID) | OTIC | 1 refills | Status: DC

## 2020-10-16 MED ORDER — TAMSULOSIN HCL 0.4 MG PO CAPS: 0.4000 mg | ORAL_CAPSULE | Freq: Every day | ORAL | 0 refills | Status: DC

## 2020-10-16 MED ORDER — ERYTHROMYCIN 5 MG/GM OP OINT: 1.0000 "application " | TOPICAL_OINTMENT | Freq: Four times a day (QID) | OPHTHALMIC | 0 refills | Status: DC

## 2020-10-16 NOTE — Patient Instructions (Addendum)
Prostate Cancer Screening  Prostate cancer screening is a test that is done to check for the presence of prostate cancer in men. The prostate gland is a walnut-sized gland that is located below the bladder and in front of the rectum in males. The function of the prostate is to add fluid to semen during ejaculation. Prostate cancer is the second most common type of cancer in men. Who should have prostate cancer screening?  Screening recommendations vary based on age and other risk factors. Screening is recommended if:  You are older than age 28. If you are age 47-69, talk with your health care provider about your need for screening and how often screening should be done. Because most prostate cancers are slow growing and will not cause death, screening is generally reserved in this age group for men who have a 10-15-year life expectancy.  You are younger than age 26, and you have these risk factors: ? Being a black male or a male of African descent. ? Having a father, brother, or uncle who has been diagnosed with prostate cancer. The risk is higher if your family member's cancer occurred at an early age. Screening is not recommended if:  You are younger than age 84.  You are between the ages of 69 and 34 and you have no risk factors.  You are 100 years of age or older. At this age, the risks that screening can cause are greater than the benefits that it may provide. If you are at high risk for prostate cancer, your health care provider may recommend that you have screenings more often or that you start screening at a younger age. How is screening for prostate cancer done? The recommended prostate cancer screening test is a blood test called the prostate-specific antigen (PSA) test. PSA is a protein that is made in the prostate. As you age, your prostate naturally produces more PSA. Abnormally high PSA levels may be caused by:  Prostate cancer.  An enlarged prostate that is not caused by  cancer (benign prostatic hyperplasia, BPH). This condition is very common in older men.  A prostate gland infection (prostatitis). Depending on the PSA results, you may need more tests, such as:  A physical exam to check the size of your prostate gland.  Blood and imaging tests.  A procedure to remove tissue samples from your prostate gland for testing (biopsy). What are the benefits of prostate cancer screening?  Screening can help to identify cancer at an early stage, before symptoms start and when the cancer can be treated more easily.  There is a small chance that screening may lower your risk of dying from prostate cancer. The chance is small because prostate cancer is a slow-growing cancer, and most men with prostate cancer die from a different cause. What are the risks of prostate cancer screening? The main risk of prostate cancer screening is diagnosing and treating prostate cancer that would never have caused any symptoms or problems. This is called overdiagnosisand overtreatment. PSA screening cannot tell you if your PSA is high due to cancer or a different cause. A prostate biopsy is the only procedure to diagnose prostate cancer. Even the results of a biopsy may not tell you if your cancer needs to be treated. Slow-growing prostate cancer may not need any treatment other than monitoring, so diagnosing and treating it may cause unnecessary stress or other side effects. A prostate biopsy may also cause:  Infection or fever.  A false negative. This  is a result that shows that you do not have prostate cancer when you actually do have prostate cancer. Questions to ask your health care provider  When should I start prostate cancer screening?  What is my risk for prostate cancer?  How often do I need screening?  What type of screening tests do I need?  How do I get my test results?  What do my results mean?  Do I need treatment? Where to find more information  The American  Cancer Society: www.cancer.org  American Urological Association: www.auanet.org Contact a health care provider if:  You have difficulty urinating.  You have pain when you urinate or ejaculate.  You have blood in your urine or semen.  You have pain in your back or in the area of your prostate. Summary  Prostate cancer is a common type of cancer in men. The prostate gland is located below the bladder and in front of the rectum. This gland adds fluid to semen during ejaculation.  Prostate cancer screening may identify cancer at an early stage, when the cancer can be treated more easily.  The prostate-specific antigen (PSA) test is the recommended screening test for prostate cancer.  Discuss the risks and benefits of prostate cancer screening with your health care provider. If you are age 47 or older, the risks that screening can cause are greater than the benefits that it may provide. This information is not intended to replace advice given to you by your health care provider. Make sure you discuss any questions you have with your health care provider. Document Revised: 06/14/2019 Document Reviewed: 06/14/2019 Elsevier Patient Education  [place] Maintenance, Male Adopting a healthy lifestyle and getting preventive care are important in promoting health and wellness. Ask your health care provider about:  The right schedule for you to have regular tests and exams.  Things you can do on your own to prevent diseases and keep yourself healthy. What should I know about diet, weight, and exercise? Eat a healthy diet   Eat a diet that includes plenty of vegetables, fruits, low-fat dairy products, and lean protein.  Do not eat a lot of foods that are high in solid fats, added sugars, or sodium. Maintain a healthy weight Body mass index (BMI) is a measurement that can be used to identify possible weight problems. It estimates body fat based on height and weight. Your  health care provider can help determine your BMI and help you achieve or maintain a healthy weight. Get regular exercise Get regular exercise. This is one of the most important things you can do for your health. Most adults should:  Exercise for at least 150 minutes each week. The exercise should increase your heart rate and make you sweat (moderate-intensity exercise).  Do strengthening exercises at least twice a week. This is in addition to the moderate-intensity exercise.  Spend less time sitting. Even light physical activity can be beneficial. Watch cholesterol and blood lipids Have your blood tested for lipids and cholesterol at 71 years of age, then have this test every 5 years. You may need to have your cholesterol levels checked more often if:  Your lipid or cholesterol levels are high.  You are older than 71 years of age.  You are at high risk for heart disease. What should I know about cancer screening? Many types of cancers can be detected early and may often be prevented. Depending on your health history and family history, you may need to  have cancer screening at various ages. This may include screening for:  Colorectal cancer.  Prostate cancer.  Skin cancer.  Lung cancer. What should I know about heart disease, diabetes, and high blood pressure? Blood pressure and heart disease  High blood pressure causes heart disease and increases the risk of stroke. This is more likely to develop in people who have high blood pressure readings, are of African descent, or are overweight.  Talk with your health care provider about your target blood pressure readings.  Have your blood pressure checked: ? Every 3-5 years if you are 34-3 years of age. ? Every year if you are 70 years old or older.  If you are between the ages of 63 and 77 and are a current or former smoker, ask your health care provider if you should have a one-time screening for abdominal aortic aneurysm  (AAA). Diabetes Have regular diabetes screenings. This checks your fasting blood sugar level. Have the screening done:  Once every three years after age 63 if you are at a normal weight and have a low risk for diabetes.  More often and at a younger age if you are overweight or have a high risk for diabetes. What should I know about preventing infection? Hepatitis B If you have a higher risk for hepatitis B, you should be screened for this virus. Talk with your health care provider to find out if you are at risk for hepatitis B infection. Hepatitis C Blood testing is recommended for:  Everyone born from [birth year] through 1967.  Anyone with known risk factors for hepatitis C. Sexually transmitted infections (STIs)  You should be screened each year for STIs, including gonorrhea and chlamydia, if: ? You are sexually active and are younger than 71 years of age. ? You are older than 71 years of age and your health care provider tells you that you are at risk for this type of infection. ? Your sexual activity has changed since you were last screened, and you are at increased risk for chlamydia or gonorrhea. Ask your health care provider if you are at risk.  Ask your health care provider about whether you are at high risk for HIV. Your health care provider may recommend a prescription medicine to help prevent HIV infection. If you choose to take medicine to prevent HIV, you should first get tested for HIV. You should then be tested every 3 months for as long as you are taking the medicine. Follow these instructions at home: Lifestyle  Do not use any products that contain nicotine or tobacco, such as cigarettes, e-cigarettes, and chewing tobacco. If you need help quitting, ask your health care provider.  Do not use street drugs.  Do not share needles.  Ask your health care provider for help if you need support or information about quitting drugs. Alcohol use  Do not drink alcohol if your health  care provider tells you not to drink.  If you drink alcohol: ? Limit how much you have to 0-2 drinks a day. ? Be aware of how much alcohol is in your drink. In the [place] one drink equals one 12 oz bottle of beer (355 mL), one 5 oz glass of wine (148 mL), or one 1 oz glass of hard liquor (44 mL). General instructions  Schedule regular health, dental, and eye exams.  Stay current with your vaccines.  Tell your health care provider if: ? You often feel depressed. ? You have ever been abused or do not  feel safe at home. Summary  Adopting a healthy lifestyle and getting preventive care are important in promoting health and wellness.  Follow your health care provider's instructions about healthy diet, exercising, and getting tested or screened for diseases.  Follow your health care provider's instructions on monitoring your cholesterol and blood pressure. This information is not intended to replace advice given to you by your health care provider. Make sure you discuss any questions you have with your health care provider. Document Revised: 10/25/2018 Document Reviewed: 10/25/2018 Elsevier Patient Education  [place].  Hip Pain The hip is the joint between the upper legs and the lower pelvis. The bones, cartilage, tendons, and muscles of your hip joint support your body and allow you to move around. Hip pain can range from a minor ache to severe pain in one or both of your hips. The pain may be felt on the inside of the hip joint near the groin, or on the outside near the buttocks and upper thigh. You may also have swelling or stiffness in your hip area. Follow these instructions at home: Managing pain, stiffness, and swelling      If directed, put ice on the painful area. To do this: ? Put ice in a plastic bag. ? Place a towel between your skin and the bag. ? Leave the ice on for 20 minutes, 2-3 times a day.  If directed, apply heat to the affected area as often as told  by your health care provider. Use the heat source that your health care provider recommends, such as a moist heat pack or a heating pad. ? Place a towel between your skin and the heat source. ? Leave the heat on for 20-30 minutes. ? Remove the heat if your skin turns bright red. This is especially important if you are unable to feel pain, heat, or cold. You may have a greater risk of getting burned. Activity  Do exercises as told by your health care provider.  Avoid activities that cause pain. General instructions   Take over-the-counter and prescription medicines only as told by your health care provider.  Keep a journal of your symptoms. Write down: ? How often you have hip pain. ? The location of your pain. ? What the pain feels like. ? What makes the pain worse.  Sleep with a pillow between your legs on your most comfortable side.  Keep all follow-up visits as told by your health care provider. This is important. Contact a health care provider if:  You cannot put weight on your leg.  Your pain or swelling continues or gets worse after one week.  It gets harder to walk.  You have a fever. Get help right away if:  You fall.  You have a sudden increase in pain and swelling in your hip.  Your hip is red or swollen or very tender to touch. Summary  Hip pain can range from a minor ache to severe pain in one or both of your hips.  The pain may be felt on the inside of the hip joint near the groin, or on the outside near the buttocks and upper thigh.  Avoid activities that cause pain.  Write down how often you have hip pain, the location of the pain, what makes it worse, and what it feels like. This information is not intended to replace advice given to you by your health care provider. Make sure you discuss any questions you have with your health care provider.  Document Revised: 03/19/2019 Document Reviewed: 03/19/2019 Elsevier Patient Education  [place] for [employer] Calories are units of energy. Your body needs a certain amount of calories from food to keep you going throughout the day. When you eat more calories than your body needs, your body stores the extra calories as fat. When you eat fewer calories than your body needs, your body burns fat to get the energy it needs. Calorie counting means keeping track of how many calories you eat and drink each day. Calorie counting can be helpful if you need to lose weight. If you make sure to eat fewer calories than your body needs, you should lose weight. Ask your health care provider what a healthy weight is for you. For calorie counting to work, you will need to eat the right number of calories in a day in order to lose a healthy amount of weight per week. A dietitian can help you determine how many calories you need in a day and will give you suggestions on how to reach your calorie goal.  A healthy amount of weight to lose per week is usually 1-2 lb (0.5-0.9 kg). This usually means that your daily calorie intake should be reduced by 500-750 calories.  Eating 1,200 - 1,500 calories per day can help most women lose weight.  Eating 1,500 - 1,800 calories per day can help most men lose weight. What is my plan? My goal is to have __________ calories per day. If I have this many calories per day, I should lose around __________ pounds per week. What do I need to know about calorie counting? In order to meet your daily calorie goal, you will need to:  Find out how many calories are in each food you would like to eat. Try to do this before you eat.  Decide how much of the food you plan to eat.  Write down what you ate and how many calories it had. Doing this is called keeping a food log. To successfully lose weight, it is important to balance calorie counting with a healthy lifestyle that includes regular activity. Aim for 150 minutes of moderate exercise (such as walking) or  75 minutes of vigorous exercise (such as running) each week. Where do I find calorie information?  The number of calories in a food can be found on a Nutrition Facts label. If a food does not have a Nutrition Facts label, try to look up the calories online or ask your dietitian for help. Remember that calories are listed per serving. If you choose to have more than one serving of a food, you will have to multiply the calories per serving by the amount of servings you plan to eat. For example, the label on a package of bread might say that a serving size is 1 slice and that there are 90 calories in a serving. If you eat 1 slice, you will have eaten 90 calories. If you eat 2 slices, you will have eaten 180 calories. How do I keep a food log? Immediately after each meal, record the following information in your food log:  What you ate. Don't forget to include toppings, sauces, and other extras on the food.  How much you ate. This can be measured in cups, ounces, or number of items.  How many calories each food and drink had.  The total number of calories in the meal. Keep your food log near you, such as in a small  notebook in your pocket, or use a mobile app or website. Some programs will calculate calories for you and show you how many calories you have left for the day to meet your goal. What are some calorie counting tips?   Use your calories on foods and drinks that will fill you up and not leave you hungry: ? Some examples of foods that fill you up are nuts and nut butters, vegetables, lean proteins, and high-fiber foods like whole grains. High-fiber foods are foods with more than 5 g fiber per serving. ? Drinks such as sodas, specialty coffee drinks, alcohol, and juices have a lot of calories, yet do not fill you up.  Eat nutritious foods and avoid empty calories. Empty calories are calories you get from foods or beverages that do not have many vitamins or protein, such as candy, sweets,  and soda. It is better to have a nutritious high-calorie food (such as an avocado) than a food with few nutrients (such as a bag of chips).  Know how many calories are in the foods you eat most often. This will help you calculate calorie counts faster.  Pay attention to calories in drinks. Low-calorie drinks include water and unsweetened drinks.  Pay attention to nutrition labels for "low fat" or "fat free" foods. These foods sometimes have the same amount of calories or more calories than the full fat versions. They also often have added sugar, starch, or salt, to make up for flavor that was removed with the fat.  Find a way of tracking calories that works for you. Get creative. Try different apps or programs if writing down calories does not work for you. What are some portion control tips?  Know how many calories are in a serving. This will help you know how many servings of a certain food you can have.  Use a measuring cup to measure serving sizes. You could also try weighing out portions on a kitchen scale. With time, you will be able to estimate serving sizes for some foods.  Take some time to put servings of different foods on your favorite plates, bowls, and cups so you know what a serving looks like.  Try not to eat straight from a bag or box. Doing this can lead to overeating. Put the amount you would like to eat in a cup or on a plate to make sure you are eating the right portion.  Use smaller plates, glasses, and bowls to prevent overeating.  Try not to multitask (for example, watch TV or use your computer) while eating. If it is time to eat, sit down at a table and enjoy your food. This will help you to know when you are full. It will also help you to be aware of what you are eating and how much you are eating. What are tips for following this plan? Reading food labels  Check the calorie count compared to the serving size. The serving size may be smaller than what you are used  to eating.  Check the source of the calories. Make sure the food you are eating is high in vitamins and protein and low in saturated and trans fats. Shopping  Read nutrition labels while you shop. This will help you make healthy decisions before you decide to purchase your food.  Make a grocery list and stick to it. Cooking  Try to cook your favorite foods in a healthier way. For example, try baking instead of frying.  Use low-fat dairy products.  Meal planning  Use more fruits and vegetables. Half of your plate should be fruits and vegetables.  Include lean proteins like poultry and fish. How do I count calories when eating out?  Ask for smaller portion sizes.  Consider sharing an entree and sides instead of getting your own entree.  If you get your own entree, eat only half. Ask for a box at the beginning of your meal and put the rest of your entree in it so you are not tempted to eat it.  If calories are listed on the menu, choose the lower calorie options.  Choose dishes that include vegetables, fruits, whole grains, low-fat dairy products, and lean protein.  Choose items that are boiled, broiled, grilled, or steamed. Stay away from items that are buttered, battered, fried, or served with cream sauce. Items labeled "crispy" are usually fried, unless stated otherwise.  Choose water, low-fat milk, unsweetened iced tea, or other drinks without added sugar. If you want an alcoholic beverage, choose a lower calorie option such as a glass of wine or light beer.  Ask for dressings, sauces, and syrups on the side. These are usually high in calories, so you should limit the amount you eat.  If you want a salad, choose a garden salad and ask for grilled meats. Avoid extra toppings like bacon, cheese, or fried items. Ask for the dressing on the side, or ask for olive oil and vinegar or lemon to use as dressing.  Estimate how many servings of a food you are given. For example, a serving  of cooked rice is  cup or about the size of half a baseball. Knowing serving sizes will help you be aware of how much food you are eating at restaurants. The list below tells you how big or small some common portion sizes are based on everyday objects: ? 1 oz--4 stacked dice. ? 3 oz--1 deck of cards. ? 1 tsp--1 die. ? 1 Tbsp-- a ping-pong ball. ? 2 Tbsp--1 ping-pong ball. ?  cup-- baseball. ? 1 cup--1 baseball. Summary  Calorie counting means keeping track of how many calories you eat and drink each day. If you eat fewer calories than your body needs, you should lose weight.  A healthy amount of weight to lose per week is usually 1-2 lb (0.5-0.9 kg). This usually means reducing your daily calorie intake by 500-750 calories.  The number of calories in a food can be found on a Nutrition Facts label. If a food does not have a Nutrition Facts label, try to look up the calories online or ask your dietitian for help.  Use your calories on foods and drinks that will fill you up, and not on foods and drinks that will leave you hungry.  Use smaller plates, glasses, and bowls to prevent overeating. This information is not intended to replace advice given to you by your health care provider. Make sure you discuss any questions you have with your health care provider. Document Revised: 07/21/2018 Document Reviewed: 10/01/2016 Elsevier Patient Education  [place] and Cholesterol Restricted Eating Plan Getting too much fat and cholesterol in your diet may cause health problems. Choosing the right foods helps keep your fat and cholesterol at normal levels. This can keep you from getting certain diseases. Your doctor may recommend an eating plan that includes:  Total fat: ______% or less of total calories a day.  Saturated fat: ______% or less of total calories a day.  Cholesterol: less than  _________mg a day.  Fiber: ______g a day. What are tips for following this plan? Meal  planning  At meals, divide your plate into four equal parts: ? Fill one-half of your plate with vegetables and green salads. ? Fill one-fourth of your plate with whole grains. ? Fill one-fourth of your plate with low-fat (lean) protein foods.  Eat fish that is high in omega-3 fats at least two times a week. This includes mackerel, tuna, sardines, and salmon.  Eat foods that are high in fiber, such as whole grains, beans, apples, broccoli, carrots, peas, and barley. General tips   Work with your doctor to lose weight if you need to.  Avoid: ? Foods with added sugar. ? Fried foods. ? Foods with partially hydrogenated oils.  Limit alcohol intake to no more than 1 drink a day for nonpregnant women and 2 drinks a day for men. One drink equals 12 oz of beer, 5 oz of wine, or 1 oz of hard liquor. Reading food labels  Check food labels for: ? Trans fats. ? Partially hydrogenated oils. ? Saturated fat (g) in each serving. ? Cholesterol (mg) in each serving. ? Fiber (g) in each serving.  Choose foods with healthy fats, such as: ? Monounsaturated fats. ? Polyunsaturated fats. ? Omega-3 fats.  Choose grain products that have whole grains. Look for the word "whole" as the first word in the ingredient list. Cooking  Cook foods using low-fat methods. These include baking, boiling, grilling, and broiling.  Eat more home-cooked foods. Eat at restaurants and buffets less often.  Avoid cooking using saturated fats, such as butter, cream, palm oil, palm kernel oil, and coconut oil. Recommended foods  Fruits  All fresh, canned (in natural juice), or frozen fruits. Vegetables  Fresh or frozen vegetables (raw, steamed, roasted, or grilled). Green salads. Grains  Whole grains, such as whole wheat or whole grain breads, crackers, cereals, and pasta. Unsweetened oatmeal, bulgur, barley, quinoa, or brown rice. Corn or whole wheat flour tortillas. Meats and other protein foods  Ground  beef (85% or leaner), grass-fed beef, or beef trimmed of fat. Skinless chicken or [place]. Ground chicken or [place]. Pork trimmed of fat. All fish and seafood. Egg whites. Dried beans, peas, or lentils. Unsalted nuts or seeds. Unsalted canned beans. Nut butters without added sugar or oil. Dairy  Low-fat or nonfat dairy products, such as skim or 1% milk, 2% or reduced-fat cheeses, low-fat and fat-free ricotta or cottage cheese, or plain low-fat and nonfat yogurt. Fats and oils  Tub margarine without trans fats. Light or reduced-fat mayonnaise and salad dressings. Avocado. Olive, canola, sesame, or safflower oils. The items listed above may not be a complete list of foods and beverages you can eat. Contact a dietitian for more information. Foods to avoid Fruits  Canned fruit in heavy syrup. Fruit in cream or butter sauce. Fried fruit. Vegetables  Vegetables cooked in cheese, cream, or butter sauce. Fried vegetables. Grains  White bread. White pasta. White rice. Cornbread. Bagels, pastries, and croissants. Crackers and snack foods that contain trans fat and hydrogenated oils. Meats and other protein foods  Fatty cuts of meat. Ribs, chicken wings, bacon, sausage, bologna, salami, chitterlings, fatback, hot dogs, bratwurst, and packaged lunch meats. Liver and organ meats. Whole eggs and egg yolks. Chicken and [place] with skin. Fried meat. Dairy  Whole or 2% milk, cream, half-and-half, and cream cheese. Whole milk cheeses. Whole-fat or sweetened yogurt. Full-fat cheeses. Nondairy creamers and whipped toppings. Processed cheese, cheese spreads,  and cheese curds. Beverages  Alcohol. Sugar-sweetened drinks such as sodas, lemonade, and fruit drinks. Fats and oils  Butter, stick margarine, lard, shortening, ghee, or bacon fat. Coconut, palm kernel, and palm oils. Sweets and desserts  Corn syrup, sugars, honey, and molasses. Candy. Jam and jelly. Syrup. Sweetened cereals. Cookies, pies, cakes,  donuts, muffins, and ice cream. The items listed above may not be a complete list of foods and beverages you should avoid. Contact a dietitian for more information. Summary  Choosing the right foods helps keep your fat and cholesterol at normal levels. This can keep you from getting certain diseases.  At meals, fill one-half of your plate with vegetables and green salads.  Eat high-fiber foods, like whole grains, beans, apples, carrots, peas, and barley.  Limit added sugar, saturated fats, alcohol, and fried foods. This information is not intended to replace advice given to you by your health care provider. Make sure you discuss any questions you have with your health care provider. Document Revised: 07/05/2018 Document Reviewed: 07/19/2017 Elsevier Patient Education  [employer].

## 2020-10-16 NOTE — Progress Notes (Signed)
Hip x ray is within normal limits- left hip.  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  Ok to place referral to orthopedic of choice.

## 2020-10-16 NOTE — Progress Notes (Signed)
Ok urine, small trace bilirubin - labs ordered.

## 2020-10-16 NOTE — Progress Notes (Signed)
New patient visit   Patient: Matthew Jordan   DOB: Oct 15, 1951   69 y.o. Male  MRN: [id number] Visit Date: 10/16/2020  Today's healthcare provider: Marcille Buffy, FNP   Chief Complaint  Patient presents with  . New Patient (Initial Visit)   Subjective    MYLZ YUAN is a 69 y.o. male who presents today as a new patient to establish care.  HPI  Patient is accompanied with his daughter today, she states that patient feels fairly well today. Patient and his daughter would like to address hearing and vision impairment and recurring falls that have caused patients balance to be off.Patients daughter states that patient has a history of sleep apnea and would like to discuss getting a machine to help patient through the night, patient states that's on average he sleeps 6-7hrs a night. Patient reports that he follows a well balanced diet and is staying active by exercising daily, patient reports that he is up to date on his flu vaccine.    He has been having vision changes x 3 years. He reports he has been seeing doctors and was diagnosed with glaucoma five years ago. He says he has cloudy vision for 5 years. Eyes " hurt" all the time  for 5- 10 years ago. He needs an opthamologist moved from [place], has been a year since seen an eye doctor.   Patient  denies any feve, chills, rash, chest pain, shortness of breath, nausea, vomiting, or diarrhea.   Denies dizziness, lightheadedness, pre syncopal or syncopal episodes.   Past Medical History:  Diagnosis Date  . Arthritis   . BPH with urinary obstruction   . CAD (coronary artery disease) Fall 2014   40% stenosis of LAD on cardiac cath  . CAD (coronary artery disease)   . Cardiac arrhythmia   . Cardiac dysrhythmia   . Chest pain   . CHF (congestive heart failure) ([place])   . Dysphagia   . ED (erectile dysfunction)   . Elevated PSA   . GERD (gastroesophageal reflux disease)   . Hypertension   . Irregular heart beat     . Left ventricular hypertrophy Fall 2014   mild concentric  . Prostate finding   . Sleep apnea   . SOB (shortness of breath)   . SOB (shortness of breath)    Past Surgical History:  Procedure Laterality Date  . CARDIAC CATHETERIZATION  2014   [place]  . HERNIA REPAIR     Family Status  Relation Name Status  . Mother  Deceased       lung disease  . Father  Deceased       possible prostate cancer  . Neg Hx  (Not Specified)   Family History  Problem Relation Age of Onset  . Hypertension Mother   . Lung disease Mother   . Hypertension Father   . Cancer Father        possible prostate cancer  . Kidney disease Neg Hx    Social History   Socioeconomic History  . Marital status: Divorced    Spouse name: Not on file  . Number of children: Not on file  . Years of education: Not on file  . Highest education level: Not on file  Occupational History  . Not on file  Tobacco Use  . Smoking status: Former Smoker    Packs/day: 0.25    Years: 10.00    Pack years: 2.50    Types: Cigarettes  Quit date: 10/02/1980    Years since quitting: 40.0  . Smokeless tobacco: Never Used  Vaping Use  . Vaping Use: Never used  Substance and Sexual Activity  . Alcohol use: Yes    Comment: weekly  . Drug use: No  . Sexual activity: Not on file  Other Topics Concern  . Not on file  Social History Narrative  . Not on file   Social Determinants of Health   Financial Resource Strain:   . Difficulty of Paying Living Expenses: Not on file  Food Insecurity:   . Worried About Charity fundraiser in the Last Year: Not on file  . Ran Out of Food in the Last Year: Not on file  Transportation Needs:   . Lack of Transportation (Medical): Not on file  . Lack of Transportation (Non-Medical): Not on file  Physical Activity:   . Days of Exercise per Week: Not on file  . Minutes of Exercise per Session: Not on file  Stress:   . Feeling of Stress : Not on file  Social Connections:   .  Frequency of Communication with Friends and Family: Not on file  . Frequency of Social Gatherings with Friends and Family: Not on file  . Attends Religious Services: Not on file  . Active Member of Clubs or Organizations: Not on file  . Attends Archivist Meetings: Not on file  . Marital Status: Not on file   Outpatient Medications Prior to Visit  Medication Sig  . [DISCONTINUED] apixaban (ELIQUIS) 5 MG TABS tablet Take 1 tablet (5 mg total) by mouth 2 (two) times daily.  . [DISCONTINUED] aspirin EC 81 MG tablet Take 81 mg by mouth daily.  . [DISCONTINUED] atorvastatin (LIPITOR) 40 MG tablet Take 1 tablet (40 mg total) by mouth daily.  . [DISCONTINUED] diltiazem (CARDIZEM CD) 240 MG 24 hr capsule Take 1 capsule (240 mg total) by mouth daily.  . [DISCONTINUED] furosemide (LASIX) 40 MG tablet Take 1 tablet (40 mg total) by mouth daily.  . [DISCONTINUED] lisinopril-hydrochlorothiazide (ZESTORETIC) 10-12.5 MG tablet Take 1 tablet by mouth daily.  . [DISCONTINUED] tamsulosin (FLOMAX) 0.4 MG CAPS capsule Take 0.4 mg by mouth daily.   . [DISCONTINUED] finasteride (PROSCAR) 5 MG tablet Take 1 tablet (5 mg total) by mouth daily. (Patient not taking: Reported on 09/24/2020)  . [DISCONTINUED] lisinopril (ZESTRIL) 10 MG tablet Take 1 tablet (10 mg total) by mouth daily.  . [DISCONTINUED] sildenafil (VIAGRA) 100 MG tablet Take 1 tablet (100 mg total) by mouth daily as needed for erectile dysfunction. (Patient not taking: Reported on 09/24/2020)   No facility-administered medications prior to visit.   Allergies  Allergen Reactions  . Amoxicillin     Immunization History  Administered Date(s) Administered  . Fluad Quad(high Dose 65+) 09/25/2020  . PFIZER SARS-COV-2 Vaccination 12/04/2019, 12/24/2019    Health Maintenance  Topic Date Due  . Hepatitis C Screening  Never done  . TETANUS/TDAP  Never done  . COLONOSCOPY  Never done  . PNA vac Low Risk Adult (1 of 2 - PCV13) Never done  .  INFLUENZA VACCINE  Completed  . COVID-19 Vaccine  Completed    Patient Care Team: Kateleen Encarnacion, Kelby Aline, FNP as PCP - General (Family Medicine) Laneta Simmers as Physician Assistant (Urology) Lucilla Lame, MD as Consulting Physician (Gastroenterology) Wellington Hampshire, MD as Consulting Physician (Cardiology)  Review of Systems  HENT: Positive for dental problem, ear pain and hearing loss.   Eyes: Positive  for photophobia, pain, itching and visual disturbance.  Respiratory: Positive for shortness of breath.   Cardiovascular: Positive for chest pain and palpitations.  Gastrointestinal: Positive for constipation.  Endocrine: Positive for polyuria.  Genitourinary: Positive for decreased urine volume, dysuria, enuresis and flank pain.  Musculoskeletal: Positive for gait problem, joint swelling and neck stiffness.  Hematological: Bruises/bleeds easily.  All other systems reviewed and are negative.     Objective    BP (!) 114/52   Pulse 71   Temp 97.6 F (36.4 C) (Oral)   Resp 16   Wt 256 lb 9.6 oz (116.4 kg)   SpO2 96%   BMI 36.82 kg/m  Physical Exam Vitals and nursing note reviewed.  Constitutional:      General: He is not in acute distress.    Appearance: Normal appearance. He is well-developed. He is not ill-appearing, toxic-appearing or diaphoretic.     Comments: Patient is alert and oriented and responsive to questions Engages in eye contact with provider. Speaks in full sentences without any pauses without any shortness of breath or distress.    HENT:     Head: Normocephalic and atraumatic.     Jaw: There is normal jaw occlusion.     Right Ear: Hearing and ear canal normal. Drainage, swelling and tenderness present. A middle ear effusion is present. There is no impacted cerumen. No mastoid tenderness. Tympanic membrane is not erythematous. Tympanic membrane has normal mobility.     Left Ear: Hearing, ear canal and external ear normal. Swelling and tenderness  present. A middle ear effusion is present. There is no impacted cerumen. No mastoid tenderness. Tympanic membrane is not erythematous. Tympanic membrane has normal mobility.     Nose: Nose normal.     Mouth/Throat:     Pharynx: Oropharynx is clear. Uvula midline. No oropharyngeal exudate.  Eyes:     General: Lids are normal. No scleral icterus.       Right eye: No discharge.        Left eye: No discharge.     Conjunctiva/sclera: Conjunctivae normal.     Pupils: Pupils are equal, round, and reactive to light.  Neck:     Thyroid: No thyromegaly.     Vascular: Normal carotid pulses. No carotid bruit, hepatojugular reflux or JVD.     Trachea: Trachea and phonation normal. No tracheal tenderness or tracheal deviation.     Meningeal: Brudzinski's sign absent.  Cardiovascular:     Rate and Rhythm: Normal rate and regular rhythm.     Pulses: Normal pulses.     Heart sounds: Normal heart sounds, S1 normal and S2 normal. Heart sounds not distant. No murmur heard.  No friction rub. No gallop.   Pulmonary:     Effort: Pulmonary effort is normal. No accessory muscle usage or respiratory distress.     Breath sounds: Normal breath sounds. No stridor. No wheezing or rales.  Chest:     Chest wall: No tenderness.  Abdominal:     General: Bowel sounds are normal. There is no distension.     Palpations: Abdomen is soft. There is no mass.     Tenderness: There is no abdominal tenderness. There is no guarding or rebound.     Hernia: No hernia is present.  Musculoskeletal:        General: No tenderness or deformity. Normal range of motion.     Cervical back: Full passive range of motion without pain, normal range of motion and neck supple.  Comments: Patient moves on and off of exam table and in room without difficulty. Gait is normal in hall and in room. Patient is oriented to person place time and situation. Patient answers questions appropriately and engages in conversation.   Lymphadenopathy:      Head:     Right side of head: No submental, submandibular, tonsillar, preauricular, posterior auricular or occipital adenopathy.     Left side of head: No submental, submandibular, tonsillar, preauricular, posterior auricular or occipital adenopathy.     Cervical: No cervical adenopathy.  Skin:    General: Skin is warm and dry.     Capillary Refill: Capillary refill takes less than 2 seconds.     Coloration: Skin is not pale.     Findings: No erythema or rash.     Nails: There is no clubbing.  Neurological:     Mental Status: He is alert and oriented to person, place, and time.     GCS: GCS eye subscore is 4. GCS verbal subscore is 5. GCS motor subscore is 6.     Cranial Nerves: No cranial nerve deficit.     Sensory: No sensory deficit.     Motor: No abnormal muscle tone.     Coordination: Coordination normal.     Gait: Gait normal.     Deep Tendon Reflexes: Reflexes are normal and symmetric. Reflexes normal.  Psychiatric:        Speech: Speech normal.        Behavior: Behavior normal.        Thought Content: Thought content normal.        Judgment: Judgment normal.     Depression Screen PHQ 2/9 Scores 10/16/2020  PHQ - 2 Score 4  PHQ- 9 Score 18   Results for orders placed or performed in visit on 10/16/20  CBC with Differential/Platelet  Result Value Ref Range   WBC 7.3 3.4 - 10.8 x10E3/uL   RBC 5.14 4.14 - 5.80 x10E6/uL   Hemoglobin 15.6 13.0 - 17.7 g/dL   Hematocrit 46.0 37.5 - 51.0 %   MCV 90 79 - 97 fL   MCH 30.4 26.6 - 33.0 pg   MCHC 33.9 31 - 35 g/dL   RDW 12.1 11.6 - 15.4 %   Platelets 208 150 - 450 x10E3/uL   Neutrophils 64 Not Estab. %   Lymphs 25 Not Estab. %   Monocytes 8 Not Estab. %   Eos 2 Not Estab. %   Basos 1 Not Estab. %   Neutrophils Absolute 4.8 1.40 - 7.00 x10E3/uL   Lymphocytes Absolute 1.8 0 - 3 x10E3/uL   Monocytes Absolute 0.6 0 - 0 x10E3/uL   EOS (ABSOLUTE) 0.1 0.0 - 0.4 x10E3/uL   Basophils Absolute 0.0 0 - 0 x10E3/uL   Immature  Granulocytes 0 Not Estab. %   Immature Grans (Abs) 0.0 0.0 - 0.1 x10E3/uL  Comprehensive Metabolic Panel (CMET)  Result Value Ref Range   Glucose 110 (H) 65 - 99 mg/dL   BUN 12 8 - 27 mg/dL   Creatinine, Ser 0.92 0.76 - 1.27 mg/dL   GFR calc non Af Amer 83 >59 mL/min/1.73   GFR calc Af Amer 96 >59 mL/min/1.73   BUN/Creatinine Ratio 13 10 - 24   Sodium 141 134 - 144 mmol/L   Potassium 4.7 3.5 - 5.2 mmol/L   Chloride 102 96 - 106 mmol/L   CO2 25 20 - 29 mmol/L   Calcium 9.7 8.6 - 10.2 mg/dL   Total Protein  7.5 6.0 - 8.5 g/dL   Albumin 4.6 3.7 - 4.7 g/dL   Globulin, Total 2.9 1.5 - 4.5 g/dL   Albumin/Globulin Ratio 1.6 1.2 - 2.2   Bilirubin Total 0.5 0.0 - 1.2 mg/dL   Alkaline Phosphatase 87 44 - 121 IU/L   AST 21 0 - 40 IU/L   ALT 29 0 - 44 IU/L  TSH  Result Value Ref Range   TSH 2.940 0.450 - 4.500 uIU/mL  Lipid Panel w/o Chol/HDL Ratio  Result Value Ref Range   Cholesterol, Total 110 100 - 199 mg/dL   Triglycerides 97 0 - 149 mg/dL   HDL 35 (L) >39 mg/dL   VLDL Cholesterol Cal 19 5 - 40 mg/dL   LDL Chol Calc (NIH) 56 0 - 99 mg/dL  PSA  Result Value Ref Range   Prostate Specific Ag, Serum 7.4 (H) 0.0 - 4.0 ng/mL  POCT urinalysis dipstick  Result Value Ref Range   Color, UA dark yellow    Clarity, UA clear    Glucose, UA Negative Negative   Bilirubin, UA small    Ketones, UA negative    Spec Grav, UA <=1.005 (A) 1.010 - 1.025   Blood, UA negative    pH, UA 7.5 5.0 - 8.0   Protein, UA Negative Negative   Urobilinogen, UA 0.2 0.2 or 1.0 E.U./dL   Nitrite, UA negative    Leukocytes, UA Negative Negative   Appearance     Odor      Assessment & Plan     Screening for blood or protein in urine - Plan: POCT urinalysis dipstick  Acute congestive heart failure, unspecified heart failure type (HCC)  Glaucoma of both eyes, unspecified glaucoma type - Plan: Ambulatory referral to Ophthalmology  Coronary artery disease due to lipid rich plaque  Body mass index (BMI) of  36.0-36.9 in adult  Essential hypertension - Plan: CBC with Differential/Platelet, Comprehensive Metabolic Panel (CMET)  Hyperlipidemia, unspecified hyperlipidemia type - Plan: TSH, Lipid Panel w/o Chol/HDL Ratio  Vision loss - Plan: Ambulatory referral to Ophthalmology  Chronic pain of left knee - Plan: DG Knee Complete 4 Views Left  Elevated PSA - Plan: PSA  Screening for colon cancer - Plan: Ambulatory referral to Gastroenterology  Left hip pain - Plan: DG Hip Unilat W OR W/O Pelvis 2-3 Views Left   Orders Placed This Encounter  Procedures  . DG Knee Complete 4 Views Left    Order Specific Question:   Reason for Exam (SYMPTOM  OR DIAGNOSIS REQUIRED)    Answer:   knee pain chronic.    Order Specific Question:   Preferred imaging location?    Answer:   [facility]  . DG Hip Unilat W OR W/O Pelvis 2-3 Views Left    Order Specific Question:   Reason for Exam (SYMPTOM  OR DIAGNOSIS REQUIRED)    Answer:   left hip pain for years, injury years ago    Order Specific Question:   Preferred imaging location?    Answer:   [facility]  . CBC with Differential/Platelet  . Comprehensive Metabolic Panel (CMET)  . TSH  . Lipid Panel w/o Chol/HDL Ratio  . PSA  . PSA  . Ambulatory referral to Ophthalmology    Referral Priority:   Urgent    Referral Type:   Consultation    Referral Reason:   Specialty Services Required    Requested Specialty:   Ophthalmology    Number of Visits Requested:   1  .  Ambulatory referral to Gastroenterology    Referral Priority:   Routine    Referral Type:   Consultation    Referral Reason:   Specialty Services Required    Number of Visits Requested:   1  . POCT urinalysis dipstick    Meds ordered this encounter  Medications  . furosemide (LASIX) 40 MG tablet    Sig: Take 1 tablet (40 mg total) by mouth daily.    Dispense:  90 tablet    Refill:  0  . apixaban (ELIQUIS) 5 MG TABS tablet    Sig: Take 1 tablet (5 mg total) by mouth 2  (two) times daily.    Dispense:  60 tablet    Refill:  0  . lisinopril-hydrochlorothiazide (ZESTORETIC) 10-12.5 MG tablet    Sig: Take 1 tablet by mouth daily.    Dispense:  90 tablet    Refill:  0  . diltiazem (CARDIZEM CD) 240 MG 24 hr capsule    Sig: Take 1 capsule (240 mg total) by mouth daily.    Dispense:  90 capsule    Refill:  0  . atorvastatin (LIPITOR) 40 MG tablet    Sig: Take 1 tablet (40 mg total) by mouth daily.    Dispense:  30 tablet    Refill:  0  . tamsulosin (FLOMAX) 0.4 MG CAPS capsule    Sig: Take 1 capsule (0.4 mg total) by mouth daily.    Dispense:  90 capsule    Refill:  0  . erythromycin ophthalmic ointment    Sig: Place 1 application into both eyes 4 (four) times daily. ( 0.5 % erythromycin ointment).Apply to affected eye four times daily only thin layer on eyelashes.    Dispense:  3.5 g    Refill:  0  . acetic acid-hydrocortisone (VOSOL-HC) OTIC solution    Sig: Place 3 drops into both ears 3 (three) times daily for 15 days.    Dispense:  6.8 mL    Refill:  1  . neomycin-polymyxin-hydrocortisone (CORTISPORIN) OTIC solution    Sig: Place 3 drops into both ears 3 (three) times daily for 10 days.    Dispense:  4.5 mL    Refill:  1    Red Flags discussed. The patient was given clear instructions to go to ER or return to medical center if any red flags develop, symptoms do not improve, worsen or new problems develop. They verbalized understanding.  Red Flags discussed. The patient was given clear instructions to go to ER or return to medical center if any red flags develop, symptoms do not improve, worsen or new problems develop. They verbalized understanding.  Return in about 3 weeks (around 11/06/2020), or if symptoms worsen or fail to improve, for at any time for any worsening symptoms, Go to Emergency room/ urgent care if worse.       Marcille Buffy, [place] [phone] (phone) [phone] (fax)  [place]

## 2020-10-16 NOTE — Progress Notes (Signed)
Negative left hip( should be duplicate message ) see other message with left knee resulted.

## 2020-10-17 ENCOUNTER — Telehealth: Payer: Self-pay

## 2020-10-17 DIAGNOSIS — M25552 Pain in left hip: Secondary | ICD-10-CM

## 2020-10-17 DIAGNOSIS — M25562 Pain in left knee: Secondary | ICD-10-CM

## 2020-10-17 NOTE — Telephone Encounter (Signed)
-----   Message from Doreen Beam, [place] sent at 10/16/2020  5:09 PM EST ----- Hip x ray is within normal limits- left hip.  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  Ok to place referral to orthopedic of choice.

## 2020-10-17 NOTE — Telephone Encounter (Signed)
Patient has been advised. KW 

## 2020-10-18 LAB — COMPREHENSIVE METABOLIC PANEL
ALT: 29 IU/L (ref 0–44)
AST: 21 IU/L (ref 0–40)
Albumin/Globulin Ratio: 1.6 (ref 1.2–2.2)
Albumin: 4.6 g/dL (ref 3.7–4.7)
Alkaline Phosphatase: 87 IU/L (ref 44–121)
BUN/Creatinine Ratio: 13 (ref 10–24)
BUN: 12 mg/dL (ref 8–27)
Bilirubin Total: 0.5 mg/dL (ref 0.0–1.2)
CO2: 25 mmol/L (ref 20–29)
Calcium: 9.7 mg/dL (ref 8.6–10.2)
Chloride: 102 mmol/L (ref 96–106)
Creatinine, Ser: 0.92 mg/dL (ref 0.76–1.27)
GFR calc Af Amer: 96 mL/min/{1.73_m2} (ref >59)
GFR calc non Af Amer: 83 mL/min/{1.73_m2} (ref >59)
Globulin, Total: 2.9 g/dL (ref 1.5–4.5)
Glucose: 110 mg/dL — ABNORMAL HIGH (ref 65–99)
Potassium: 4.7 mmol/L (ref 3.5–5.2)
Sodium: 141 mmol/L (ref 134–144)
Total Protein: 7.5 g/dL (ref 6.0–8.5)

## 2020-10-18 LAB — CBC WITH DIFFERENTIAL/PLATELET
Basophils Absolute: 0 10*3/uL (ref 0.0–0.2)
Basos: 1 %
EOS (ABSOLUTE): 0.1 10*3/uL (ref 0.0–0.4)
Eos: 2 %
Hematocrit: 46 % (ref 37.5–51.0)
Hemoglobin: 15.6 g/dL (ref 13.0–17.7)
Immature Grans (Abs): 0 10*3/uL (ref 0.0–0.1)
Immature Granulocytes: 0 %
Lymphocytes Absolute: 1.8 10*3/uL (ref 0.7–3.1)
Lymphs: 25 %
MCH: 30.4 pg (ref 26.6–33.0)
MCHC: 33.9 g/dL (ref 31.5–35.7)
MCV: 90 fL (ref 79–97)
Monocytes Absolute: 0.6 10*3/uL (ref 0.1–0.9)
Monocytes: 8 %
Neutrophils Absolute: 4.8 10*3/uL (ref 1.4–7.0)
Neutrophils: 64 %
Platelets: 208 10*3/uL (ref 150–450)
RBC: 5.14 x10E6/uL (ref 4.14–5.80)
RDW: 12.1 % (ref 11.6–15.4)
WBC: 7.3 10*3/uL (ref 3.4–10.8)

## 2020-10-18 LAB — LIPID PANEL W/O CHOL/HDL RATIO
Cholesterol, Total: 110 mg/dL (ref 100–199)
HDL: 35 mg/dL — ABNORMAL LOW (ref >39)
LDL Chol Calc (NIH): 56 mg/dL (ref 0–99)
Triglycerides: 97 mg/dL (ref 0–149)
VLDL Cholesterol Cal: 19 mg/dL (ref 5–40)

## 2020-10-18 LAB — PSA: Prostate Specific Ag, Serum: 7.4 ng/mL — ABNORMAL HIGH (ref 0.0–4.0)

## 2020-10-18 LAB — TSH: TSH: 2.94 u[IU]/mL (ref 0.450–4.500)

## 2020-10-19 ENCOUNTER — Encounter: Payer: Self-pay | Admitting: Adult Health

## 2020-10-19 NOTE — Progress Notes (Signed)
Please add on Hemoglobin  a1c for elevated glucose.  CBC within normal limits.  CMP glucose, within normal limits kidney, liver function and electrolute  TSH is within normal limits for thyroid.   Cholesterol is within normal limits except HDL - your good cholesterol is low, recommend dietary changes and increased exercise.   PSA is elevated for prostate, slightly higher than 5 years ago. Will need referral to urology for this ok to place if patient in agreement to location of choice.

## 2020-10-21 NOTE — Telephone Encounter (Addendum)
Pt daughter Marland Kitchen is calling and would like her dad hip xray results and to report her dad is unable to go to work and would like something for the pain. Pt is unable stand up or walking properly without severe pain [facility]

## 2020-10-22 ENCOUNTER — Other Ambulatory Visit: Payer: Self-pay | Admitting: Adult Health

## 2020-10-22 ENCOUNTER — Telehealth: Payer: Self-pay | Admitting: Adult Health

## 2020-10-22 DIAGNOSIS — I10 Essential (primary) hypertension: Secondary | ICD-10-CM

## 2020-10-22 DIAGNOSIS — M25552 Pain in left hip: Secondary | ICD-10-CM

## 2020-10-22 LAB — SPECIMEN STATUS REPORT

## 2020-10-22 LAB — HEMOGLOBIN A1C
Est. average glucose Bld gHb Est-mCnc: 137 mg/dL
Hgb A1c MFr Bld: 6.4 % — ABNORMAL HIGH (ref 4.8–5.6)

## 2020-10-22 MED ORDER — TRAMADOL HCL 50 MG PO TABS
25.0000 mg | ORAL_TABLET | Freq: Two times a day (BID) | ORAL | 0 refills | Status: DC | PRN
Start: 2020-10-22 — End: 2024-04-02

## 2020-10-22 NOTE — Progress Notes (Signed)
Prediabetic, needs follow up to discuss dietary changes, ok to refer to nutrition as well. Will need office visit for meter to monitor at home.  See attempted telephone call as well.

## 2020-10-22 NOTE — Telephone Encounter (Signed)
Provider tried to call patient and daughter. 10/22/2020.   Orders Placed This Encounter  Procedures  . DG Lumbar Spine Complete  . DG Hip Unilat W OR W/O Pelvis 2-3 Views Right  . DG Chest 2 View    Advise patient x rays above orederd.He is overdue for heart failure clinic appointment asn should call that office to reschedule as well.   See previous note - advise walk in [facility] for knee pain if worsening.

## 2020-10-22 NOTE — Progress Notes (Signed)
Meds ordered this encounter  Medications  . traMADol (ULTRAM) 50 MG tablet    Sig: Take 0.5-1 tablets (25-50 mg total) by mouth every 12 (twelve) hours as needed for moderate pain.    Dispense:  30 tablet    Refill:  0  sent for knee pain, see orthopedics walk in at [facility] if worsening or severe. Additional imaging ordered and will need to take caution will cause drowsiness, do not drive or make legal decisions.

## 2020-10-22 NOTE — Telephone Encounter (Signed)
Nunzio Cory, If he is having that much discomfort please have them go to [facility] walk in today 1pm to 7 pm in [place].   Thank you,  Laverna Peace MSN, AGNP-C, FNP-C  Family Nurse Practitioner  Adult Geriatric Nurse Practitioner

## 2020-10-22 NOTE — Telephone Encounter (Signed)
Left message for daughter to call back, Okay for [facility] to inform daughter of hip xray results. KW

## 2020-10-22 NOTE — Telephone Encounter (Signed)
Left message for daughter to call me back, okay for PEC to inform daughter of hip xray results

## 2020-10-23 ENCOUNTER — Ambulatory Visit (INDEPENDENT_AMBULATORY_CARE_PROVIDER_SITE_OTHER): Payer: Self-pay | Admitting: Adult Health

## 2020-10-23 ENCOUNTER — Telehealth: Payer: Self-pay | Admitting: *Deleted

## 2020-10-23 DIAGNOSIS — Z5329 Procedure and treatment not carried out because of patient's decision for other reasons: Secondary | ICD-10-CM

## 2020-10-23 DIAGNOSIS — Z91199 Patient's noncompliance with other medical treatment and regimen due to unspecified reason: Secondary | ICD-10-CM | POA: Insufficient documentation

## 2020-10-23 NOTE — Progress Notes (Signed)
    No show for follow up appointment. No provider contact.  [place]

## 2020-10-24 ENCOUNTER — Telehealth: Payer: Self-pay

## 2020-10-24 NOTE — Telephone Encounter (Signed)
CCM will be great for social worker, pharm d and nurse case Freight forward[employer].

## 2020-10-24 NOTE — Telephone Encounter (Signed)
Copied from [place] [id number]. Topic: General - Other >> Oct 24, 2020 10:15 AM Keene Breath wrote: Reason for CRM: Patient's daughter (Ossiris), called to ask the nurse or doctor to call to discuss getting some transportation help for the patient so he will not miss his appts. Like he did yesterday.  She stated that sometimes the patient gets confused and she would like to discuss this with the doctor.  CB# [phone]

## 2020-10-24 NOTE — Telephone Encounter (Signed)
Lmtcb, would you like me to place referral for CCM? I know Medicaid/Medicare does offer transportation to get to appointments to medical practice. Also I know if they contact [employer] Social [employer] they can also get application to fill out for medical transportation. KW

## 2020-10-27 NOTE — Telephone Encounter (Signed)
No show per chart.

## 2020-10-29 ENCOUNTER — Telehealth: Payer: Medicare Other

## 2020-10-29 ENCOUNTER — Telehealth: Payer: Self-pay

## 2020-10-29 NOTE — Telephone Encounter (Signed)
Unable to contact patient or his daughter to complete 9am colonoscopy triage and scheduling.  Unable to LVM due to not having a voicemail.  Thanks,  [place], [place]

## 2020-11-04 NOTE — Telephone Encounter (Signed)
Noted PEC please relay message to patient or daughter on DPR.

## 2020-11-06 ENCOUNTER — Ambulatory Visit: Payer: Medicare Other | Admitting: Adult Health

## 2020-11-06 ENCOUNTER — Ambulatory Visit: Payer: Self-pay | Admitting: Adult Health

## 2020-11-10 ENCOUNTER — Telehealth: Payer: Self-pay

## 2020-11-10 ENCOUNTER — Ambulatory Visit (INDEPENDENT_AMBULATORY_CARE_PROVIDER_SITE_OTHER): Payer: Medicare Other | Admitting: Adult Health

## 2020-11-10 ENCOUNTER — Encounter: Payer: Self-pay | Admitting: Adult Health

## 2020-11-10 ENCOUNTER — Other Ambulatory Visit: Payer: Self-pay

## 2020-11-10 VITALS — BP 120/84 | HR 73 | Temp 98.1°F | Resp 16 | Wt 253.8 lb

## 2020-11-10 DIAGNOSIS — H6090 Unspecified otitis externa, unspecified ear: Secondary | ICD-10-CM | POA: Diagnosis not present

## 2020-11-10 DIAGNOSIS — H44001 Unspecified purulent endophthalmitis, right eye: Secondary | ICD-10-CM

## 2020-11-10 MED ORDER — AZITHROMYCIN 1 % OP SOLN: 1.0000 [drp] | Freq: Every day | OPHTHALMIC | 0 refills | Status: AC

## 2020-11-10 MED ORDER — HYDROCORTISONE-ACETIC ACID 1-2 % OT SOLN: 3.0000 [drp] | Freq: Three times a day (TID) | OTIC | 1 refills | Status: AC

## 2020-11-10 NOTE — Telephone Encounter (Signed)
Patient coming into office at 11:20 will disucss. KW

## 2020-11-10 NOTE — Telephone Encounter (Signed)
Patients daughter was advised of prescription eye ointment that was prescribed. KW

## 2020-11-10 NOTE — Patient Instructions (Signed)
Preventing Type 2 Diabetes Mellitus Type 2 diabetes (type 2 diabetes mellitus) is a long-term (chronic) disease that affects blood sugar (glucose) levels. Normally, a hormone called insulin allows glucose to enter cells in the body. The cells use glucose for energy. In type 2 diabetes, one or both of these problems may be present:  The body does not make enough insulin.  The body does not respond properly to insulin that it makes (insulin resistance). Insulin resistance or lack of insulin causes excess glucose to build up in the blood instead of going into cells. As a result, high blood glucose (hyperglycemia) develops, which can cause many complications. Being overweight or obese and having an inactive (sedentary) lifestyle can increase your risk for diabetes. Type 2 diabetes can be delayed or prevented by making certain nutrition and lifestyle changes. What nutrition changes can be made?   Eat healthy meals and snacks regularly. Keep a healthy snack with you for when you get hungry between meals, such as fruit or a handful of nuts.  Eat lean meats and proteins that are low in saturated fats, such as chicken, fish, egg whites, and beans. Avoid processed meats.  Eat plenty of fruits and vegetables and plenty of grains that have not been processed (whole grains). It is recommended that you eat: ? 1?2 cups of fruit every day. ? 2?3 cups of vegetables every day. ? 6?8 oz of whole grains every day, such as oats, whole wheat, bulgur, brown rice, quinoa, and millet.  Eat low-fat dairy products, such as milk, yogurt, and cheese.  Eat foods that contain healthy fats, such as nuts, avocado, olive oil, and canola oil.  Drink water throughout the day. Avoid drinks that contain added sugar, such as soda or sweet tea.  Follow instructions from your health care provider about specific eating or drinking restrictions.  Control how much food you eat at a time (portion size). ? Check food labels to find  out the serving sizes of foods. ? Use a kitchen scale to weigh amounts of foods.  Saute or steam food instead of frying it. Cook with water or broth instead of oils or butter.  Limit your intake of: ? Salt (sodium). Have no more than 1 tsp (2,400 mg) of sodium a day. If you have heart disease or high blood pressure, have less than ? tsp (1,500 mg) of sodium a day. ? Saturated fat. This is fat that is solid at room temperature, such as butter or fat on meat. What lifestyle changes can be made? Activity   Do moderate-intensity physical activity for at least 30 minutes on at least 5 days of the week, or as much as told by your health care provider.  Ask your health care provider what activities are safe for you. A mix of physical activities may be best, such as walking, swimming, cycling, and strength training.  Try to add physical activity into your day. For example: ? Park in spots that are farther away than usual, so that you walk more. For example, park in a far corner of the parking lot when you go to the office or the grocery store. ? Take a walk during your lunch break. ? Use stairs instead of elevators or escalators. Weight Loss  Lose weight as directed. Your health care provider can determine how much weight loss is best for you and can help you lose weight safely.  If you are overweight or obese, you may be instructed to lose at least 5?7 %  of your body weight. Alcohol and Tobacco   Limit alcohol intake to no more than 1 drink a day for nonpregnant women and 2 drinks a day for men. One drink equals 12 oz of beer, 5 oz of wine, or 1 oz of hard liquor.  Do not use any tobacco products, such as cigarettes, chewing tobacco, and e-cigarettes. If you need help quitting, ask your health care provider. Work With Your Health Care Provider  Have your blood glucose tested regularly, as told by your health care provider.  Discuss your risk factors and how you can reduce your risk for  diabetes.  Get screening tests as told by your health care provider. You may have screening tests regularly, especially if you have certain risk factors for type 2 diabetes.  Make an appointment with a diet and nutrition specialist (registered dietitian). A registered dietitian can help you make a healthy eating plan and can help you understand portion sizes and food labels. Why are these changes important?  It is possible to prevent or delay type 2 diabetes and related health problems by making lifestyle and nutrition changes.  It can be difficult to recognize signs of type 2 diabetes. The best way to avoid possible damage to your body is to take actions to prevent the disease before you develop symptoms. What can happen if changes are not made?  Your blood glucose levels may keep increasing. Having high blood glucose for a long time is dangerous. Too much glucose in your blood can damage your blood vessels, heart, kidneys, nerves, and eyes.  You may develop prediabetes or type 2 diabetes. Type 2 diabetes can lead to many chronic health problems and complications, such as: ? Heart disease. ? Stroke. ? Blindness. ? Kidney disease. ? Depression. ? Poor circulation in the feet and legs, which could lead to surgical removal (amputation) in severe cases. Where to find support  Ask your health care provider to recommend a registered dietitian, diabetes educator, or weight loss program.  Look for local or online weight loss groups.  Join a gym, fitness club, or outdoor activity group, such as a walking club. Where to find more information To learn more about diabetes and diabetes prevention, visit:  American Diabetes Association (ADA): www.diabetes.[employer] of Diabetes and Digestive and Kidney Diseases: ToyArticles.ca To learn more about healthy eating, visit:  The U.S. Department of Agriculture Architect), Choose My Plate:  http://yates.biz/  Office of Disease Prevention and Health Promotion (ODPHP), Dietary Guidelines: ListingMagazine.si Summary  You can reduce your risk for type 2 diabetes by increasing your physical activity, eating healthy foods, and losing weight as directed.  Talk with your health care provider about your risk for type 2 diabetes. Ask about any blood tests or screening tests that you need to have. This information is not intended to replace advice given to you by your health care provider. Make sure you discuss any questions you have with your health care provider. Document Revised: 02/23/2019 Document Reviewed: 12/23/2015 Elsevier Patient Education  2020 [employer]. Plan de alimentacin para personas con prediabetes Prediabetes Eating Plan La prediabetes es una afeccin que hace que los niveles de azcar en la sangre (glucosa) sean ms altos de lo normal. Esto aumenta el riesgo de tener diabetes. Para prevenir la diabetes, es posible que su mdico le recomiende cambios en la dieta y otros cambios en su estilo de vida que lo ayuden a Personnel officer lo siguiente:  Chief Operating Officer los niveles de glucemia.  Mejorar los  niveles de colesterol.  Controlar la presin arterial. El mdico puede recomendarle que trabaje con un especialista en alimentacin y nutricin (nutricionista) para Materials engineer de comidas ms conveniente para usted. Consejos para seguir este plan: Estilo de vida  Establezca metas para bajar de peso con la ayuda de su equipo de atencin mdica. A la [employer] con prediabetes se les recomienda bajar un 7% de su peso corporal.  Haga ejercicio al menos 5das por semana, como mnimo.  Asista a un grupo de apoyo o solicite el apoyo continuo de un consejero de salud mental.  [employer] medicamentos de venta libre y los recetados solamente como se lo haya indicado el mdico. Leer las etiquetas de los alimentos  Lea las etiquetas  de los alimentos envasados para controlar la cantidad de grasa, sal (sodio) y azcar que contienen. Evite los alimentos que contengan lo siguiente: ? Grasas saturadas. ? Grasas trans. ? Azcares agregados.  Evite los alimentos que contengan ms de 381miligramos(mg) de sodio por porcin. Limite el consumo diario de sodio a menos de  por Futures trader. De compras  Evite comprar alimentos procesados y preelaborados. Coccin  Cocine con aceite de oliva. No use mantequilla, manteca de cerdo o [employer].  Cocine los alimentos al horno, a la parrilla, asados o hervidos. Evite frerlos. Planificacin de las comidas   Trabaje con el nutricionista para crear un plan de alimentacin que sea adecuado para usted. Esto puede incluir lo siguiente: ? Registro de la cantidad de caloras que ingiere. Use un registro de alimentos, un cuaderno o una aplicacin mvil para anotar lo que comi en cada comida. ? Uso del ndice glucmico (IG) para planificar las comidas. El ndice [place] con qu rapidez elevar la glucemia un alimento. Elija alimentos con bajo IG. Estos demoran ms en elevar la glucemia.  Considere la posibilidad de seguir Clinical cytogeneticist. Esta dieta incluye lo siguiente: ? Varias porciones de frutas y verduras frescas por da. ? Pescado al [employer] veces por semana. ? Varias porciones de cereales integrales, frijoles, frutos secos y semillas por da. ? Aceite de Location manager de otras grasas. ? Consumo moderado de alcohol. ? Pequeas cantidades de carnes rojas y lcteos enteros.  Si tiene hipertensin arterial, quizs Secretary/administrator consumo de sodio o seguir una dieta como el plan de alimentacin basado en Enfoques Alimentarios para Detener la Hipertensin (Dietary Approaches to Stop Hypertension, DASH). Este es un plan de alimentacin cuyo objetivo es bajar la hipertensin arterial. Qu alimentos se recomiendan? Es posible que los alimentos incluidos a continuacin  no Engineer, drilling. Hable con el nutricionista sobre las mejores opciones alimenticias para usted. Cereales Productos integrales, como panes, galletas, cereales y pastas de salvado o integrales. Avena sin azcar. Trigo burgol. Cebada. Quinua. Arroz integral. Tacos o tortillas de harina de maz o de salvado. Hoover Brunette Deatra James. Espinaca. Guisantes. Remolachas. Coliflor. Repollo. Brcoli. Zanahorias. Tomates. [place]aza. Christella Noa. Hierbas. Pimienta. Cebollas. Pepinos. Repollitos de Bruselas. Frutas Frutos rojos. Bananas. Manzanas. Naranjas. Uvas. Papaya. Mango. [place]. Kiwi. Pomelo. Cerezas. Carnes y otros alimentos ricos en protenas Mariscos. Carne de ave sin piel. Cortes magros de cerdo y carne de res. Tofu. Huevos. Frutos secos. Frijoles. Lcteos Productos lcteos descremados o semidescremados, como yogur, queso cottage y [place]. [employer] T. Caf. Gaseosas sin azcar o dietticas. Soda. Leche descremada o semidescremada. Productos alternativos a la [place], como [place] de soja o de [place]. Grasas y aceites Aceite de Artas. Aceite de canola. Aceite de girasol.  Aceite de semillas de uva. Aguacate. Nueces. Dulces y postres Pudin sin azcar o con bajo contenido de [place]. Helado y otros postres congelados sin azcar o con bajo contenido de [place]. Condimentos y otros alimentos Hierbas. Especias sin sodio. Mostaza. Salsa de pepinillos. [place] con bajo contenido de [place] y de International aid/development worker. Salsa barbacoa con bajo contenido de grasa y de azcar. Mayonesa con bajo contenido de grasa o sin grasa. Qu alimentos no se recomiendan? Es posible que los alimentos incluidos a continuacin no Engineer, drilling. Hable con el nutricionista sobre las mejores opciones alimenticias para usted. Cereales Productos elaborados con [place] y [place], como panes, pastas, bocadillos y cereales. Verduras Verduras enlatadas. Verduras congeladas con mantequilla o salsa de  crema. Nils Pyle Frutas enlatadas al almbar. Carnes y otros alimentos ricos en protenas Cortes de carne con grasa. Carne de ave con piel. Carne empanizada o frita. Carnes procesadas. Lcteos Yogur, queso o [employer]. Bebidas Bebidas azucaradas, como t helado dulce y [place]. Grasas y [employer]. Manteca de cerdo. Mantequilla clarificada. Dulces y [employer], como pasteles, pastelitos, galletas dulces y tarta de [place]. Condimentos y otros alimentos Mezclas de especias con sal agregada. [place]. Salsa barbacoa. Mayonesa. Resumen  Para prevenir la diabetes, es posible que Personnel officer en la dieta y otros cambios en su estilo de vida para ayudar a Pharmacologist en la sangre, mejorar los niveles de colesterol y Scientist, physiological presin arterial.  Establezca metas para bajar de peso con la ayuda de su equipo de atencin mdica. A la [employer] con prediabetes se les recomienda bajar un 7por ciento de su peso corporal.  Considere la posibilidad de seguir una dieta mediterrnea que incluya muchas frutas y verduras frescas, cereales integrales, frijoles, frutos secos, semillas, pescado, carnes magras, lcteos descremados y aceites saludables. Esta informacin no tiene Theme park manager el consejo del mdico. Asegrese de hacerle al mdico cualquier pregunta que tenga. Document Revised: 05/16/2017 Document Reviewed: 05/16/2017 Elsevier Patient Education  2020 Elsevier Inc.  Prediabetes Eating Plan Prediabetes is a condition that causes blood sugar (glucose) levels to be higher than normal. This increases the risk for developing diabetes. In order to prevent diabetes from developing, your health care provider may recommend a diet and other lifestyle changes to help you:  Control your blood glucose levels.  Improve your cholesterol levels.  Manage your blood pressure. Your health care provider may recommend working with a diet and  nutrition specialist (dietitian) to make a meal plan that is best for you. What are tips for following this plan? Lifestyle  Set weight loss goals with the help of your health care team. It is recommended that most people with prediabetes lose 7% of their current body weight.  Exercise for at least 30 minutes at least 5 days a week.  Attend a support group or seek ongoing support from a mental health counselor.  Take over-the-counter and prescription medicines only as told by your health care provider. Reading food labels  Read food labels to check the amount of fat, salt (sodium), and sugar in prepackaged foods. Avoid foods that have: ? Saturated fats. ? Trans fats. ? Added sugars.  Avoid foods that have more than 300 milligrams (mg) of sodium per serving. Limit your daily sodium intake to less than 2,300 mg each day. Shopping  Avoid buying pre-made and processed foods. Cooking  Cook with olive oil. Do not use butter, lard, or ghee.  Bake, broil, grill, or boil  foods. Avoid frying. Meal planning   Work with your dietitian to develop an eating plan that is right for you. This may include: ? Tracking how many calories you take in. Use a food diary, notebook, or mobile application to track what you eat at each meal. ? Using the glycemic index (GI) to plan your meals. The index tells you how quickly a food will raise your blood glucose. Choose low-GI foods. These foods take a longer time to raise blood glucose.  Consider following a Mediterranean diet. This diet includes: ? Several servings each day of fresh fruits and vegetables. ? Eating fish at least twice a week. ? Several servings each day of whole grains, beans, nuts, and seeds. ? Using olive oil instead of other fats. ? Moderate alcohol consumption. ? Eating small amounts of red meat and whole-fat dairy.  If you have high blood pressure, you may need to limit your sodium intake or follow a diet such as the DASH eating  plan. DASH is an eating plan that aims to lower high blood pressure. What foods are recommended? The items listed below may not be a complete list. Talk with your dietitian about what dietary choices are best for you. Grains Whole grains, such as whole-wheat or whole-grain breads, crackers, cereals, and pasta. Unsweetened oatmeal. Bulgur. Barley. Quinoa. Brown rice. Corn or whole-wheat flour tortillas or taco shells. Vegetables Lettuce. Spinach. Peas. Beets. Cauliflower. Cabbage. Broccoli. Carrots. Tomatoes. Squash. Eggplant. Herbs. Peppers. Onions. Cucumbers. Brussels sprouts. Fruits Berries. Bananas. Apples. Oranges. Grapes. Papaya. Mango. Pomegranate. Kiwi. Grapefruit. Cherries. Meats and other protein foods Seafood. Poultry without skin. Lean cuts of pork and beef. Tofu. Eggs. Nuts. Beans. Dairy Low-fat or fat-free dairy products, such as yogurt, cottage cheese, and cheese. Beverages Water. Tea. Coffee. Sugar-free or diet soda. Seltzer water. Lowfat or no-fat milk. Milk alternatives, such as soy or almond milk. Fats and oils Olive oil. Canola oil. Sunflower oil. Grapeseed oil. Avocado. Walnuts. Sweets and desserts Sugar-free or low-fat pudding. Sugar-free or low-fat ice cream and other frozen treats. Seasoning and other foods Herbs. Sodium-free spices. Mustard. Relish. Low-fat, low-sugar ketchup. Low-fat, low-sugar barbecue sauce. Low-fat or fat-free mayonnaise. What foods are not recommended? The items listed below may not be a complete list. Talk with your dietitian about what dietary choices are best for you. Grains Refined white flour and flour products, such as bread, pasta, snack foods, and cereals. Vegetables Canned vegetables. Frozen vegetables with butter or cream sauce. Fruits Fruits canned with syrup. Meats and other protein foods Fatty cuts of meat. Poultry with skin. Breaded or fried meat. Processed meats. Dairy Full-fat yogurt, cheese, or milk. Beverages Sweetened  drinks, such as sweet iced tea and soda. Fats and oils Butter. Lard. Ghee. Sweets and desserts Baked goods, such as cake, cupcakes, pastries, cookies, and cheesecake. Seasoning and other foods Spice mixes with added salt. Ketchup. Barbecue sauce. Mayonnaise. Summary  To prevent diabetes from developing, you may need to make diet and other lifestyle changes to help control blood sugar, improve cholesterol levels, and manage your blood pressure.  Set weight loss goals with the help of your health care team. It is recommended that most people with prediabetes lose 7 percent of their current body weight.  Consider following a Mediterranean diet that includes plenty of fresh fruits and vegetables, whole grains, beans, nuts, seeds, fish, lean meat, low-fat dairy, and healthy oils. This information is not intended to replace advice given to you by your health care provider. Make sure you discuss  any questions you have with your health care provider. Document Revised: 02/23/2019 Document Reviewed: 01/05/2017 Elsevier Patient Education  2020 Elsevier Inc. Acetic Acid ear solution Qu es este medicamento? Las gotas ticas de CIDO ACTICO se usan para tratar infecciones del odo externo, tales como el "odo de nadador". Este medicamento puede ser utilizado para otros usos; si tiene alguna pregunta consulte con su proveedor de atencin mdica o con su farmacutico. MARCAS COMUNES: Acetasol, Borofair, VoSoL Armed forces operational officer a mi profesional de la salud antes de tomar este medicamento? Necesita saber si usted presenta alguno de los siguientes problemas o situaciones: ruptura del tmpano una reaccin alrgica o inusual al cido actico, al glicol propilnico, a otros medicamentos, alimentos, colorantes o conservantes si est embarazada o buscando quedar embarazada si est amamantando a un beb Cmo debo [employer]? Este medicamento es para utilizarse [employer] odos. Siga  las instrucciones de la etiqueta del [place]. Lvese las manos con agua y [place]. Primero, limpie cuidadosamente el odo o los odos con un hisopo de algodn seco. Entibie suavemente el frasco sostenindolo en la mano durante 1 a 2 minutos. Use segn las instrucciones de su mdico o profesional de [employer]. Acustese de costado con el odo afectado [place]. Trate que la punta del gotero no toque el odo, las yemas de los dedos o cualquier otra superficie. Apriete el frasco suavemente para colocar la cantidad indicada de gotas en el canal auditivo. Qudese en esta posicin durante 30 a 60 segundos, y levantado durante 5 minutos, para ayudar a que las gotas penetren en el odo. Repita, de ser necesario, para el otro odo. No utilice el medicamento con una frecuencia mayor a la indicada. Complete todo el tratamiento con el medicamento segn lo haya recetado su profesional de la salud, aun si considera que su problema ha mejorado. Hable con su pediatra para informarse acerca del uso de este medicamento en nios. Aunque este medicamento se puede recetar a nios tan pequeos como de 3 aos de edad o Copy en casos selectos, existen precauciones que deben tomarse. Sobredosis: Pngase en contacto inmediatamente con un centro toxicolgico o una sala de urgencia si usted cree que haya tomado demasiado medicamento. ATENCIN: [employer] es solo para usted. No comparta este medicamento con nadie. Qu sucede si me olvido de una dosis? Si se olvida una dosis, aplquela lo antes posible. Si es casi la hora de la prxima dosis, aplique slo esa dosis. No use dosis adicionales o dobles. Qu puede interactuar con este medicamento? No se esperan interacciones. No utilice otros productos para los odos sin consultar a su mdico o a su profesional de Radiographer, therapeutic. Puede ser que esta lista no menciona todas las posibles interacciones. Informe a su profesional de [employer] de [employer] productos a base de hierbas,  medicamentos de [place] o suplementos nutritivos que est tomando. Si usted fuma, consume bebidas alcohlicas o si utiliza drogas ilegales, indqueselo tambin a su profesional de [employer]. Algunas sustancias pueden interactuar con su medicamento. A qu debo estar atento al usar [employer]? Si la infeccin en los odos no mejora a los [address], informe a su mdico o a su profesional de [employer]. Si se produce una reaccin alrgica o erupcin, dejar de utilizar este producto inmediatamente y pngase en contacto con su mdico o profesional de Radiographer, therapeutic. Es importante que mantenga el odo infectado limpio y seco. Trate de no mojar el odo infectado al baarse. No nade  a menos que su mdico o su profesional de Community education officer indique lo contrario. Para evitar la propagacin de la infeccin, no comparta productos para los odos, toallas de bao ni toallas para manos con ninguna Engineer, maintenance (IT). Qu efectos secundarios puedo tener al [employer] este medicamento? Efectos secundarios que debe informar a su mdico o a Producer, television/film/video de la salud tan pronto como sea posible: Therapist, art, como erupcin cutnea, picazn o urticarias, e hinchazn de la cara, los labios o la lengua ardor y Environmental manager de odo que empeora Efectos secundarios que generalmente no requieren atencin mdica (infrmelos a su mdico o a Producer, television/film/video de la salud si persisten o si son molestos): sensacin desagradable cuando se colocan las gotas en el odo Puede ser que esta lista no menciona todos los posibles efectos secundarios. Comunquese a su mdico por asesoramiento mdico [employer]. Usted puede informar los efectos secundarios a la FDA por telfono al 1-800-FDA-1088. Dnde debo guardar mi medicina? Mantngala fuera del alcance de los nios. Gurdela a [employer], entre 15 y 30 grados C (5 y 58 grados F). Deseche todo el medicamento que no haya utilizado, despus de la fecha de  vencimiento. ATENCIN: Este folleto es un resumen. Puede ser que no cubra toda la posible informacin. Si usted tiene preguntas acerca de esta medicina, consulte con su mdico, su farmacutico o su profesional de Radiographer, therapeutic.  2020 Elsevier/Gold Standard (2016-12-02 00:00:00) Acetic Acid ear solution What is this medicine? ACETIC ACID (a SEE tik AS id) ear drops are used to treat external ear infections, such as "swimmer's ear". This medicine may be used for other purposes; ask your health care provider or pharmacist if you have questions. COMMON BRAND NAME(S): Acetasol, Borofair, VoSoL What should I tell my health care provider before I take this medicine? They need to know if you have any of these conditions:  ruptured ear drum  an unusual or allergic reaction to acetic acid, propylene glycol, other medicines, foods, dyes, or preservatives  pregnant or trying to get pregnant  breast-feeding How should I use this medicine? This medicine is only for use in your ears. Follow the directions on the prescription label. Wash your hands with soap and water. First, carefully clean your ear(s) with a dry cotton swab. Gently warm the bottle by holding it in the hand for 1 to 2 minutes. Use as directed by your doctor or health care professional. Lorenz Coaster down on your side with the affected ear up. Try not to touch the tip of the dropper to your ear, fingertips, or other surface. Squeeze the bottle gently to place the prescribed number of drops in the ear canal. Stay in this position for 30 to 60 seconds, and up to 5 minutes, to help the drops soak into the ear. Repeat, if necessary, for the opposite ear. Do not use your medicine more often than directed. Finish the full course of medicine prescribed by your health care professional even if you think your condition is better. Talk to your pediatrician regarding the use of this medicine in children. While this drug may be prescribed for children as young as 3 years  of age and older for selected conditions, precautions do apply. Overdosage: If you think you have taken too much of this medicine contact a poison control center or emergency room at once. NOTE: This medicine is only for you. Do not share this medicine with others. What if I miss a dose? If you miss a  dose, use it as soon as you can. If it is almost time for your next dose, use only that dose. Do not use double or extra doses. What may interact with this medicine? Interactions are not expected. Do not use other ear products without talking to your doctor or health care professional. This list may not describe all possible interactions. Give your health care provider a list of all the medicines, herbs, non-prescription drugs, or dietary supplements you use. Also tell them if you smoke, drink alcohol, or use illegal drugs. Some items may interact with your medicine. What should I watch for while using this medicine? Tell your doctor or health care professional if your ear infection does not get better in a few days. If a rash or allergic reaction occurs, stop using this product right away and contact your doctor or health care professional. It is important that you keep the infected ear(s) clean and dry. When bathing, try not to get the infected ear(s) wet. Do not go swimming unless your doctor or health care professional has told you otherwise. To prevent the spread of infection, do not share ear products or share towels and washcloths with anyone else. What side effects may I notice from receiving this medicine? Side effects that you should report to your doctor or health care professional as soon as possible:  allergic reactions like skin rash, itching or hives, swelling of the face, lips, or tongue  burning and redness  worsening ear pain Side effects that usually do not require medical attention (report to your doctor or health care professional if they continue or are  bothersome):  unpleasant feeling while putting the drops in the ear This list may not describe all possible side effects. Call your doctor for medical advice about side effects. You may report side effects to FDA at 1-800-FDA-1088. Where should I keep my medicine? Keep out of the reach of children. Store at room temperature between 15 and 30 degrees C (59 and 86 degrees F). Throw away any unused medicine after the expiration date. NOTE: This sheet is a summary. It may not cover all possible information. If you have questions about this medicine, talk to your doctor, pharmacist, or health care provider.  2020 Elsevier/Gold Standard (2015-12-04 08:33:14) Azithromycin eye solution Qu es este medicamento? La AZITROMICINA es un antibitico macrlido. Se utiliza en el tratamiento de infecciones bacterianas de los ojos. Este medicamento puede ser utilizado para otros usos; si tiene alguna pregunta consulte con su proveedor de atencin mdica o con su farmacutico. MARCAS COMUNES: Azasite Qu le debo informar a mi profesional de la salud antes de tomar este medicamento? Necesita saber si usted presenta alguno de los siguientes problemas o situaciones:  si [place] lentes de contacto  una reaccin alrgica o inusual a la azitromicina, a la eritromicina, a otros medicamentos, alimentos, colorantes o conservantes  si est embarazada o buscando quedar embarazada  si est amamantando a un beb Cmo debo utilizar este medicamento? Este medicamento es para Chemical engineer [employer] ojos. Siga las instrucciones de la etiqueta del [place]. Lvese las manos antes y despus de usarlo. Trate que la punta del gotero no toque ninguna superficie, incluyendo el ojo. Incline la cabeza levemente hacia atrs y lleve el prpado inferior hacia abajo con su dedo ndice para formar [employer]. Aplique la cantidad de gotas recetada dentro de la bolsa. Cierre el ojo suavemente para que se esparzan las gotas. Se le puede  nublar la vista por algunos minutos. [employer]  sus dosis a intervalos regulares. No utilice sus medicamentos con una frecuencia mayor que la indicada. Complete todo el tratamiento con el medicamento como recetado, aun si considera que su problema ha mejorado. No omita ninguna dosis o suspenda el uso de su medicamento antes de lo indicado. Hable con su pediatra para informarse acerca del uso de este medicamento en nios. Aunque este medicamento ha sido recetado a nios tan menores como de 1 ao de edad para condiciones selectivas, las precauciones se aplican. Sobredosis: Pngase en contacto inmediatamente con un centro toxicolgico o una sala de urgencia si usted cree que haya tomado demasiado medicamento. ATENCIN: [employer] es solo para usted. No comparta este medicamento con nadie. Qu sucede si me olvido de una dosis? Si olvida una dosis, aplquela lo antes posible. Si es casi la hora de la prxima dosis, aplique slo esa dosis. No use dosis adicionales o dobles. Qu puede interactuar con este medicamento? No se esperan interacciones. No use otros productos para los ojos sin antes consultar con su mdico o su profesional de Radiographer, therapeutic. Puede ser que esta lista no menciona todas las posibles interacciones. Informe a su profesional de [employer] de [employer] productos a base de hierbas, medicamentos de [employer] o suplementos nutritivos que est tomando. Si usted fuma, consume bebidas alcohlicas o si utiliza drogas ilegales, indqueselo tambin a su profesional de [employer]. Algunas sustancias pueden interactuar con su medicamento. A qu debo estar atento al usar [employer]? Si los sntomas no mejoran o si empeoran, consulte con su mdico o con su profesional de [employer]. No use lentes de contacto mientras tiene una infeccin ocular. Pregunte a su mdico o su profesional de la salud cuando puede volver a usar lentes de Pharmacologist. Qu efectos secundarios puedo tener al [employer] este  medicamento? Efectos secundarios que debe informar a su mdico o a Producer, television/film/video de la salud tan pronto como sea posible:  Therapist, art como erupcin cutnea, picazn o urticarias, hinchazn de la cara, labios o lengua  cambios en la visin  dolor, irritacin del ojo Efectos secundarios que, por lo general, no requieren atencin mdica (debe informarlos a su mdico o a su profesional de la salud si persisten o si son molestos):  mal sabor en la boca  ardor, escozor, irritacin al [employer] las gotas  congestin de la nariz  ojos secos  ojos llorosos Puede ser que esta lista no menciona todos los posibles efectos secundarios. Comunquese a su mdico por asesoramiento mdico [employer]. Usted puede informar los efectos secundarios a la FDA por telfono al 1-800-FDA-1088. Dnde debo guardar mi medicina? Mantngala fuera del alcance de los nios. Guarde el frasco sin abrir Airline pilot a Marketing executive, entre 2 y 8 grados C (36 y 67 grados F). Pollyann Savoy abierto para usar, gurdelo a Marketing executive, entre 2 y 25 grados C (36 y 73 grados F) hasta [address] No lo congele. Deseche todo el medicamento que no haya utilizado, despus de la fecha de vencimiento o despus de [address] desde que abri el frasco, el que llegue primero. ATENCIN: Este folleto es un resumen. Puede ser que no cubra toda la posible informacin. Si usted tiene preguntas acerca de esta medicina, consulte con su mdico, su farmacutico o su profesional de Radiographer, therapeutic.  2020 Elsevier/Gold Standard (2014-12-25 00:00:00) Azithromycin eye solution What is this medicine? AZITHROMYCIN (az ith roe MYE sin) is a macrolide antibiotic. It is used to treat bacterial eye infections. This  medicine may be used for other purposes; ask your health care provider or pharmacist if you have questions. COMMON BRAND NAME(S): Azasite What should I tell my health care provider before I take this medicine? They need to know if  you have any of these conditions:  wear contact lenses  an unusual or allergic reaction to azithromycin, erythromycin, other medicines, foods, dyes, or preservatives  pregnant or trying to get pregnant  breast-feeding How should I use this medicine? This medicine is only for use in the eye. Follow the directions on the prescription label. Wash hands before and after use. Try not to touch the tip of the dropper to any surface, including your eye. Tilt your head back slightly and pull your lower eyelid down with your index finger to form a pouch. Squeeze the prescribed number of drops into the pouch. Close the eye gently to spread the drops. Your vision may blur for a few minutes. Use your doses at regular intervals. Do not use your medicine more often than directed. Finish the full course that is prescribed even if you think your condition is better. Do not skip doses or stop your medicine early. Talk to your pediatrician regarding the use of this medicine in children. While this drug may be prescribed for children as young as 78 year old for selected conditions, precautions do apply. Overdosage: If you think you have taken too much of this medicine contact a poison control center or emergency room at once. NOTE: This medicine is only for you. Do not share this medicine with others. What if I miss a dose? If you miss a dose, use it as soon as you can. If it is almost time for your next dose, use only that dose. Do not use double or extra doses. What may interact with this medicine? Interactions are not expected. Do not use any other eye products without telling your prescriber or health care professional. This list may not describe all possible interactions. Give your health care provider a list of all the medicines, herbs, non-prescription drugs, or dietary supplements you use. Also tell them if you smoke, drink alcohol, or use illegal drugs. Some items may interact with your medicine. What should  I watch for while using this medicine? Tell your doctor or health care professional if your symptoms do not get better or if they get worse. Do not wear contact lenses while you have an eye infection. Ask your doctor or health care professional when you can start wearing your contacts again. What side effects may I notice from receiving this medicine? Side effects that you should report to your doctor or health care professional as soon as possible:  allergic reactions like skin rash, itching or hives, swelling of the face, lips, or tongue  changes in vision  eye irritation, pain Side effects that usually do not require medical attention (report to your doctor or health care professional if they continue or are bothersome):  bad taste in mouth  burning, stinging, irritation when drops used  congested nose  dry eyes  teary eyes This list may not describe all possible side effects. Call your doctor for medical advice about side effects. You may report side effects to FDA at 1-800-FDA-1088. Where should I keep my medicine? Keep out of the reach of children. Store the unopened bottle in the refrigerator between 2 and 8 degrees C (36 and 46 degrees F). Once opened for use, store between 2 and 25 degrees C (36 and 77  degrees F) for up to 14 days. Do not freeze. Throw away any unused medicine after the expiration date or 14 days after opening the bottle, whichever comes first. NOTE: This sheet is a summary. It may not cover all possible information. If you have questions about this medicine, talk to your doctor, pharmacist, or health care provider.  2020 Elsevier/Gold Standard (2008-01-30 14:57:20)

## 2020-11-10 NOTE — Telephone Encounter (Signed)
Yes, provider spoke with daughter as well and clarified eye ointment that was given and reviewed instructions on patient's ear drops.  Patient verbalized understanding of all instructions given and denies any further questions at this time. Advised daughter given reported glaucoma that he needs to be seen by opthalmology and he has been referred to [facility], provider received message that [facility] had called patient and that he needs to call them to schedule.

## 2020-11-10 NOTE — Telephone Encounter (Signed)
Copied from CRM [id number]. Topic: General - Other >> Nov 06, 2020  4:52 PM Laural Benes, [place] C wrote: Reason for CRM: pt daughter called in to find out the name of the eye drop that was prescribed?   Please assist.

## 2020-11-10 NOTE — Progress Notes (Signed)
Established patient visit   Patient: Matthew Jordan   DOB: 04-26-1951   69 y.o. Male  MRN: [id number] Visit Date: 11/10/2020  Today's healthcare provider: Jairo Ben, FNP   Chief Complaint  Patient presents with  . Follow-up   Subjective    HPI  Prediabetes, Follow-up  Lab Results  Component Value Date   HGBA1C 6.4 (H) 10/17/2020   HGBA1C 5.8 (H) 09/24/2020   GLUCOSE 110 (H) 10/17/2020   GLUCOSE 121 (H) 09/25/2020   GLUCOSE 154 (H) 09/24/2020    Last seen for for this2 weeks ago.  Management since that visit includes referral was placed for nutritionist  Current symptoms include none and have been unchanged. Prediabetes diet education given. Will have him return next visit and discuss how to check blood glucose with an in person interpreter.  Prior visit with dietician: no Current diet: well balanced Current exercise: none   Patient  denies any fever, body aches,chills, rash, chest pain, shortness of breath, nausea, vomiting, or diarrhea.  Denies dizziness, lightheadedness, pre syncopal or syncopal episodes.   Pertinent Labs:    Component Value Date/Time   CHOL 110 10/17/2020 0850   TRIG 97 10/17/2020 0850   CREATININE 0.92 10/17/2020 0850    Wt Readings from Last 3 Encounters:  11/10/20 253 lb 12.8 oz (115.1 kg)  10/16/20 256 lb 9.6 oz (116.4 kg)  09/25/20 253 lb 8.5 oz (115 kg)    -----------------------------------------------------------------------------------------  Patient Active Problem List   Diagnosis Date Noted  . No-show for appointment 10/23/2020  . Acute CHF (congestive heart failure) (HCC) 09/24/2020  . 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  . History of inguinal hernia repair, bilateral 04/06/2019  . Behavior concern 10/08/2015  . BPH with obstruction/lower urinary tract symptoms 10/07/2015  . Varicose veins 10/03/2015  . CAD (coronary artery disease)   . Arthritis   .  GERD (gastroesophageal reflux disease)   . Elevated PSA   . Left ventricular hypertrophy   . Cardiac dysrhythmia   . Dysphagia   . ED (erectile dysfunction)   . Essential hypertension 01/02/2015  . Abnormal ECG 01/02/2015   Past Medical History:  Diagnosis Date  . Arthritis   . BPH with urinary obstruction   . CAD (coronary artery disease) Fall 2014   40% stenosis of LAD on cardiac cath  . CAD (coronary artery disease)   . Cardiac arrhythmia   . Cardiac dysrhythmia   . Chest pain   . CHF (congestive heart failure) (HCC)   . Dysphagia   . ED (erectile dysfunction)   . Elevated PSA   . GERD (gastroesophageal reflux disease)   . Hypertension   . Irregular heart beat   . Left ventricular hypertrophy Fall 2014   mild concentric  . Prostate finding   . Sleep apnea   . SOB (shortness of breath)   . SOB (shortness of breath)    Allergies  Allergen Reactions  . Amoxicillin Other (See Comments)       Medications: Outpatient Medications Prior to Visit  Medication Sig  . apixaban (ELIQUIS) 5 MG TABS tablet Take 1 tablet (5 mg total) by mouth 2 (two) times daily.  Marland Kitchen atorvastatin (LIPITOR) 40 MG tablet Take 1 tablet (40 mg total) by mouth daily.  Marland Kitchen diltiazem (TIAZAC) 240 MG 24 hr capsule Take 1 tablet by mouth daily.  Marland Kitchen erythromycin ophthalmic ointment Place 1 application into both eyes 4 (four) times daily. (  0.5 % erythromycin ointment).Apply to affected eye four times daily only thin layer on eyelashes.  . furosemide (LASIX) 40 MG tablet Take 1 tablet (40 mg total) by mouth daily.  Marland Kitchen lisinopril-hydrochlorothiazide (ZESTORETIC) 10-12.5 MG tablet Take 1 tablet by mouth daily.  . tamsulosin (FLOMAX) 0.4 MG CAPS capsule Take 1 capsule (0.4 mg total) by mouth daily.  . traMADol (ULTRAM) 50 MG tablet Take 0.5-1 tablets (25-50 mg total) by mouth every 12 (twelve) hours as needed for moderate pain.   No facility-administered medications prior to visit.    Review of Systems   Constitutional: Negative.   HENT: Negative.   Eyes: Positive for discharge. Negative for photophobia, pain, redness, itching and visual disturbance.  Respiratory: Negative.   Cardiovascular: Negative.   Gastrointestinal: Negative.   Endocrine: Negative.   Genitourinary: Negative.   Musculoskeletal: Negative.   Skin: Negative.   Neurological: Negative.   Hematological: Negative.   Psychiatric/Behavioral: Negative.     Last CBC Lab Results  Component Value Date   WBC 7.3 10/17/2020   HGB 15.6 10/17/2020   HCT 46.0 10/17/2020   MCV 90 10/17/2020   MCH 30.4 10/17/2020   RDW 12.1 10/17/2020   PLT 208 [id number]   Last metabolic panel Lab Results  Component Value Date   GLUCOSE 110 (H) 10/17/2020   NA 141 10/17/2020   K 4.7 10/17/2020   CL 102 10/17/2020   CO2 25 10/17/2020   BUN 12 10/17/2020   CREATININE 0.92 10/17/2020   GFRNONAA 83 10/17/2020   GFRAA 96 10/17/2020   CALCIUM 9.7 10/17/2020   PROT 7.5 10/17/2020   ALBUMIN 4.6 10/17/2020   LABGLOB 2.9 10/17/2020   AGRATIO 1.6 10/17/2020   BILITOT 0.5 10/17/2020   ALKPHOS 87 10/17/2020   AST 21 10/17/2020   ALT 29 10/17/2020   ANIONGAP 6 09/25/2020   Last lipids Lab Results  Component Value Date   CHOL 110 10/17/2020   HDL 35 (L) 10/17/2020   LDLCALC 56 10/17/2020   TRIG 97 10/17/2020   Last hemoglobin A1c Lab Results  Component Value Date   HGBA1C 6.4 (H) 10/17/2020   Last thyroid functions Lab Results  Component Value Date   TSH 2.940 10/17/2020   T3TOTAL 136 09/25/2020   Last vitamin D No results found for: 25OHVITD2, 25OHVITD3, VD25OH Last vitamin B12 and Folate No results found for: VITAMINB12, FOLATE    Objective    BP 120/84   Pulse 73   Temp 98.1 F (36.7 C) (Oral)   Resp 16   Wt 253 lb 12.8 oz (115.1 kg)   SpO2 98%   BMI 36.42 kg/m  BP Readings from Last 3 Encounters:  11/10/20 120/84  10/16/20 (!) 114/52  09/25/20 133/84   Wt Readings from Last 3 Encounters:  11/10/20 253  lb 12.8 oz (115.1 kg)  10/16/20 256 lb 9.6 oz (116.4 kg)  09/25/20 253 lb 8.5 oz (115 kg)      Physical Exam Vitals and nursing note reviewed.  Constitutional:      General: He is active. He is not in acute distress.    Appearance: Normal appearance. He is well-developed and well-nourished. He is obese. He is not ill-appearing, toxic-appearing, sickly-appearing or diaphoretic.     Comments: Patient is alert and oriented and responsive to questions Engages in eye contact with provider. Speaks in full sentences without any pauses without any shortness of breath or distress.    HENT:     Head: Normocephalic and atraumatic.  Jaw: There is normal jaw occlusion.     Salivary Glands: Right salivary gland is not diffusely enlarged or tender. Left salivary gland is not diffusely enlarged or tender.     Right Ear: Hearing, tympanic membrane, ear canal and external ear normal. There is no impacted cerumen.     Left Ear: Hearing, tympanic membrane, ear canal and external ear normal. There is no impacted cerumen.     Nose: Nose normal. No congestion or rhinorrhea.     Mouth/Throat:     Mouth: Oropharynx is clear and moist and mucous membranes are normal. Mucous membranes are moist.     Pharynx: Uvula midline. No oropharyngeal exudate or posterior oropharyngeal erythema.  Eyes:     General: Lids are normal. No scleral icterus.       Right eye: No discharge.        Left eye: No discharge.     Extraocular Movements: Extraocular movements intact and EOM normal.     Conjunctiva/sclera: Conjunctivae normal.     Pupils: Pupils are equal, round, and reactive to light.  Neck:     Thyroid: No thyromegaly.     Vascular: Normal carotid pulses. No carotid bruit, hepatojugular reflux or JVD.     Trachea: Trachea and phonation normal. No tracheal tenderness or tracheal deviation.     Meningeal: Brudzinski's sign absent.  Cardiovascular:     Rate and Rhythm: Normal rate and regular rhythm.     Pulses:  Normal pulses and intact distal pulses.     Heart sounds: Normal heart sounds, S1 normal and S2 normal. Heart sounds not distant. No murmur heard. No friction rub. No gallop.   Pulmonary:     Effort: Pulmonary effort is normal. No accessory muscle usage or respiratory distress.     Breath sounds: Normal breath sounds. No stridor. No wheezing or rales.  Chest:     Chest wall: No tenderness.  Abdominal:     General: Bowel sounds are normal. There is no distension. Aorta is normal.     Palpations: Abdomen is soft. There is no mass.     Tenderness: There is no abdominal tenderness. There is no guarding or rebound.  Genitourinary:    Comments: No gynecology exams done in this office currently and no equipment available. Patient is aware she will have to see gynecology if needed for any pelvic/vaginal exam and will follow up with gynecology/obgyn as needed. Yearly gynecology pelvic exam recommended. Patient verbalized understanding of instructions and denies any further questions at this time.   Musculoskeletal:        General: No swelling, tenderness, deformity, signs of injury or edema. Normal range of motion.     Cervical back: Full passive range of motion without pain, normal range of motion and neck supple. No rigidity or tenderness.     Right lower leg: No edema.     Comments: Patient moves on and off of exam table and in room without difficulty. Gait is normal in hall and in room. Patient is oriented to person place time and situation. Patient answers questions appropriately and engages in conversation.   Lymphadenopathy:     Head:     Right side of head: No submental, submandibular, tonsillar, preauricular, posterior auricular or occipital adenopathy.     Left side of head: No submental, submandibular, tonsillar, preauricular, posterior auricular or occipital adenopathy.     Cervical: No cervical adenopathy.     Upper Body:  No axillary adenopathy present. Skin:  General: Skin is  warm, dry and intact.     Capillary Refill: Capillary refill takes less than 2 seconds.     Coloration: Skin is not pale.     Findings: No erythema or rash.     Nails: There is no clubbing or cyanosis.  Neurological:     General: No focal deficit present.     Mental Status: He is alert and oriented to person, place, and time.     GCS: GCS eye subscore is 4. GCS verbal subscore is 5. GCS motor subscore is 6.     Cranial Nerves: No cranial nerve deficit.     Sensory: No sensory deficit.     Motor: No weakness or abnormal muscle tone.     Coordination: He displays a negative Romberg sign. Coordination normal.     Gait: Gait normal.     Deep Tendon Reflexes: Strength normal and reflexes are normal and symmetric. Reflexes normal.  Psychiatric:        Mood and Affect: Mood and affect and mood normal.        Speech: Speech normal.        Behavior: Behavior normal.        Thought Content: Thought content normal.        Cognition and Memory: Cognition and memory normal.        Judgment: Judgment normal.       No results found for any visits on 11/10/20.  Assessment & Plan     Eye infection, right - Plan: azithromycin (AZASITE) 1 % ophthalmic solution  Otitis externa, unspecified chronicity, unspecified laterality, unspecified type - Plan: acetic acid-hydrocortisone (VOSOL-HC) OTIC solution  The patient is advised to begin progressive daily aerobic exercise program, follow a low fat, low cholesterol diet, attempt to lose weight, decrease or avoid alcohol intake, reduce salt in diet and cooking, reduce exposure to stress, improve dietary compliance, continue current medications, continue current healthy lifestyle patterns and return for routine annual checkups.  Return in about 1 month (around 12/11/2020), or if symptoms worsen or fail to improve, for at any time for any worsening symptoms, Go to Emergency room/ urgent care if worse.      Red Flags discussed. The patient was given clear  instructions to go to ER or return to medical center if any red flags develop, symptoms do not improve, worsen or new problems develop. They verbalized understanding.   The entirety of the information documented in the History of Present Illness, Review of Systems and Physical Exam were personally obtained by me. Portions of this information were initially documented by the CMA and reviewed by me for thoroughness and accuracy.      Marcille Buffy, [place] [phone] (phone) [phone] (fax)  [place]

## 2020-11-12 ENCOUNTER — Telehealth: Payer: Self-pay | Admitting: Adult Health

## 2020-11-12 DIAGNOSIS — E785 Hyperlipidemia, unspecified: Secondary | ICD-10-CM

## 2020-11-12 MED ORDER — ATORVASTATIN CALCIUM 40 MG PO TABS: 40.0000 mg | ORAL_TABLET | Freq: Every day | ORAL | 0 refills | Status: DC

## 2020-11-12 NOTE — Telephone Encounter (Signed)
[facility] faxed refill request for the following medications:  atorvastatin (LIPITOR) 40 MG tablet   Please advise. Thanks, TGH 

## 2020-11-16 ENCOUNTER — Emergency Department
Admission: EM | Admit: 2020-11-16 | Discharge: 2020-11-16 | Disposition: A | Discharge status: Home / Self Care | Payer: Medicare Other | Attending: Emergency Medicine | Admitting: Emergency Medicine

## 2020-11-16 ENCOUNTER — Encounter: Payer: Self-pay | Admitting: Intensive Care

## 2020-11-16 ENCOUNTER — Other Ambulatory Visit: Payer: Self-pay

## 2020-11-16 ENCOUNTER — Emergency Department: Payer: Medicare Other

## 2020-11-16 DIAGNOSIS — R059 Cough, unspecified: Secondary | ICD-10-CM | POA: Diagnosis present

## 2020-11-16 DIAGNOSIS — Z87891 Personal history of nicotine dependence: Secondary | ICD-10-CM | POA: Insufficient documentation

## 2020-11-16 DIAGNOSIS — I2510 Atherosclerotic heart disease of native coronary artery without angina pectoris: Secondary | ICD-10-CM | POA: Diagnosis not present

## 2020-11-16 DIAGNOSIS — J069 Acute upper respiratory infection, unspecified: Secondary | ICD-10-CM | POA: Insufficient documentation

## 2020-11-16 DIAGNOSIS — I509 Heart failure, unspecified: Secondary | ICD-10-CM | POA: Diagnosis not present

## 2020-11-16 DIAGNOSIS — Z7901 Long term (current) use of anticoagulants: Secondary | ICD-10-CM | POA: Insufficient documentation

## 2020-11-16 DIAGNOSIS — Z20822 Contact with and (suspected) exposure to covid-19: Secondary | ICD-10-CM | POA: Insufficient documentation

## 2020-11-16 DIAGNOSIS — I110 Hypertensive heart disease with heart failure: Secondary | ICD-10-CM | POA: Insufficient documentation

## 2020-11-16 DIAGNOSIS — Z79899 Other long term (current) drug therapy: Secondary | ICD-10-CM | POA: Insufficient documentation

## 2020-11-16 LAB — POC SARS CORONAVIRUS 2 AG -  ED: SARS Coronavirus 2 Ag: NEGATIVE

## 2020-11-16 MED ORDER — BENZONATATE 100 MG PO CAPS
ORAL_CAPSULE | ORAL | 0 refills | Status: DC
Start: 2020-11-16 — End: 2022-12-29

## 2020-11-16 MED ORDER — PREDNISONE 20 MG PO TABS: 40.0000 mg | ORAL_TABLET | Freq: Every day | ORAL | 0 refills | Status: AC

## 2020-11-16 MED ORDER — DEXAMETHASONE SODIUM PHOSPHATE 10 MG/ML IJ SOLN
10.0000 mg | Freq: Once | INTRAMUSCULAR | Status: AC
  Administered 2020-11-16: 10 mg via INTRAMUSCULAR
  Filled 2020-11-16: qty 1

## 2020-11-16 NOTE — Discharge Instructions (Addendum)
You are being treated as if you have Covid.  Your chest x-ray is negative for any signs of pneumonia.  Your viral upper respiratory infection and cough should respond to the prescription steroid and cough medicine provided.  Consider taking over-the-counter cough medicine for additional symptom relief.  Follow-up with your primary provider for ongoing symptoms.  You may return to the ED for worsening chest pain or shortness of breath.

## 2020-11-16 NOTE — ED Provider Notes (Signed)
[facility] Emergency Department Provider Note ____________________________________________  Time seen: 1710  I have reviewed the triage vital signs and the nursing notes.  HISTORY  Chief Complaint  Cough   HPI Matthew Jordan is a 70 y.o. male presents himself to the ED via EMS from home.  He presents at the advice of his family, after only complaint of intermittently productive cough.   Patient has also had complaints of increasing fatigue and a sore throat secondary to the protracted cough.  He denies any frank fevers, chills, or sweats.  He has a history of a fib. He denies any frank chest pain, syncope, or shortness of breath.  Past Medical History:  Diagnosis Date  . Arthritis   . BPH with urinary obstruction   . CAD (coronary artery disease) Fall 2014   40% stenosis of LAD on cardiac cath  . CAD (coronary artery disease)   . Cardiac arrhythmia   . Cardiac dysrhythmia   . Chest pain   . CHF (congestive heart failure) ([place])   . Dysphagia   . ED (erectile dysfunction)   . Elevated PSA   . GERD (gastroesophageal reflux disease)   . Hypertension   . Irregular heart beat   . Left ventricular hypertrophy Fall 2014   mild concentric  . Prostate finding   . Sleep apnea   . SOB (shortness of breath)   . SOB (shortness of breath)     Patient Active Problem List   Diagnosis Date Noted  . No-show for appointment 10/23/2020  . Acute CHF (congestive heart failure) ([place]) 09/24/2020  . 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  . History of inguinal hernia repair, bilateral 04/06/2019  . Behavior concern 10/08/2015  . BPH with obstruction/lower urinary tract symptoms 10/07/2015  . Varicose veins 10/03/2015  . CAD (coronary artery disease)   . Arthritis   . GERD (gastroesophageal reflux disease)   . Elevated PSA   . Left ventricular hypertrophy   . Cardiac dysrhythmia   . Dysphagia   . ED (erectile  dysfunction)   . Essential hypertension 01/02/2015  . Abnormal ECG 01/02/2015    Past Surgical History:  Procedure Laterality Date  . CARDIAC CATHETERIZATION  2014   [place]  . HERNIA REPAIR      Prior to Admission medications   Medication Sig Start Date End Date Taking? Authorizing Provider  benzonatate (TESSALON PERLES) 100 MG capsule Take 1-2 tabs TID prn cough 11/16/20  Yes Tmya Wigington, Dannielle Karvonen, PA-C  predniSONE (DELTASONE) 20 MG tablet Take 2 tablets (40 mg total) by mouth daily with breakfast for 5 days. 11/17/20 11/22/20 Yes Romell Cavanah, Dannielle Karvonen, PA-C  acetic acid-hydrocortisone (VOSOL-HC) OTIC solution Place 3 drops into both ears 3 (three) times daily for 15 days. EARS only 11/10/20 11/25/20  Flinchum, Kelby Aline, FNP  apixaban (ELIQUIS) 5 MG TABS tablet Take 1 tablet (5 mg total) by mouth 2 (two) times daily. 10/16/20   Flinchum, Kelby Aline, FNP  atorvastatin (LIPITOR) 40 MG tablet Take 1 tablet (40 mg total) by mouth daily. 11/12/20   Flinchum, Kelby Aline, FNP  azithromycin (AZASITE) 1 % ophthalmic solution Apply 1 drop to eye daily for 7 days. To affected EYE  only 11/10/20 11/17/20  Flinchum, Kelby Aline, FNP  diltiazem (TIAZAC) 240 MG 24 hr capsule Take 1 tablet by mouth daily. 10/20/20   [provider]  erythromycin ophthalmic ointment Place 1 application into both eyes 4 (four) times daily. (  0.5 % erythromycin ointment).Apply to affected eye four times daily only thin layer on eyelashes. 10/16/20   Flinchum, Kelby Aline, FNP  furosemide (LASIX) 40 MG tablet Take 1 tablet (40 mg total) by mouth daily. 10/16/20   Flinchum, Kelby Aline, FNP  lisinopril-hydrochlorothiazide (ZESTORETIC) 10-12.5 MG tablet Take 1 tablet by mouth daily. 10/16/20   Flinchum, Kelby Aline, FNP  tamsulosin (FLOMAX) 0.4 MG CAPS capsule Take 1 capsule (0.4 mg total) by mouth daily. 10/16/20   Flinchum, Kelby Aline, FNP  traMADol (ULTRAM) 50 MG tablet Take 0.5-1 tablets (25-50 mg total) by mouth every 12  (twelve) hours as needed for moderate pain. 10/22/20   Flinchum, Kelby Aline, FNP    Allergies Amoxicillin  Family History  Problem Relation Age of Onset  . Hypertension Mother   . Lung disease Mother   . Hypertension Father   . Cancer Father        possible prostate cancer  . Kidney disease Neg Hx     Social History Social History   Tobacco Use  . Smoking status: Former Smoker    Packs/day: 0.25    Years: 10.00    Pack years: 2.50    Types: Cigarettes    Quit date: 10/02/1980    Years since quitting: 40.1  . Smokeless tobacco: Never Used  Vaping Use  . Vaping Use: Never used  Substance Use Topics  . Alcohol use: Yes    Comment: weekly  . Drug use: No    Review of Systems  Constitutional: Negative for fever. Eyes: Negative for visual changes. ENT: Negative for sore throat. Cardiovascular: Negative for chest pain. Respiratory: Negative for shortness of breath. Reports cough as above.  Gastrointestinal: Negative for abdominal pain, vomiting and diarrhea. Genitourinary: Negative for dysuria. Musculoskeletal: Negative for back pain. Skin: Negative for rash. Neurological: Negative for headaches, focal weakness or numbness. ____________________________________________  PHYSICAL EXAM:  VITAL SIGNS: ED Triage Vitals  Enc Vitals Group     BP 11/16/20 1533 138/74     Pulse Rate 11/16/20 1533 87     Resp 11/16/20 1533 16     Temp 11/16/20 1533 99.7 F (37.6 C)     Temp Source 11/16/20 1533 Oral     SpO2 11/16/20 1533 95 %     Weight 11/16/20 1534 250 lb (113.4 kg)     Height 11/16/20 1534 5\' 10"  (1.778 m)     Head Circumference --      Peak Flow --      Pain Score 11/16/20 1533 0     Pain Loc --      Pain Edu? --      Excl. in [place]? --     Constitutional: Alert and oriented. Well appearing and in no distress. Head: Normocephalic and atraumatic. Eyes: Conjunctivae are normal. Normal extraocular movements Cardiovascular: Normal rate, regular rhythm. Normal  distal pulses. Respiratory: Normal respiratory effort. No wheezes/rales/rhonchi. Gastrointestinal: Soft and nontender. No distention. Musculoskeletal: Nontender with normal range of motion in all extremities.  Neurologic:  Normal gait without ataxia. Normal speech and language. No gross focal neurologic deficits are appreciated. Skin:  Skin is warm, dry and intact. No rash noted. Psychiatric: Mood and affect are normal. Patient exhibits appropriate insight and judgment. ____________________________________________   LABS (pertinent positives/negatives) Labs Reviewed  POC SARS CORONAVIRUS 2 AG -  ED  ___________________________________________   RADIOLOGY  CXR  Negative ____________________________________________  PROCEDURES  Decadron 10 mg IM  Procedures ____________________________________________  INITIAL IMPRESSION / ASSESSMENT AND PLAN /  ED COURSE  DDX: COVID, influenza, viral URI, bronchitis  Patient ED evaluation of symptoms concerning for Covid versus another viral etiology.  Discharge x-rays negative for any acute findings.  Patient be treated empirically with anti-inflammatories in the form of steroids, as well as [place].  He is encouraged to continue to monitor and treat fever and either symptoms with over-the-counter medications.  He should remain under house quarantine until symptoms improve.  He will follow with primary provider or return to the ED if needed.  Matthew Jordan was evaluated in Emergency Department on 11/16/2020 for the symptoms described in the history of present illness. He was evaluated in the context of the global COVID-19 pandemic, which necessitated consideration that the patient might be at risk for infection with the SARS-CoV-2 virus that causes COVID-19. Institutional protocols and algorithms that pertain to the evaluation of patients at risk for COVID-19 are in a state of rapid change based on information released by regulatory bodies  including the CDC and federal and state organizations. These policies and algorithms were followed during the patient's care in the ED. ____________________________________________  FINAL CLINICAL IMPRESSION(S) / ED DIAGNOSES  Final diagnoses:  Viral URI with cough      Keyontay Stolz, Charlesetta Ivory, PA-C 11/16/20 1909    Gilles Chiquito, MD 11/16/20 2140

## 2020-11-16 NOTE — ED Triage Notes (Signed)
Pt c/o cough X6 days and fatigue. Reports discomfort in chest from coughing so much.

## 2020-11-16 NOTE — ED Triage Notes (Addendum)
FIRST NURSE NOTE:  Pt arrived via [facility] with cough x 1 week.  VSS with EMS.  Pt has hx afib

## 2020-11-16 NOTE — ED Notes (Signed)
Pt reports cough and mild SOB x 3-4 day. Pt denies CP, N/V, fever, chills and appears to be in no acute respiratory distress. Pt A&O x 4 and ambulatory on arrival. Pt states he has not been in contact with anyone who he knows has Covid. Awaiting further orders. Will continue to monitor.

## 2020-11-26 ENCOUNTER — Telehealth: Payer: Self-pay | Admitting: Adult Health

## 2020-12-11 ENCOUNTER — Ambulatory Visit (INDEPENDENT_AMBULATORY_CARE_PROVIDER_SITE_OTHER): Payer: Self-pay | Admitting: Adult Health

## 2020-12-11 DIAGNOSIS — Z5329 Procedure and treatment not carried out because of patient's decision for other reasons: Secondary | ICD-10-CM

## 2020-12-11 DIAGNOSIS — Z91199 Patient's noncompliance with other medical treatment and regimen due to unspecified reason: Secondary | ICD-10-CM | POA: Insufficient documentation

## 2020-12-11 NOTE — Progress Notes (Signed)
No show for appointment on 12/11/20.

## 2020-12-31 ENCOUNTER — Telehealth: Payer: Self-pay | Admitting: Adult Health

## 2020-12-31 NOTE — Telephone Encounter (Signed)
Copied from [place] [id number]. Topic: Medicare AWV >> Dec 31, 2020  3:23 PM Cher Nakai R wrote: Reason for CRM:  No answer unable to leave a message for patient to call back and schedule Medicare Annual Wellness Visit (AWV) in office.   If not able to come in the office, please offer to do virtually or by telephone.   No hx of AWV - AWV-I eligible as of 03/16/2015   Please schedule at anytime with [facility] Health Advisor.   40 minute appointment  Any questions, please contact me at [phone]

## 2021-01-02 ENCOUNTER — Ambulatory Visit (INDEPENDENT_AMBULATORY_CARE_PROVIDER_SITE_OTHER): Payer: Self-pay | Admitting: Adult Health

## 2021-01-02 ENCOUNTER — Other Ambulatory Visit: Payer: Self-pay

## 2021-01-02 DIAGNOSIS — Z5329 Procedure and treatment not carried out because of patient's decision for other reasons: Secondary | ICD-10-CM

## 2021-01-02 DIAGNOSIS — Z91199 Patient's noncompliance with other medical treatment and regimen due to unspecified reason: Secondary | ICD-10-CM

## 2021-01-02 NOTE — Progress Notes (Signed)
   Complete physical exam  Patient: Matthew Jordan   DOB: 09/04/2001   70 y.o. Male  MRN: [id number]  Subjective:    No chief complaint on file.   Matthew Jordan is a 70 y.o. male who presents today for a complete physical exam. She reports consuming a {diet types:17450} diet. {types:19826} She generally feels {DESC; WELL/FAIRLY WELL/POORLY:18703}. She reports sleeping {DESC; WELL/FAIRLY WELL/POORLY:18703}. She {does/does not:200015} have additional problems to discuss today.    Most recent fall risk assessment:    05/12/2022   10:42 AM  Fall Risk   Falls in the past year? 0  Number falls in past yr: 0  Injury with Fall? 0  Risk for fall due to : No Fall Risks  Follow up Falls evaluation completed     Most recent depression screenings:    05/12/2022   10:42 AM 04/02/2021   10:46 AM  PHQ 2/9 Scores  PHQ - 2 Score 0 0  PHQ- 9 Score 5     {VISON DENTAL STD PSA (Optional):27386}  {History (Optional):23778}  Patient Care Team: Jessup, Joy, NP as PCP - General (Nurse Practitioner)   Outpatient Medications Prior to Visit  Medication Sig   fluticasone (FLONASE) 50 MCG/ACT nasal spray Place 2 sprays into both nostrils in the morning and at bedtime. After 7 days, reduce to once daily.   norgestimate-ethinyl estradiol (SPRINTEC 28) 0.25-35 MG-MCG tablet Take 1 tablet by mouth daily.   Nystatin POWD Apply liberally to affected area 2 times per day   spironolactone (ALDACTONE) 100 MG tablet Take 1 tablet (100 mg total) by mouth daily.   No facility-administered medications prior to visit.    ROS        Objective:     There were no vitals taken for this visit. {Vitals History (Optional):23777}  Physical Exam   No results found for any visits on 06/17/22. {Show previous labs (optional):23779}    Assessment & Plan:    Routine Health Maintenance and Physical Exam  Immunization History  Administered Date(s) Administered   DTaP 11/18/1999, 01/14/2000,  03/24/2000, 12/08/2000, 06/23/2004   Hepatitis A 04/19/2008, 04/25/2009   Hepatitis B 09/05/1999, 10/13/1999, 03/24/2000   HiB (PRP-OMP) 11/18/1999, 01/14/2000, 03/24/2000, 12/08/2000   IPV 11/18/1999, 01/14/2000, 09/12/2000, 06/23/2004   Influenza,inj,Quad PF,6+ Mos 07/26/2014   Influenza-Unspecified 10/25/2012   MMR 09/12/2001, 06/23/2004   Meningococcal Polysaccharide 04/24/2012   Pneumococcal Conjugate-13 12/08/2000   Pneumococcal-Unspecified 03/24/2000, 06/07/2000   Tdap 04/24/2012   Varicella 09/12/2000, 04/19/2008    Health Maintenance  Topic Date Due   HIV Screening  Never done   Hepatitis C Screening  Never done   INFLUENZA VACCINE  06/15/2022   PAP-Cervical Cytology Screening  06/17/2022 (Originally 09/03/2020)   PAP SMEAR-Modifier  06/17/2022 (Originally 09/03/2020)   TETANUS/TDAP  06/17/2022 (Originally 04/24/2022)   HPV VACCINES  Discontinued   COVID-19 Vaccine  Discontinued    Discussed health benefits of physical activity, and encouraged her to engage in regular exercise appropriate for her age and condition.  Problem List Items Addressed This Visit   None Visit Diagnoses     Annual physical exam    -  Primary   Cervical cancer screening       Need for Tdap vaccination          No follow-ups on file.     Joy Jessup, NP   

## 2021-01-06 ENCOUNTER — Encounter: Payer: Self-pay | Admitting: Adult Health

## 2021-01-06 ENCOUNTER — Ambulatory Visit (INDEPENDENT_AMBULATORY_CARE_PROVIDER_SITE_OTHER): Payer: Medicare Other | Admitting: Adult Health

## 2021-01-06 DIAGNOSIS — Z5329 Procedure and treatment not carried out because of patient's decision for other reasons: Secondary | ICD-10-CM

## 2021-01-06 NOTE — Progress Notes (Signed)
   Complete physical exam  Patient: Matthew Jordan   DOB: 09/04/2001   70 y.o. Male  MRN: [id number]  Subjective:    No chief complaint on file.   Matthew Jordan is a 70 y.o. male who presents today for a complete physical exam. She reports consuming a {diet types:17450} diet. {types:19826} She generally feels {DESC; WELL/FAIRLY WELL/POORLY:18703}. She reports sleeping {DESC; WELL/FAIRLY WELL/POORLY:18703}. She {does/does not:200015} have additional problems to discuss today.    Most recent fall risk assessment:    05/12/2022   10:42 AM  Fall Risk   Falls in the past year? 0  Number falls in past yr: 0  Injury with Fall? 0  Risk for fall due to : No Fall Risks  Follow up Falls evaluation completed     Most recent depression screenings:    05/12/2022   10:42 AM 04/02/2021   10:46 AM  PHQ 2/9 Scores  PHQ - 2 Score 0 0  PHQ- 9 Score 5     {VISON DENTAL STD PSA (Optional):27386}  {History (Optional):23778}  Patient Care Team: Jessup, Joy, NP as PCP - General (Nurse Practitioner)   Outpatient Medications Prior to Visit  Medication Sig   fluticasone (FLONASE) 50 MCG/ACT nasal spray Place 2 sprays into both nostrils in the morning and at bedtime. After 7 days, reduce to once daily.   norgestimate-ethinyl estradiol (SPRINTEC 28) 0.25-35 MG-MCG tablet Take 1 tablet by mouth daily.   Nystatin POWD Apply liberally to affected area 2 times per day   spironolactone (ALDACTONE) 100 MG tablet Take 1 tablet (100 mg total) by mouth daily.   No facility-administered medications prior to visit.    ROS        Objective:     There were no vitals taken for this visit. {Vitals History (Optional):23777}  Physical Exam   No results found for any visits on 06/17/22. {Show previous labs (optional):23779}    Assessment & Plan:    Routine Health Maintenance and Physical Exam  Immunization History  Administered Date(s) Administered   DTaP 11/18/1999, 01/14/2000,  03/24/2000, 12/08/2000, 06/23/2004   Hepatitis A 04/19/2008, 04/25/2009   Hepatitis B 09/05/1999, 10/13/1999, 03/24/2000   HiB (PRP-OMP) 11/18/1999, 01/14/2000, 03/24/2000, 12/08/2000   IPV 11/18/1999, 01/14/2000, 09/12/2000, 06/23/2004   Influenza,inj,Quad PF,6+ Mos 07/26/2014   Influenza-Unspecified 10/25/2012   MMR 09/12/2001, 06/23/2004   Meningococcal Polysaccharide 04/24/2012   Pneumococcal Conjugate-13 12/08/2000   Pneumococcal-Unspecified 03/24/2000, 06/07/2000   Tdap 04/24/2012   Varicella 09/12/2000, 04/19/2008    Health Maintenance  Topic Date Due   HIV Screening  Never done   Hepatitis C Screening  Never done   INFLUENZA VACCINE  06/15/2022   PAP-Cervical Cytology Screening  06/17/2022 (Originally 09/03/2020)   PAP SMEAR-Modifier  06/17/2022 (Originally 09/03/2020)   TETANUS/TDAP  06/17/2022 (Originally 04/24/2022)   HPV VACCINES  Discontinued   COVID-19 Vaccine  Discontinued    Discussed health benefits of physical activity, and encouraged her to engage in regular exercise appropriate for her age and condition.  Problem List Items Addressed This Visit   None Visit Diagnoses     Annual physical exam    -  Primary   Cervical cancer screening       Need for Tdap vaccination          No follow-ups on file.     Joy Jessup, NP   

## 2021-01-12 ENCOUNTER — Other Ambulatory Visit: Payer: Self-pay | Admitting: Adult Health

## 2021-01-12 ENCOUNTER — Telehealth: Payer: Self-pay

## 2021-01-12 DIAGNOSIS — H0100A Unspecified blepharitis right eye, upper and lower eyelids: Secondary | ICD-10-CM

## 2021-01-12 DIAGNOSIS — I509 Heart failure, unspecified: Secondary | ICD-10-CM

## 2021-01-12 NOTE — Telephone Encounter (Signed)
[facility] faxed refill request for the following medications:   furosemide (LASIX) 40 MG tablet  Please advise.  

## 2021-01-12 NOTE — Telephone Encounter (Signed)
We can fill thirty days as courtesy he has been discharged and letter sent to notify that he needs to find new pcp due to office no show policy

## 2021-01-12 NOTE — Telephone Encounter (Signed)
Patient has 4 no show appointments. Last office visit was 10/16/20 for new patient. Patient overdue for follow up,please advise if prescribing 30 days is appropriate till patient returns back to office or please advise if not appropriate to refill. KW

## 2021-01-13 MED ORDER — FUROSEMIDE 40 MG PO TABS: 40.0000 mg | ORAL_TABLET | Freq: Every day | ORAL | 0 refills | Status: DC

## 2021-01-13 NOTE — Addendum Note (Signed)
Addended by: Minette Headland on: 01/13/2021 09:25 AM   Modules accepted: Orders

## 2021-01-15 ENCOUNTER — Other Ambulatory Visit: Payer: Self-pay | Admitting: Adult Health

## 2021-01-15 ENCOUNTER — Telehealth: Payer: Self-pay | Admitting: Adult Health

## 2021-01-15 MED ORDER — DILTIAZEM HCL ER BEADS 240 MG PO CP24: 240.0000 mg | ORAL_CAPSULE | Freq: Every day | ORAL | 0 refills | Status: DC

## 2021-01-15 NOTE — Telephone Encounter (Signed)
[facility] faxed refill request for the following medications:  diltiazem (TIAZAC) 240 MG 24 hr capsule  Last Rx: Filled by a provider not in the office pharmacy noted last fill date was 10/17/2020 LOV: 11/10/20 Please advise. Thanks TNP

## 2021-01-15 NOTE — Telephone Encounter (Signed)
He is no longer a patient please review chart. KW

## 2021-01-15 NOTE — Progress Notes (Signed)
Courtesy refill patient sent discharge letter 01/06/21 and no refills after 30 days was notified to establish with new patient provider primary care and see his cardiologist.   Meds ordered this encounter  Medications  . diltiazem (TIAZAC) 240 MG 24 hr capsule    Sig: Take 1 capsule (240 mg total) by mouth daily. Please establish with new PCP and see cardiology as soon as possible this is courtesy refill.    Dispense:  30 capsule    Refill:  0

## 2021-01-15 NOTE — Telephone Encounter (Signed)
Meds ordered this encounter  Medications   diltiazem (TIAZAC) 240 MG 24 hr capsule    Sig: Take 1 capsule (240 mg total) by mouth daily. Please establish with new PCP and see cardiology as soon as possible this is courtesy refill.    Dispense:  30 capsule    Refill:  0   Discharge letter from practice per no show policy sent 3/41/96 and will give as above should notify him to fined new primary care and cardiologist as soon as possible this is courtesy refill.

## 2021-02-06 ENCOUNTER — Other Ambulatory Visit: Payer: Self-pay | Admitting: Adult Health

## 2021-02-06 DIAGNOSIS — H44001 Unspecified purulent endophthalmitis, right eye: Secondary | ICD-10-CM

## 2021-03-25 ENCOUNTER — Other Ambulatory Visit: Payer: Self-pay

## 2021-03-25 ENCOUNTER — Emergency Department
Admission: EM | Admit: 2021-03-25 | Discharge: 2021-03-25 | Disposition: A | Discharge status: Home / Self Care | Payer: Medicare Other | Attending: Emergency Medicine | Admitting: Emergency Medicine

## 2021-03-25 DIAGNOSIS — R109 Unspecified abdominal pain: Secondary | ICD-10-CM | POA: Diagnosis not present

## 2021-03-25 DIAGNOSIS — B342 Coronavirus infection, unspecified: Secondary | ICD-10-CM

## 2021-03-25 DIAGNOSIS — U071 COVID-19: Secondary | ICD-10-CM | POA: Diagnosis not present

## 2021-03-25 DIAGNOSIS — I2510 Atherosclerotic heart disease of native coronary artery without angina pectoris: Secondary | ICD-10-CM | POA: Diagnosis not present

## 2021-03-25 DIAGNOSIS — M25551 Pain in right hip: Secondary | ICD-10-CM | POA: Diagnosis not present

## 2021-03-25 DIAGNOSIS — H538 Other visual disturbances: Secondary | ICD-10-CM | POA: Insufficient documentation

## 2021-03-25 DIAGNOSIS — Z87891 Personal history of nicotine dependence: Secondary | ICD-10-CM | POA: Insufficient documentation

## 2021-03-25 DIAGNOSIS — I110 Hypertensive heart disease with heart failure: Secondary | ICD-10-CM | POA: Diagnosis not present

## 2021-03-25 DIAGNOSIS — M25552 Pain in left hip: Secondary | ICD-10-CM | POA: Insufficient documentation

## 2021-03-25 DIAGNOSIS — Z7901 Long term (current) use of anticoagulants: Secondary | ICD-10-CM | POA: Insufficient documentation

## 2021-03-25 DIAGNOSIS — R531 Weakness: Secondary | ICD-10-CM | POA: Diagnosis not present

## 2021-03-25 DIAGNOSIS — I509 Heart failure, unspecified: Secondary | ICD-10-CM | POA: Diagnosis not present

## 2021-03-25 DIAGNOSIS — R5383 Other fatigue: Secondary | ICD-10-CM | POA: Diagnosis present

## 2021-03-25 DIAGNOSIS — Z79899 Other long term (current) drug therapy: Secondary | ICD-10-CM | POA: Diagnosis not present

## 2021-03-25 LAB — CBC
HCT: 41.9 % (ref 39.0–52.0)
Hemoglobin: 14.6 g/dL (ref 13.0–17.0)
MCH: 31.1 pg (ref 26.0–34.0)
MCHC: 34.8 g/dL (ref 30.0–36.0)
MCV: 89.1 fL (ref 80.0–100.0)
Platelets: 164 10*3/uL (ref 150–400)
RBC: 4.7 MIL/uL (ref 4.22–5.81)
RDW: 12 % (ref 11.5–15.5)
WBC: 4.8 10*3/uL (ref 4.0–10.5)
nRBC: 0 % (ref 0.0–0.2)

## 2021-03-25 LAB — BASIC METABOLIC PANEL
Anion gap: 12 (ref 5–15)
BUN: 15 mg/dL (ref 8–23)
CO2: 23 mmol/L (ref 22–32)
Calcium: 8.9 mg/dL (ref 8.9–10.3)
Chloride: 103 mmol/L (ref 98–111)
Creatinine, Ser: 1.18 mg/dL (ref 0.61–1.24)
GFR, Estimated: >60 mL/min (ref >60)
Glucose, Bld: 164 mg/dL — ABNORMAL HIGH (ref 70–99)
Potassium: 3.4 mmol/L — ABNORMAL LOW (ref 3.5–5.1)
Sodium: 138 mmol/L (ref 135–145)

## 2021-03-25 LAB — RESP PANEL BY RT-PCR (FLU A&B, COVID) ARPGX2
Influenza A by PCR: NEGATIVE
Influenza B by PCR: NEGATIVE
SARS Coronavirus 2 by RT PCR: POSITIVE — AB

## 2021-03-25 NOTE — ED Triage Notes (Signed)
First Nurse Note: C/O general weakness and blurred vision x 3 weeks.  AAOx3.  Skin warm and dry.  NAD

## 2021-03-25 NOTE — ED Notes (Addendum)
Ed md made aware pt request for pain medication, pt never complained of pain to rn

## 2021-03-25 NOTE — ED Triage Notes (Signed)
Pt comes with c/o increased weakness for few weeks. Pt states some blurry vision, fatigue, belly pain, hip pain. Pt states he just feels he doesn't have any strength.   Pt does state recent COVID exposure.

## 2021-03-25 NOTE — ED Notes (Signed)
Ed rn checked on patient no complaints at this time all 4 p's addressed

## 2021-03-25 NOTE — ED Notes (Signed)
Instructed patient to take ibuprofen and tylenol and stay hydrated for his covid 19 positive result.

## 2021-03-25 NOTE — ED Provider Notes (Signed)
[facility] Emergency Department Provider Note   ____________________________________________   Event Date/Time   First MD Initiated Contact with Patient 03/25/21 1207     (approximate)  I have reviewed the triage vital signs and the nursing notes.   HISTORY  Chief Complaint Weakness    HPI Matthew Jordan is a 70 y.o. male with the below stated past medical history who presents for increased weakness over the last 2 weeks with associated blurred vision, fatigue, belly pain, and bilateral hip pain.  Patient is concerned as he has had a recent exposure to a person who is COVID-positive.  Patient states he has not tried any medications for these symptoms.  Patient currently denies any tinnitus, difficulty speaking, facial droop, sore throat, chest pain, shortness of breath, nausea/vomiting/diarrhea, dysuria, or weakness/numbness/paresthesias in any extremity         Past Medical History:  Diagnosis Date  . Arthritis   . BPH with urinary obstruction   . CAD (coronary artery disease) Fall 2014   40% stenosis of LAD on cardiac cath  . CAD (coronary artery disease)   . Cardiac arrhythmia   . Cardiac dysrhythmia   . Chest pain   . CHF (congestive heart failure) ([place])   . Dysphagia   . ED (erectile dysfunction)   . Elevated PSA   . GERD (gastroesophageal reflux disease)   . Hypertension   . Irregular heart beat   . Left ventricular hypertrophy Fall 2014   mild concentric  . Prostate finding   . Sleep apnea   . SOB (shortness of breath)   . SOB (shortness of breath)     Patient Active Problem List   Diagnosis Date Noted  . No-show for appointment 12/11/2020  . No-show for appointment 10/23/2020  . Acute CHF (congestive heart failure) ([place]) 09/24/2020  . 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  . History of inguinal hernia repair, bilateral 04/06/2019  . Behavior concern 10/08/2015  . BPH with  obstruction/lower urinary tract symptoms 10/07/2015  . Varicose veins 10/03/2015  . CAD (coronary artery disease)   . Arthritis   . GERD (gastroesophageal reflux disease)   . Elevated PSA   . Left ventricular hypertrophy   . Cardiac dysrhythmia   . Dysphagia   . ED (erectile dysfunction)   . Essential hypertension 01/02/2015  . Abnormal ECG 01/02/2015    Past Surgical History:  Procedure Laterality Date  . CARDIAC CATHETERIZATION  2014   [place]  . HERNIA REPAIR      Prior to Admission medications   Medication Sig Start Date End Date Taking? Authorizing Provider  apixaban (ELIQUIS) 5 MG TABS tablet Take 1 tablet (5 mg total) by mouth 2 (two) times daily. 10/16/20   Flinchum, Kelby Aline, FNP  atorvastatin (LIPITOR) 40 MG tablet Take 1 tablet (40 mg total) by mouth daily. 11/12/20   Flinchum, Kelby Aline, FNP  benzonatate (TESSALON PERLES) 100 MG capsule Take 1-2 tabs TID prn cough 11/16/20   Menshew, Dannielle Karvonen, PA-C  diltiazem (TIAZAC) 240 MG 24 hr capsule Take 1 capsule (240 mg total) by mouth daily. Please establish with new PCP and see cardiology as soon as possible this is courtesy refill. 01/15/21   Flinchum, Kelby Aline, FNP  erythromycin ophthalmic ointment Place 1 application into both eyes 4 (four) times daily. ( 0.5 % erythromycin ointment).Apply to affected eye four times daily only thin layer on eyelashes. 10/16/20   Flinchum, Kelby Aline, FNP  furosemide (LASIX) 40 MG tablet Take 1 tablet (40 mg total) by mouth daily. 01/13/21   Flinchum, Kelby Aline, FNP  lisinopril-hydrochlorothiazide (ZESTORETIC) 10-12.5 MG tablet Take 1 tablet by mouth daily. 10/16/20   Flinchum, Kelby Aline, FNP  tamsulosin (FLOMAX) 0.4 MG CAPS capsule Take 1 capsule (0.4 mg total) by mouth daily. 10/16/20   Flinchum, Kelby Aline, FNP  traMADol (ULTRAM) 50 MG tablet Take 0.5-1 tablets (25-50 mg total) by mouth every 12 (twelve) hours as needed for moderate pain. 10/22/20   Flinchum, Kelby Aline, FNP     Allergies Amoxicillin  Family History  Problem Relation Age of Onset  . Hypertension Mother   . Lung disease Mother   . Hypertension Father   . Cancer Father        possible prostate cancer  . Kidney disease Neg Hx     Social History Social History   Tobacco Use  . Smoking status: Former Smoker    Packs/day: 0.25    Years: 10.00    Pack years: 2.50    Types: Cigarettes    Quit date: 10/02/1980    Years since quitting: 40.5  . Smokeless tobacco: Never Used  Vaping Use  . Vaping Use: Never used  Substance Use Topics  . Alcohol use: Yes    Comment: weekly  . Drug use: No    Review of Systems Constitutional: No fever/chills Eyes: Endorses visual changes. ENT: No sore throat. Cardiovascular: Denies chest pain. Respiratory: Denies shortness of breath. Gastrointestinal: Endorses abdominal pain.  No nausea, no vomiting.  No diarrhea. Genitourinary: Negative for dysuria. Musculoskeletal: Negative for acute arthralgias Skin: Negative for rash. Neurological: Endorses generalized weakness.  Negative for headaches, numbness/paresthesias in any extremity Psychiatric: Negative for suicidal ideation/homicidal ideation   ____________________________________________   PHYSICAL EXAM:  VITAL SIGNS: ED Triage Vitals  Enc Vitals Group     BP 03/25/21 1104 90/68     Pulse Rate 03/25/21 1104 84     Resp 03/25/21 1104 18     Temp 03/25/21 1104 98.6 F (37 C)     Temp Source 03/25/21 1104 Oral     SpO2 03/25/21 1104 94 %     Weight --      Height --      Head Circumference --      Peak Flow --      Pain Score 03/25/21 1100 10     Pain Loc --      Pain Edu? --      Excl. in [place]? --    Constitutional: Alert and oriented. Well appearing and in no acute distress. Eyes: Conjunctivae are normal. PERRL. Head: Atraumatic. Nose: No congestion/rhinnorhea. Mouth/Throat: Mucous membranes are moist. Neck: No stridor Cardiovascular: Grossly normal heart sounds.  Good  peripheral circulation. Respiratory: Normal respiratory effort.  No retractions. Gastrointestinal: Soft and nontender. No distention. Musculoskeletal: No obvious deformities Neurologic:  Normal speech and language. No gross focal neurologic deficits are appreciated. Skin:  Skin is warm and dry. No rash noted. Psychiatric: Mood and affect are normal. Speech and behavior are normal.  ____________________________________________   LABS (all labs ordered are listed, but only abnormal results are displayed)  Labs Reviewed  RESP PANEL BY RT-PCR (FLU A&B, COVID) ARPGX2 - Abnormal; Notable for the following components:      Result Value   SARS Coronavirus 2 by RT PCR POSITIVE (*)    All other components within normal limits  BASIC METABOLIC PANEL - Abnormal; Notable for the following components:  Potassium 3.4 (*)    Glucose, Bld 164 (*)    All other components within normal limits  CBC  URINALYSIS, COMPLETE (UACMP) WITH MICROSCOPIC  CBG MONITORING, ED   ____________________________________________  EKG  ED ECG REPORT I, Naaman Plummer, the attending physician, personally viewed and interpreted this ECG.  Date: 03/25/2021 EKG Time: 1107 Rate: 86 Rhythm: normal sinus rhythm QRS Axis: normal Intervals: normal ST/T Wave abnormalities: normal Narrative Interpretation: no evidence of acute ischemia  PROCEDURES  Procedure(s) performed (including Critical Care):  .1-3 Lead EKG Interpretation Performed by: Naaman Plummer, MD Authorized by: Naaman Plummer, MD     Interpretation: normal     ECG rate:  84   ECG rate assessment: normal     Rhythm: sinus rhythm     Ectopy: none     Conduction: normal       ____________________________________________   INITIAL IMPRESSION / ASSESSMENT AND PLAN / ED COURSE  As part of my medical decision making, I reviewed the following data within the [place] notes reviewed and incorporated, Labs reviewed, EKG  interpreted, Old chart reviewed, Radiograph reviewed and Notes from prior ED visits reviewed and incorporated        Presentation most consistent with Viral Syndrome.  Patient has tested positive for COVID-19. Based on vitals and exam they are nontoxic and stable for discharge.  Given History and Exam I have a lower suspicion for: Emergent CardioPulmonary causes [such as Acute Asthma or COPD Exacerbation, acute Heart Failure or exacerbation, PE, PTX, atypical ACS, PNA]. Emergent Otolaryngeal causes [such as PTA, RPA, [place], Epiglottitis, EBV].  Regarding Emergent Travel or Immunosuppressive related infectious: I have a low suspicion for acute HIV.  Will provide strict return precautions and instructions on self-isolation/quarantine and anticipatory guidance.      ____________________________________________   FINAL CLINICAL IMPRESSION(S) / ED DIAGNOSES  Final diagnoses:  Generalized weakness  Coronavirus infection     ED Discharge Orders    None       Note:  This document was prepared using Dragon voice recognition software and may include unintentional dictation errors.   Naaman Plummer, MD 03/25/21 [phone]

## 2021-06-17 ENCOUNTER — Telehealth: Payer: Self-pay | Admitting: Adult Health

## 2021-06-17 NOTE — Telephone Encounter (Signed)
[facility] faxed refill request for the following medications:    ELIQUIS 5 MG TABLET   NOT ON MED LIST   Please advise.

## 2021-06-17 NOTE — Telephone Encounter (Signed)
No longer a pt here.   Thanks,   Mickel Baas

## 2021-09-01 ENCOUNTER — Other Ambulatory Visit: Payer: Self-pay

## 2021-09-01 ENCOUNTER — Encounter: Payer: Self-pay | Admitting: Emergency Medicine

## 2021-09-01 ENCOUNTER — Emergency Department: Payer: Medicare Other

## 2021-09-01 DIAGNOSIS — I2583 Coronary atherosclerosis due to lipid rich plaque: Secondary | ICD-10-CM | POA: Insufficient documentation

## 2021-09-01 DIAGNOSIS — R0602 Shortness of breath: Secondary | ICD-10-CM | POA: Diagnosis present

## 2021-09-01 DIAGNOSIS — Z7901 Long term (current) use of anticoagulants: Secondary | ICD-10-CM | POA: Insufficient documentation

## 2021-09-01 DIAGNOSIS — I509 Heart failure, unspecified: Secondary | ICD-10-CM | POA: Insufficient documentation

## 2021-09-01 DIAGNOSIS — I110 Hypertensive heart disease with heart failure: Secondary | ICD-10-CM | POA: Diagnosis not present

## 2021-09-01 DIAGNOSIS — Z79899 Other long term (current) drug therapy: Secondary | ICD-10-CM | POA: Insufficient documentation

## 2021-09-01 DIAGNOSIS — Z87891 Personal history of nicotine dependence: Secondary | ICD-10-CM | POA: Insufficient documentation

## 2021-09-01 DIAGNOSIS — Z20822 Contact with and (suspected) exposure to covid-19: Secondary | ICD-10-CM | POA: Diagnosis not present

## 2021-09-01 DIAGNOSIS — I2510 Atherosclerotic heart disease of native coronary artery without angina pectoris: Secondary | ICD-10-CM | POA: Insufficient documentation

## 2021-09-01 LAB — BASIC METABOLIC PANEL
Anion gap: 8 (ref 5–15)
BUN: 14 mg/dL (ref 8–23)
CO2: 28 mmol/L (ref 22–32)
Calcium: 9.5 mg/dL (ref 8.9–10.3)
Chloride: 102 mmol/L (ref 98–111)
Creatinine, Ser: 0.8 mg/dL (ref 0.61–1.24)
GFR, Estimated: >60 mL/min (ref >60)
Glucose, Bld: 117 mg/dL — ABNORMAL HIGH (ref 70–99)
Potassium: 3.8 mmol/L (ref 3.5–5.1)
Sodium: 138 mmol/L (ref 135–145)

## 2021-09-01 LAB — CBC
HCT: 47.1 % (ref 39.0–52.0)
Hemoglobin: 16.6 g/dL (ref 13.0–17.0)
MCH: 31.3 pg (ref 26.0–34.0)
MCHC: 35.2 g/dL (ref 30.0–36.0)
MCV: 88.9 fL (ref 80.0–100.0)
Platelets: 201 10*3/uL (ref 150–400)
RBC: 5.3 MIL/uL (ref 4.22–5.81)
RDW: 12.5 % (ref 11.5–15.5)
WBC: 6.6 10*3/uL (ref 4.0–10.5)
nRBC: 0 % (ref 0.0–0.2)

## 2021-09-01 LAB — BRAIN NATRIURETIC PEPTIDE: B Natriuretic Peptide: 20.7 pg/mL (ref 0.0–100.0)

## 2021-09-01 LAB — TROPONIN I (HIGH SENSITIVITY): Troponin I (High Sensitivity): 4 ng/L (ref <18)

## 2021-09-01 NOTE — ED Triage Notes (Signed)
EMS brings pt in from home for c/o [facility]; Hx CHF, HTN; just returned from [place] and could not get meds refilled; rt LL rhales

## 2021-09-02 ENCOUNTER — Emergency Department
Admission: EM | Admit: 2021-09-02 | Discharge: 2021-09-02 | Disposition: A | Discharge status: Home / Self Care | Payer: Medicare Other | Attending: Emergency Medicine | Admitting: Emergency Medicine

## 2021-09-02 DIAGNOSIS — I509 Heart failure, unspecified: Secondary | ICD-10-CM

## 2021-09-02 DIAGNOSIS — I2510 Atherosclerotic heart disease of native coronary artery without angina pectoris: Secondary | ICD-10-CM

## 2021-09-02 DIAGNOSIS — I2583 Coronary atherosclerosis due to lipid rich plaque: Secondary | ICD-10-CM

## 2021-09-02 LAB — TROPONIN I (HIGH SENSITIVITY): Troponin I (High Sensitivity): 6 ng/L (ref <18)

## 2021-09-02 LAB — RESP PANEL BY RT-PCR (FLU A&B, COVID) ARPGX2
Influenza A by PCR: NEGATIVE
Influenza B by PCR: NEGATIVE
SARS Coronavirus 2 by RT PCR: NEGATIVE

## 2021-09-02 LAB — D-DIMER, QUANTITATIVE: D-Dimer, Quant: 0.43 ug/mL-FEU (ref 0.00–0.50)

## 2021-09-02 MED ORDER — FUROSEMIDE 40 MG PO TABS: 40.0000 mg | ORAL_TABLET | Freq: Every day | ORAL | 0 refills | Status: DC

## 2021-09-02 MED ORDER — FUROSEMIDE 40 MG PO TABS
40.0000 mg | ORAL_TABLET | Freq: Once | ORAL | Status: AC
  Administered 2021-09-02: 40 mg via ORAL
  Filled 2021-09-02: qty 1

## 2021-09-02 MED ORDER — DILTIAZEM HCL ER BEADS 120 MG PO CP24: 120.0000 mg | ORAL_CAPSULE | Freq: Every day | ORAL | 0 refills | Status: DC

## 2021-09-02 MED ORDER — APIXABAN 5 MG PO TABS: 5.0000 mg | ORAL_TABLET | Freq: Two times a day (BID) | ORAL | 0 refills | Status: DC

## 2021-09-02 NOTE — ED Provider Notes (Signed)
[facility] Emergency Department Provider Note  ____________________________________________   Event Date/Time   First MD Initiated Contact with Patient 09/02/21 [phone]     (approximate)  I have reviewed the triage vital signs and the nursing notes.   HISTORY  Chief Complaint Shortness of Breath    HPI Matthew Jordan is a 70 y.o. male with past medical history of A. fib, CAD, CHF, LVH, here with shortness of breath, intermittent.  The patient states that over the last several weeks, he has had intermittent and increasingly recurrent episodes of shortness of breath.  These seem to be when he lays flat, but also with exertion.  Denies any chest pressure with this.  He states he has a history of having fluid on his lungs as well as irregular heartbeat.  He does occasionally feel his heartbeat irregularly and quickly.  He states these episodes have been increasingly frequent over the last month.  He has not had his medications in the last month because he recently moved back from [place].  He has been taking lisinopril and Flomax, however.  No other complaints.  No fevers or chills.  Denies any chest pressure with exertion.  Denies history of COPD or smoking history.  He does note he said some intermittent leg swelling.  No shortness of breath at rest.  No specific alleviating factors other than rest.    Past Medical History:  Diagnosis Date   Arthritis    BPH with urinary obstruction    CAD (coronary artery disease) Fall 2014   40% stenosis of LAD on cardiac cath   CAD (coronary artery disease)    Cardiac arrhythmia    Cardiac dysrhythmia    Chest pain    CHF (congestive heart failure) ([place])    Dysphagia    ED (erectile dysfunction)    Elevated PSA    GERD (gastroesophageal reflux disease)    Hypertension    Irregular heart beat    Left ventricular hypertrophy Fall 2014   mild concentric   Prostate finding    Sleep apnea    SOB (shortness of breath)     SOB (shortness of breath)     Patient Active Problem List   Diagnosis Date Noted   No-show for appointment 12/11/2020   No-show for appointment 10/23/2020   Acute CHF (congestive heart failure) ([place]) 09/24/2020   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   History of inguinal hernia repair, bilateral 04/06/2019   Behavior concern 10/08/2015   BPH with obstruction/lower urinary tract symptoms 10/07/2015   Varicose veins 10/03/2015   CAD (coronary artery disease)    Arthritis    GERD (gastroesophageal reflux disease)    Elevated PSA    Left ventricular hypertrophy    Cardiac dysrhythmia    Dysphagia    ED (erectile dysfunction)    Essential hypertension 01/02/2015   Abnormal ECG 01/02/2015    Past Surgical History:  Procedure Laterality Date   CARDIAC CATHETERIZATION  2014   [place]      Prior to Admission medications   Medication Sig Start Date End Date Taking? Authorizing Provider  apixaban (ELIQUIS) 5 MG TABS tablet Take 1 tablet (5 mg total) by mouth 2 (two) times daily. 09/02/21   Duffy Bruce, MD  atorvastatin (LIPITOR) 40 MG tablet Take 1 tablet (40 mg total) by mouth daily. 11/12/20   Flinchum, Kelby Aline, FNP  benzonatate (TESSALON PERLES) 100 MG capsule Take 1-2  tabs TID prn cough 11/16/20   Menshew, Dannielle Karvonen, PA-C  diltiazem (TIAZAC) 120 MG 24 hr capsule Take 1 capsule (120 mg total) by mouth daily. Please establish with new PCP and see cardiology as soon as possible this is courtesy refill. 09/02/21 10/02/21  Duffy Bruce, MD  erythromycin ophthalmic ointment Place 1 application into both eyes 4 (four) times daily. ( 0.5 % erythromycin ointment).Apply to affected eye four times daily only thin layer on eyelashes. 10/16/20   Flinchum, Kelby Aline, FNP  furosemide (LASIX) 40 MG tablet Take 1 tablet (40 mg total) by mouth daily. 09/02/21   Duffy Bruce, MD  lisinopril-hydrochlorothiazide (ZESTORETIC)  10-12.5 MG tablet Take 1 tablet by mouth daily. 10/16/20   Flinchum, Kelby Aline, FNP  tamsulosin (FLOMAX) 0.4 MG CAPS capsule Take 1 capsule (0.4 mg total) by mouth daily. 10/16/20   Flinchum, Kelby Aline, FNP  traMADol (ULTRAM) 50 MG tablet Take 0.5-1 tablets (25-50 mg total) by mouth every 12 (twelve) hours as needed for moderate pain. 10/22/20   Flinchum, Kelby Aline, FNP    Allergies Amoxicillin  Family History  Problem Relation Age of Onset   Hypertension Mother    Lung disease Mother    Hypertension Father    Cancer Father        possible prostate cancer   Kidney disease Neg Hx     Social History Social History   Tobacco Use   Smoking status: Former    Packs/day: 0.25    Years: 10.00    Pack years: 2.50    Types: Cigarettes    Quit date: 10/02/1980    Years since quitting: 40.9   Smokeless tobacco: Never  Vaping Use   Vaping Use: Never used  Substance Use Topics   Alcohol use: Yes    Comment: weekly   Drug use: No    Review of Systems  Review of Systems  Constitutional:  Positive for fatigue. Negative for chills and fever.  HENT:  Negative for sore throat.   Respiratory:  Positive for cough and shortness of breath.   Cardiovascular:  Positive for leg swelling. Negative for chest pain.  Gastrointestinal:  Negative for abdominal pain.  Genitourinary:  Negative for flank pain.  Musculoskeletal:  Negative for neck pain.  Skin:  Negative for rash and wound.  Allergic/Immunologic: Negative for immunocompromised state.  Neurological:  Negative for weakness and numbness.  Hematological:  Does not bruise/bleed easily.  All other systems reviewed and are negative.   ____________________________________________  PHYSICAL EXAM:      VITAL SIGNS: ED Triage Vitals  Enc Vitals Group     BP 09/01/21 2130 (!) 120/99     Pulse Rate 09/01/21 2130 (!) 116     Resp 09/01/21 2130 20     Temp 09/01/21 2130 98.2 F (36.8 C)     Temp Source 09/01/21 2130 Oral     SpO2  09/01/21 2126 96 %     Weight 09/01/21 2132 250 lb (113.4 kg)     Height 09/01/21 2132 5\' 10"  (1.778 m)     Head Circumference --      Peak Flow --      Pain Score 09/01/21 2131 4     Pain Loc --      Pain Edu? --      Excl. in [place]? --      Physical Exam Vitals and nursing note reviewed.  Constitutional:      General: He is not in acute distress.  Appearance: He is well-developed.  HENT:     Head: Normocephalic and atraumatic.  Eyes:     Conjunctiva/sclera: Conjunctivae normal.  Cardiovascular:     Rate and Rhythm: Normal rate and regular rhythm.     Heart sounds: Normal heart sounds. No murmur heard.   No friction rub.  Pulmonary:     Effort: Pulmonary effort is normal. No respiratory distress.     Breath sounds: Normal breath sounds. No wheezing or rales.  Abdominal:     General: There is no distension.     Palpations: Abdomen is soft.     Tenderness: There is no abdominal tenderness.  Musculoskeletal:     Cervical back: Neck supple.  Skin:    General: Skin is warm.     Capillary Refill: Capillary refill takes less than 2 seconds.  Neurological:     Mental Status: He is alert and oriented to person, place, and time.     Motor: No abnormal muscle tone.      ____________________________________________   LABS (all labs ordered are listed, but only abnormal results are displayed)  Labs Reviewed  BASIC METABOLIC PANEL - Abnormal; Notable for the following components:      Result Value   Glucose, Bld 117 (*)    All other components within normal limits  RESP PANEL BY RT-PCR (FLU A&B, COVID) ARPGX2  CBC  BRAIN NATRIURETIC PEPTIDE  D-DIMER, QUANTITATIVE (NOT AT [facility])  TROPONIN I (HIGH SENSITIVITY)  TROPONIN I (HIGH SENSITIVITY)    ____________________________________________  EKG: Sinus tachycardia with PVCs.  Ventricular rate 124, PR 168, QRS 98, QTc 468.  No acute ST elevations or depressions.  No EKG evidence of acute ischemia or  infarct. ________________________________________  RADIOLOGY All imaging, including plain films, CT scans, and ultrasounds, independently reviewed by me, and interpretations confirmed via formal radiology reads.  ED MD interpretation:   Chest x-ray: Clear  Official radiology report(s): DG Chest 2 View  Result Date: 09/01/2021 CLINICAL DATA:  Shortness of breath EXAM: CHEST - 2 VIEW COMPARISON:  11/16/2020 FINDINGS: Check shadow is within normal limits. Lungs are well aerated bilaterally. No focal infiltrate or sizable effusion is seen. No sizable effusion is noted. No acute bony abnormality is seen. IMPRESSION: No active cardiopulmonary disease. Electronically Signed   By: Inez Catalina M.D.   On: 09/01/2021 22:06    ____________________________________________  PROCEDURES   Procedure(s) performed (including Critical Care):  Procedures  ____________________________________________  INITIAL IMPRESSION / MDM / [place] / ED COURSE  As part of my medical decision making, I reviewed the following data within the [place] notes reviewed and incorporated, Old chart reviewed, Notes from prior ED visits, and [place] Controlled Substance Database       *CORVIN SORBO was evaluated in Emergency Department on 09/02/2021 for the symptoms described in the history of present illness. He was evaluated in the context of the global COVID-19 pandemic, which necessitated consideration that the patient might be at risk for infection with the SARS-CoV-2 virus that causes COVID-19. Institutional protocols and algorithms that pertain to the evaluation of patients at risk for COVID-19 are in a state of rapid change based on information released by regulatory bodies including the CDC and federal and state organizations. These policies and algorithms were followed during the patient's care in the ED.  Some ED evaluations and interventions may be delayed as a result of  limited staffing during the pandemic.*     Medical Decision Making: 70 year old  male with history of CHF, paroxysmal A. fib, CAD, here with intermittent shortness of breath.  Patient was initially tachycardic on arrival but is in normal sinus rhythm on my assessment after a long ED wait.  He appears relatively well in no distress.  I reviewed his labs and x-ray which were sent in triage.  Chest x-ray reviewed by me and is clear.  Lab work very reassuring with normal white count, normal BNP and troponin, normal BMP with normal renal function, and negative D-dimer.  COVID is negative.  Patient satting well on room air and is comfortable.  Blood pressure improved with resting in the ED.  I reviewed his previous notes, including his prior cardiology and primary notes.  He does appear to have A. fib and has been off of his Eliquis as well as his rate control and Lasix.  I suspect this is contributing to some mild shortness of breath related to CHF and A. fib.  I discussed that we will refill his medications here.  Will decrease his diltiazem as he is intermittently bradycardic here, though this seems to be a chronic issue and he has tolerated diltiazem in the past.  I encouraged him to follow-up with his cardiologist.  ____________________________________________  FINAL CLINICAL IMPRESSION(S) / ED DIAGNOSES  Final diagnoses:  Acute congestive heart failure, unspecified heart failure type ([place])  Coronary artery disease due to lipid rich plaque     MEDICATIONS GIVEN DURING THIS VISIT:  Medications  furosemide (LASIX) tablet 40 mg (40 mg Oral Given 09/02/21 0843)     ED Discharge Orders          Ordered    apixaban (ELIQUIS) 5 MG TABS tablet  2 times daily        09/02/21 0841    diltiazem (TIAZAC) 120 MG 24 hr capsule  Daily        09/02/21 0841    furosemide (LASIX) 40 MG tablet  Daily        09/02/21 0841             Note:  This document was prepared using Dragon voice recognition  software and may include unintentional dictation errors.   Duffy Bruce, MD 09/02/21 [phone]

## 2021-09-02 NOTE — Discharge Instructions (Signed)
It is VERY important to follow-up with a primary doctor in the next 1-2 weeks  Call one of the numbers above for an appointment

## 2021-09-03 NOTE — Progress Notes (Deleted)
   Patient ID: Matthew Jordan, male    DOB: [birth year], 70 y.o.   MRN: [id number]  HPI  Matthew Jordan is a 70 y/o male with a history of  Echo report from 09/25/20 reviewed and showed an EF of >75% without LVH.   Was in the ED 09/02/21 due to shortness of breath. Had not been taking medications for the last month due to recent move back from [place]. Medications resumed and he was released.   He presents today for his initial visit with a chief complaint of   Review of Systems    Physical Exam  Assessment & Plan:  1: Chronic heart failure with preserved ejection fraction without structural changes- - NYHA class - BNP 09/01/21 was 20.7  2: HTN- - BP - BMP 09/01/21 showed sodium 138, potassium 3.8, creatinine 0.8 and GFR >60  3: Atrial fibrillation- - saw cardiology ([place]) 01/15/21

## 2021-09-04 ENCOUNTER — Ambulatory Visit: Payer: Medicare Other | Admitting: Family

## 2021-09-10 ENCOUNTER — Ambulatory Visit: Payer: Medicare Other | Admitting: Cardiology

## 2021-09-11 ENCOUNTER — Other Ambulatory Visit: Payer: Self-pay

## 2021-09-11 ENCOUNTER — Ambulatory Visit: Payer: Medicare Other | Admitting: Cardiology

## 2021-09-11 ENCOUNTER — Encounter: Payer: Self-pay | Admitting: Cardiology

## 2021-09-11 VITALS — BP 130/80 | HR 84 | Ht 70.0 in | Wt 243.0 lb

## 2021-09-11 DIAGNOSIS — I480 Paroxysmal atrial fibrillation: Secondary | ICD-10-CM | POA: Diagnosis not present

## 2021-09-11 DIAGNOSIS — R0609 Other forms of dyspnea: Secondary | ICD-10-CM

## 2021-09-11 DIAGNOSIS — I10 Essential (primary) hypertension: Secondary | ICD-10-CM | POA: Diagnosis not present

## 2021-09-11 DIAGNOSIS — E785 Hyperlipidemia, unspecified: Secondary | ICD-10-CM | POA: Diagnosis not present

## 2021-09-11 MED ORDER — LISINOPRIL-HYDROCHLOROTHIAZIDE 10-12.5 MG PO TABS: 1.0000 | ORAL_TABLET | Freq: Every day | ORAL | 1 refills | Status: DC

## 2021-09-11 MED ORDER — DILTIAZEM HCL ER COATED BEADS 240 MG PO CP24
240.0000 mg | ORAL_CAPSULE | Freq: Every day | ORAL | 1 refills | Status: DC
Start: 2021-09-11 — End: 2022-04-19

## 2021-09-11 MED ORDER — APIXABAN 5 MG PO TABS
5.0000 mg | ORAL_TABLET | Freq: Two times a day (BID) | ORAL | 1 refills | Status: DC
Start: 2021-09-11 — End: 2022-04-19

## 2021-09-11 MED ORDER — ATORVASTATIN CALCIUM 40 MG PO TABS: 40.0000 mg | ORAL_TABLET | Freq: Every day | ORAL | 1 refills | Status: DC

## 2021-09-11 NOTE — Progress Notes (Signed)
Cardiology Office Note:    Date:  09/11/2021   ID:  Matthew Jordan, DOB Aug 19, 1951, MRN [id number]  PCP:  Pcp, No   [facility] Providers Cardiologist:  None     Referring MD: Duffy Bruce, MD   Chief Complaint  Patient presents with   New Patient (Initial Visit)    Referred by ED for SOB and CHF. Meds reviewed verbally with patient.     History of Present Illness:    Matthew Jordan is a 70 y.o. male with a hx of hypertension, HFpEF, paroxysmal atrial fibrillation who presents due to shortness of breath with exertion.  Patient last seen in the office in 2016.  He states being in [place] for a period of time, then [place].  Supposed to be on Eliquis, Xarelto, Lasix for A. fib and HFpEF, ran out of medications for about a month.  Had a 2-week period of worsening shortness of breath, her daughter advised her to go to the emergency room.  He was restarted on his home therapy, states his shortness of breath is improved we will do he still gets out of breath with exertion.  Denies chest pain, palpitations, dizziness, stroke.  Echo 09/2020 EF 41%, grade 2 diastolic dysfunction  Past Medical History:  Diagnosis Date   Arthritis    BPH with urinary obstruction    CAD (coronary artery disease) Fall 2014   40% stenosis of LAD on cardiac cath   CAD (coronary artery disease)    Cardiac arrhythmia    Cardiac dysrhythmia    Chest pain    CHF (congestive heart failure) (HCC)    Dysphagia    ED (erectile dysfunction)    Elevated PSA    GERD (gastroesophageal reflux disease)    Hypertension    Irregular heart beat    Left ventricular hypertrophy Fall 2014   mild concentric   Prostate finding    Sleep apnea    SOB (shortness of breath)    SOB (shortness of breath)     Past Surgical History:  Procedure Laterality Date   CARDIAC CATHETERIZATION  2014   [place]   HERNIA REPAIR      Current Medications: Current Meds  Medication Sig   benzonatate (TESSALON PERLES)  100 MG capsule Take 1-2 tabs TID prn cough   erythromycin ophthalmic ointment Place 1 application into both eyes 4 (four) times daily. ( 0.5 % erythromycin ointment).Apply to affected eye four times daily only thin layer on eyelashes.   furosemide (LASIX) 40 MG tablet Take 1 tablet (40 mg total) by mouth daily.   tamsulosin (FLOMAX) 0.4 MG CAPS capsule Take 1 capsule (0.4 mg total) by mouth daily.   traMADol (ULTRAM) 50 MG tablet Take 0.5-1 tablets (25-50 mg total) by mouth every 12 (twelve) hours as needed for moderate pain.     Allergies:   Amoxicillin   Social History   Socioeconomic History   Marital status: Divorced    Spouse name: Not on file   Number of children: Not on file   Years of education: Not on file   Highest education level: Not on file  Occupational History   Not on file  Tobacco Use   Smoking status: Former    Packs/day: 0.25    Years: 10.00    Pack years: 2.50    Types: Cigarettes    Quit date: 10/02/1980    Years since quitting: 40.9   Smokeless tobacco: Never  Vaping Use   Vaping Use: Never used  Substance and Sexual Activity   Alcohol use: Yes    Comment: weekly   Drug use: No   Sexual activity: Not on file  Other Topics Concern   Not on file  Social History Narrative   Not on file   Social Determinants of Health   Financial Resource Strain: Not on file  Food Insecurity: Not on file  Transportation Needs: Not on file  Physical Activity: Not on file  Stress: Not on file  Social Connections: Not on file     Family History: The patient's family history includes Cancer in his father; Hypertension in his father and mother; Lung disease in his mother. There is no history of Kidney disease.  ROS:   Please see the history of present illness.     All other systems reviewed and are negative.  EKGs/Labs/Other Studies Reviewed:    The following studies were reviewed today:   EKG:  EKG is  ordered today.  The ekg ordered today demonstrates  normal sinus rhythm, left anterior hemiblock.  Recent Labs: 09/24/2020: Magnesium 2.2 10/17/2020: ALT 29; TSH 2.940 09/01/2021: B Natriuretic Peptide 20.7; BUN 14; Creatinine, Ser 0.80; Hemoglobin 16.6; Platelets 201; Potassium 3.8; Sodium 138  Recent Lipid Panel    Component Value Date/Time   CHOL 110 10/17/2020 0850   TRIG 97 10/17/2020 0850   HDL 35 (L) 10/17/2020 0850   LDLCALC 56 10/17/2020 0850     Risk Assessment/Calculations:         Physical Exam:    VS:  BP 130/80 (BP Location: Left Arm, Patient Position: Sitting, Cuff Size: Normal)   Pulse 84   Ht 5\' 10"  (1.778 m)   Wt 243 lb (110.2 kg)   SpO2 96%   BMI 34.87 kg/m     Wt Readings from Last 3 Encounters:  09/11/21 243 lb (110.2 kg)  09/01/21 250 lb (113.4 kg)  11/16/20 250 lb (113.4 kg)     GEN:  Well nourished, well developed in no acute distress HEENT: Normal NECK: No JVD; No carotid bruits LYMPHATICS: No lymphadenopathy CARDIAC: RRR, no murmurs, rubs, gallops RESPIRATORY:  Clear to auscultation without rales, wheezing or rhonchi  ABDOMEN: Soft, non-tender, non-distended MUSCULOSKELETAL:  No edema; No deformity  SKIN: Warm and dry NEUROLOGIC:  Alert and oriented x 3 PSYCHIATRIC:  Normal affect   ASSESSMENT:    1. Dyspnea on exertion   2. Primary hypertension   3. Paroxysmal atrial fibrillation (HCC)   4. Hyperlipidemia, unspecified hyperlipidemia type    PLAN:    In order of problems listed above:  Dyspnea on exertion, history of HFpEF.  Obtain echocardiogram, obtain Lexiscan Myoview to evaluate any ischemia.  Euvolemic, continue Lasix 40 mg daily. Hypertension, BP controlled.  Cardizem to 40 mg, Zestoretic 1 tab daily. Paroxysmal atrial fibrillation, currently in sinus rhythm.  CHA2DS2-VASc score 3 (chf, age, htn).  Continue Eliquis, Cardizem.  Follow-up after echo and Myoview.   Shared Decision Making/Informed Consent The risks [chest pain, shortness of breath, cardiac arrhythmias,  dizziness, blood pressure fluctuations, myocardial infarction, stroke/transient ischemic attack, nausea, vomiting, allergic reaction, radiation exposure, metallic taste sensation and life-threatening complications (estimated to be 1 in 10,000)], benefits (risk stratification, diagnosing coronary artery disease, treatment guidance) and alternatives of a nuclear stress test were discussed in detail with Mr. Printy and he agrees to proceed.    Medication Adjustments/Labs and Tests Ordered: Current medicines are reviewed at length with the patient today.  Concerns regarding medicines are outlined above.  Orders Placed This Encounter  Procedures   NM Myocar Multi W/Spect W/Wall Motion / EF   Lipid panel   EKG 12-Lead   ECHOCARDIOGRAM COMPLETE    Meds ordered this encounter  Medications   diltiazem (CARDIZEM CD) 240 MG 24 hr capsule    Sig: Take 1 capsule (240 mg total) by mouth daily.    Dispense:  90 capsule    Refill:  1   apixaban (ELIQUIS) 5 MG TABS tablet    Sig: Take 1 tablet (5 mg total) by mouth 2 (two) times daily.    Dispense:  180 tablet    Refill:  1   lisinopril-hydrochlorothiazide (ZESTORETIC) 10-12.5 MG tablet    Sig: Take 1 tablet by mouth daily.    Dispense:  90 tablet    Refill:  1   atorvastatin (LIPITOR) 40 MG tablet    Sig: Take 1 tablet (40 mg total) by mouth daily.    Dispense:  90 tablet    Refill:  1     Patient Instructions  Medication Instructions:   Your physician recommends that you continue on your current medications as directed. Please refer to the Current Medication list given to you today.  *If you need a refill on your cardiac medications before your next appointment, please call your pharmacy*   Lab Work:  Your physician recommends that you return for a FASTING lipid profile: At your earliest convenience  - You will need to be fasting. Please do not have anything to eat or drink after midnight the morning you have the lab work. You may  only have water or black coffee with no cream or sugar.   Please return to our office on ___________________at__________________   Testing/Procedures:   Your physician has requested that you have an echocardiogram. Echocardiography is a painless test that uses sound waves to create images of your heart. It provides your doctor with information about the size and shape of your heart and how well your heart's chambers and valves are working. This procedure takes approximately one hour. There are no restrictions for this procedure.   2.   [place]     Your caregiver has ordered a Stress Test with nuclear imaging. The purpose of this test is to evaluate the blood supply to your heart muscle. This procedure is referred to as a "Non-Invasive Stress Test." This is because other than having an IV started in your vein, nothing is inserted or "invades" your body. Cardiac stress tests are done to find areas of poor blood flow to the heart by determining the extent of coronary artery disease (CAD). Some patients exercise on a treadmill, which naturally increases the blood flow to your heart, while others who are  unable to walk on a treadmill due to physical limitations have a pharmacologic/chemical stress agent called Lexiscan . This medicine will mimic walking on a treadmill by temporarily increasing your coronary blood flow.      PLEASE REPORT TO [facility] MEDICAL MALL ENTRANCE   THE VOLUNTEERS AT THE FIRST DESK WILL DIRECT YOU WHERE TO GO     *Please note: these test may take anywhere between 2-4 hours to complete       Date of Procedure:_____________________________________   Arrival Time for Procedure:______________________________    PLEASE NOTIFY THE OFFICE AT LEAST 24 HOURS IN ADVANCE IF YOU ARE UNABLE TO KEEP YOUR APPOINTMENT.  [place] 24 HOURS IN ADVANCE IF YOU ARE UNABLE TO KEEP YOUR APPOINTMENT. [phone]  How to  prepare for your Myoview test:    _XX___:  Hold other medications as follows: furosemide (LASIX)    1. Do not eat or drink after midnight  2. No caffeine for 24 hours prior to test  3. No smoking 24 hours prior to test.  4. Unless instructed otherwise, Take your medication with a small sips of water.       Follow-Up: At [facility], you and your health needs are our priority.  As part of our continuing mission to provide you with exceptional heart care, we have created designated Provider Care Teams.  These Care Teams include your primary Cardiologist (physician) and Advanced Practice Providers (APPs -  Physician Assistants and Nurse Practitioners) who all work together to provide you with the care you need, when you need it.  We recommend signing up for the patient portal called "MyChart".  Sign up information is provided on this After Visit Summary.  MyChart is used to connect with patients for Virtual Visits (Telemedicine).  Patients are able to view lab/test results, encounter notes, upcoming appointments, etc.  Non-urgent messages can be sent to your provider as well.   To learn more about what you can do with MyChart, go to [email or web address].    Your next appointment:   Follow up testing   The format for your next appointment:   In Person  Provider:   You may see Dr. Garen Lah or one of the following Advanced Practice Providers on your designated Care Team:   Murray Hodgkins, NP Christell Faith, PA-C Marrianne Mood, PA-C Cadence Warwick, [place]   Other Instructions    Signed, Kate Sable, MD  09/11/2021 12:43 PM    [employer]

## 2021-09-11 NOTE — Patient Instructions (Signed)
Medication Instructions:   Your physician recommends that you continue on your current medications as directed. Please refer to the Current Medication list given to you today.  *If you need a refill on your cardiac medications before your next appointment, please call your pharmacy*   Lab Work:  Your physician recommends that you return for a FASTING lipid profile: At your earliest convenience  - You will need to be fasting. Please do not have anything to eat or drink after midnight the morning you have the lab work. You may only have water or black coffee with no cream or sugar.   Please return to our office on ___________________at__________________   Testing/Procedures:   Your physician has requested that you have an echocardiogram. Echocardiography is a painless test that uses sound waves to create images of your heart. It provides your doctor with information about the size and shape of your heart and how well your heart's chambers and valves are working. This procedure takes approximately one hour. There are no restrictions for this procedure.   2.   [place]     Your caregiver has ordered a Stress Test with nuclear imaging. The purpose of this test is to evaluate the blood supply to your heart muscle. This procedure is referred to as a "Non-Invasive Stress Test." This is because other than having an IV started in your vein, nothing is inserted or "invades" your body. Cardiac stress tests are done to find areas of poor blood flow to the heart by determining the extent of coronary artery disease (CAD). Some patients exercise on a treadmill, which naturally increases the blood flow to your heart, while others who are  unable to walk on a treadmill due to physical limitations have a pharmacologic/chemical stress agent called Lexiscan . This medicine will mimic walking on a treadmill by temporarily increasing your coronary blood flow.      PLEASE REPORT TO [facility]TR[facility]    THE VOLUNTEERS AT THE FIRST DESK WILL DIRECT YOU WHERE TO GO     *Please note: these test may take anywhere between 2-4 hours to complete       Date of Procedure:_____________________________________   Arrival Time for Procedure:______________________________    PLEASE NOTIFY THE OFFICE AT LEAST 24 HOURS IN ADVANCE IF YOU ARE UNABLE TO KEEP YOUR APPOINTMENT.  [place] 24 HOURS IN ADVANCE IF YOU ARE UNABLE TO KEEP YOUR APPOINTMENT. [phone]         How to prepare for your Myoview test:    _XX___:  Hold other medications as follows: furosemide (LASIX)    1. Do not eat or drink after midnight  2. No caffeine for 24 hours prior to test  3. No smoking 24 hours prior to test.  4. Unless instructed otherwise, Take your medication with a small sips of water.       Follow-Up: At [facility], you and your health needs are our priority.  As part of our continuing mission to provide you with exceptional heart care, we have created designated Provider Care Teams.  These Care Teams include your primary Cardiologist (physician) and Advanced Practice Providers (APPs -  Physician Assistants and Nurse Practitioners) who all work together to provide you with the care you need, when you need it.  We recommend signing up for the patient portal called "MyChart".  Sign up information is provided on this After Visit Summary.  MyChart is used to connect with  patients for Virtual Visits (Telemedicine).  Patients are able to view lab/test results, encounter notes, upcoming appointments, etc.  Non-urgent messages can be sent to your provider as well.   To learn more about what you can do with MyChart, go to [email or web address].    Your next appointment:   Follow up testing   The format for your next appointment:   In Person  Provider:   You may see Dr. Garen Lah or one of the following Advanced Practice Providers on your designated  Care Team:   Murray Hodgkins, NP Christell Faith, PA-C Marrianne Mood, PA-C Cadence Kathlen Mody, [place]   Other Instructions

## 2021-09-12 NOTE — Progress Notes (Deleted)
   Patient ID: Matthew Jordan, male    DOB: July 29, 1951, 70 y.o.   MRN: [id number]  HPI  Matthew Jordan is a 70 y/o male with a history of  Echo report from 09/25/20 reviewed and showed an EF of >75% without LVH.   Was in the ED 09/02/21 due to shortness of breath. Had not been taking medications for the last month due to recent move back from [place]. Medications resumed and he was released.   He presents today for his initial visit with a chief complaint of   Review of Systems    Physical Exam  Assessment & Plan:  1: Chronic heart failure with preserved ejection fraction without structural changes- - NYHA class - BNP 09/01/21 was 20.7  2: HTN- - BP - BMP 09/01/21 showed sodium 138, potassium 3.8, creatinine 0.8 and GFR >60  3: Atrial fibrillation- - saw cardiology ([place]) 09/11/21

## 2021-09-14 ENCOUNTER — Telehealth: Payer: Self-pay | Admitting: Family

## 2021-09-14 ENCOUNTER — Ambulatory Visit: Payer: Medicare Other | Admitting: Family

## 2021-09-14 NOTE — Telephone Encounter (Signed)
Patient did not show for his Heart Failure Clinic appointment on 09/14/21. Will attempt to reschedule.

## 2021-09-16 ENCOUNTER — Ambulatory Visit
Admission: RE | Admit: 2021-09-16 | Discharge: 2021-09-16 | Disposition: A | Discharge status: Home / Self Care | Payer: Medicare Other | Source: Ambulatory Visit | Attending: Cardiology | Admitting: Cardiology

## 2021-09-16 ENCOUNTER — Other Ambulatory Visit: Payer: Self-pay

## 2021-09-16 DIAGNOSIS — R0609 Other forms of dyspnea: Secondary | ICD-10-CM | POA: Diagnosis not present

## 2021-09-16 LAB — NM MYOCAR MULTI W/SPECT W/WALL MOTION / EF
LV dias vol: 110 mL (ref 62–150)
LV sys vol: 34 mL
Nuc Stress EF: 69 %
Peak HR: 76 {beats}/min
Rest HR: 56 {beats}/min
Rest Nuclear Isotope Dose: 10.3 mCi
SDS: 3
SRS: 13
SSS: 13
ST Depression (mm): 0 mm
Stress Nuclear Isotope Dose: 29.2 mCi
TID: 1.06

## 2021-09-16 MED ORDER — TECHNETIUM TC 99M TETROFOSMIN IV KIT
10.0000 | PACK | Freq: Once | INTRAVENOUS | Status: AC | PRN
  Administered 2021-09-16: 10.3 via INTRAVENOUS

## 2021-09-16 MED ORDER — REGADENOSON 0.4 MG/5ML IV SOLN
0.4000 mg | Freq: Once | INTRAVENOUS | Status: AC
  Administered 2021-09-16: 0.4 mg via INTRAVENOUS

## 2021-09-16 MED ORDER — TECHNETIUM TC 99M TETROFOSMIN IV KIT
29.2000 | PACK | Freq: Once | INTRAVENOUS | Status: AC | PRN
  Administered 2021-09-16: 29.2 via INTRAVENOUS

## 2021-10-13 ENCOUNTER — Other Ambulatory Visit: Payer: Medicare Other

## 2021-10-19 ENCOUNTER — Ambulatory Visit: Payer: Medicare Other | Admitting: Cardiology

## 2021-10-20 ENCOUNTER — Encounter: Payer: Self-pay | Admitting: Cardiology

## 2021-12-07 DIAGNOSIS — H401133 Primary open-angle glaucoma, bilateral, severe stage: Secondary | ICD-10-CM | POA: Diagnosis not present

## 2021-12-07 DIAGNOSIS — H524 Presbyopia: Secondary | ICD-10-CM | POA: Diagnosis not present

## 2021-12-07 DIAGNOSIS — D3132 Benign neoplasm of left choroid: Secondary | ICD-10-CM | POA: Diagnosis not present

## 2022-01-04 DIAGNOSIS — H401133 Primary open-angle glaucoma, bilateral, severe stage: Secondary | ICD-10-CM | POA: Diagnosis not present

## 2022-02-02 NOTE — Progress Notes (Deleted)
? ?There were no vitals taken for this visit.  ? ?Subjective:  ? ? Patient ID: Matthew Jordan, male    DOB: 05/17/1951, 71 y.o.   MRN: [id number] ? ?HPI: ?Matthew Jordan is a 71 y.o. male ? ?No chief complaint on file. ? ?Patient presents to clinic to establish care with new PCP.  Introduced to Designer, jewellery role and practice setting.  All questions answered.  Discussed provider/patient relationship and expectations.  Patient reports a history of ***. ?Patient denies a history of: Hypertension, Elevated Cholesterol, Diabetes, Thyroid problems, Depression, Anxiety, Neurological problems, and Abdominal problems.  ? ?Active Ambulatory Problems  ?  Diagnosis Date Noted  ? Essential hypertension 01/02/2015  ? Abnormal ECG 01/02/2015  ? CAD (coronary artery disease)   ? Arthritis   ? GERD (gastroesophageal reflux disease)   ? Elevated PSA   ? Left ventricular hypertrophy   ? Cardiac dysrhythmia   ? Dysphagia   ? ED (erectile dysfunction)   ? Varicose veins 10/03/2015  ? 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 Behavior concern 10/08/2015  ? 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  ? 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  ? History of inguinal hernia repair, bilateral 04/06/2019  ? Acute CHF (congestive heart failure) ([place]) 09/24/2020  ? No-show for appointment 10/23/2020  ? No-show for appointment 12/11/2020  ? ?Resolved Ambulatory Problems  ?  Diagnosis Date Noted  ? Hypertension   ? SOB (shortness of breath)   ? Cardiac arrhythmia   ? BPH with urinary obstruction   ? Pain of both heels 10/03/2015  ? 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 ?Past Medical History:  ?Diagnosis Date  ? Chest pain   ? CHF (congestive heart failure) ([place])   ? Irregular heart beat   ? Prostate finding   ? Sleep apnea   ? ?Past Surgical History:  ?Procedure Laterality Date  ? CARDIAC CATHETERIZATION  2014  ? [place]  ? HERNIA REPAIR    ? ?Family History  ?Problem Relation Age of Onset  ? Hypertension  Mother   ? Lung disease Mother   ? Hypertension Father   ? Cancer Father   ?     possible prostate cancer  ? Kidney disease Neg Hx   ? ? ? ?Review of Systems ? ?Per HPI unless specifically indicated above ? ?   ?Objective:  ?  ?There were no vitals taken for this visit.  ?Wt Readings from Last 3 Encounters:  ?09/11/21 243 lb (110.2 kg)  ?09/01/21 250 lb (113.4 kg)  ?11/16/20 250 lb (113.4 kg)  ?  ?Physical Exam ? ?Results for orders placed or performed during the hospital encounter of 09/16/21  ?NM Myocar Multi W/Spect W/Wall Motion / EF  ?Result Value Ref Range  ? Rest HR 56.0 bpm  ? Rest BP 142/102 mmHg  ? Peak HR 76 bpm  ? Peak BP 142/102 mmHg  ? ST Depression (mm) 0 mm  ? Rest Nuclear Isotope Dose 10.3 mCi  ? Stress Nuclear Isotope Dose 29.2 mCi  ? SSS 13.0   ? SRS 13.0   ? SDS 3.0   ? TID 1.06   ? LV sys vol 34.0 mL  ? LV dias vol 110.0 62 - 150 mL  ? Nuc Stress EF 69 %  ? ?   ?Assessment & Plan:  ? ?Problem List Items Addressed This Visit   ?None ?  ? ?Follow up plan: ?No follow-ups on file. ? ? ? ? ? ?

## 2022-02-03 ENCOUNTER — Ambulatory Visit: Payer: Self-pay | Admitting: Nurse Practitioner

## 2022-02-03 DIAGNOSIS — Z7689 Persons encountering health services in other specified circumstances: Secondary | ICD-10-CM

## 2022-02-11 ENCOUNTER — Emergency Department: Payer: Medicare HMO

## 2022-02-11 ENCOUNTER — Emergency Department
Admission: EM | Admit: 2022-02-11 | Discharge: 2022-02-11 | Disposition: A | Discharge status: Home / Self Care | Payer: Medicare HMO | Attending: Emergency Medicine | Admitting: Emergency Medicine

## 2022-02-11 ENCOUNTER — Other Ambulatory Visit: Payer: Self-pay

## 2022-02-11 DIAGNOSIS — R6889 Other general symptoms and signs: Secondary | ICD-10-CM | POA: Diagnosis not present

## 2022-02-11 DIAGNOSIS — I4891 Unspecified atrial fibrillation: Secondary | ICD-10-CM | POA: Insufficient documentation

## 2022-02-11 DIAGNOSIS — I509 Heart failure, unspecified: Secondary | ICD-10-CM | POA: Insufficient documentation

## 2022-02-11 DIAGNOSIS — I110 Hypertensive heart disease with heart failure: Secondary | ICD-10-CM | POA: Insufficient documentation

## 2022-02-11 DIAGNOSIS — Z7901 Long term (current) use of anticoagulants: Secondary | ICD-10-CM | POA: Diagnosis not present

## 2022-02-11 DIAGNOSIS — R000 Tachycardia, unspecified: Secondary | ICD-10-CM | POA: Diagnosis not present

## 2022-02-11 DIAGNOSIS — R0602 Shortness of breath: Secondary | ICD-10-CM | POA: Diagnosis not present

## 2022-02-11 DIAGNOSIS — I2510 Atherosclerotic heart disease of native coronary artery without angina pectoris: Secondary | ICD-10-CM | POA: Insufficient documentation

## 2022-02-11 DIAGNOSIS — I499 Cardiac arrhythmia, unspecified: Secondary | ICD-10-CM | POA: Diagnosis not present

## 2022-02-11 LAB — CBC WITH DIFFERENTIAL/PLATELET
Abs Immature Granulocytes: 0.02 10*3/uL (ref 0.00–0.07)
Basophils Absolute: 0 10*3/uL (ref 0.0–0.1)
Basophils Relative: 0 %
Eosinophils Absolute: 0.1 10*3/uL (ref 0.0–0.5)
Eosinophils Relative: 1 %
HCT: 42.7 % (ref 39.0–52.0)
Hemoglobin: 14.7 g/dL (ref 13.0–17.0)
Immature Granulocytes: 0 %
Lymphocytes Relative: 19 %
Lymphs Abs: 1.2 10*3/uL (ref 0.7–4.0)
MCH: 30.6 pg (ref 26.0–34.0)
MCHC: 34.4 g/dL (ref 30.0–36.0)
MCV: 89 fL (ref 80.0–100.0)
Monocytes Absolute: 0.6 10*3/uL (ref 0.1–1.0)
Monocytes Relative: 9 %
Neutro Abs: 4.6 10*3/uL (ref 1.7–7.7)
Neutrophils Relative %: 71 %
Platelets: 192 10*3/uL (ref 150–400)
RBC: 4.8 MIL/uL (ref 4.22–5.81)
RDW: 12.2 % (ref 11.5–15.5)
WBC: 6.6 10*3/uL (ref 4.0–10.5)
nRBC: 0 % (ref 0.0–0.2)

## 2022-02-11 LAB — BRAIN NATRIURETIC PEPTIDE: B Natriuretic Peptide: 42.1 pg/mL (ref 0.0–100.0)

## 2022-02-11 LAB — COMPREHENSIVE METABOLIC PANEL
ALT: 30 U/L (ref 0–44)
AST: 29 U/L (ref 15–41)
Albumin: 3.4 g/dL — ABNORMAL LOW (ref 3.5–5.0)
Alkaline Phosphatase: 73 U/L (ref 38–126)
Anion gap: 9 (ref 5–15)
BUN: 15 mg/dL (ref 8–23)
CO2: 25 mmol/L (ref 22–32)
Calcium: 8.5 mg/dL — ABNORMAL LOW (ref 8.9–10.3)
Chloride: 106 mmol/L (ref 98–111)
Creatinine, Ser: 0.8 mg/dL (ref 0.61–1.24)
GFR, Estimated: >60 mL/min (ref >60)
Glucose, Bld: 143 mg/dL — ABNORMAL HIGH (ref 70–99)
Potassium: 3.4 mmol/L — ABNORMAL LOW (ref 3.5–5.1)
Sodium: 140 mmol/L (ref 135–145)
Total Bilirubin: 0.6 mg/dL (ref 0.3–1.2)
Total Protein: 6.6 g/dL (ref 6.5–8.1)

## 2022-02-11 MED ORDER — DILTIAZEM HCL ER COATED BEADS 240 MG PO CP24
240.0000 mg | ORAL_CAPSULE | Freq: Once | ORAL | Status: AC
  Administered 2022-02-11: 240 mg via ORAL
  Filled 2022-02-11: qty 1

## 2022-02-11 MED ORDER — DILTIAZEM HCL 25 MG/5ML IV SOLN
15.0000 mg | Freq: Once | INTRAVENOUS | Status: AC
  Administered 2022-02-11: 15 mg via INTRAVENOUS
  Filled 2022-02-11: qty 5

## 2022-02-11 NOTE — ED Notes (Signed)
Pt states that he has not taken his cardizem or atorvastatin  for the past two days.  He states that he does not know why. He states that he was dizzy when he got up out of bed this evening and called EMS.  Denies chest pain and describes feeling palpitations and "empty."  Pt states that he feels nauseas at this time. ?

## 2022-02-11 NOTE — Discharge Instructions (Signed)
Please start taking your diltiazem as perscribed.  ?

## 2022-02-11 NOTE — ED Provider Notes (Signed)
? ?[facility] ?Provider Note ? ? ? Event Date/Time  ? First MD Initiated Contact with Patient 02/11/22 1719   ?  (approximate) ? ? ?History  ? ?Irregular Heart Beat ? ? ?HPI ? ?Matthew Jordan is a 71 y.o. male  with pmh CAD, CHF, atrial fibrillation on eliquis presents with shortness of breath.  Patient notes that over the last patient was at when he woke up this morning he felt "disoriented" felt some fluttering in his chest and was somewhat short of breath.  Denies associated chest pain.  Denies lower extremity edema.  Did have some nausea no vomiting denies abdominal pain.  He has not taken his oral diltiazem for the past 2 days because he simply thought that he might not need it.  Has been compliant with his Eliquis.  Apparently heart rates were in the 180s with EMS received 5 mg of metoprolol.  Feeling improved at the time of my evaluation. ? ?  ? ?Past Medical History:  ?Diagnosis Date  ? Arthritis   ? BPH with urinary obstruction   ? CAD (coronary artery disease) Fall 2014  ? 40% stenosis of LAD on cardiac cath  ? CAD (coronary artery disease)   ? Cardiac arrhythmia   ? Cardiac dysrhythmia   ? Chest pain   ? CHF (congestive heart failure) ([place])   ? Dysphagia   ? ED (erectile dysfunction)   ? Elevated PSA   ? GERD (gastroesophageal reflux disease)   ? Hypertension   ? Irregular heart beat   ? Left ventricular hypertrophy Fall 2014  ? mild concentric  ? Prostate finding   ? Sleep apnea   ? SOB (shortness of breath)   ? SOB (shortness of breath)   ? ? ?Patient Active Problem List  ? Diagnosis Date Noted  ? No-show for appointment 12/11/2020  ? No-show for appointment 10/23/2020  ? Acute CHF (congestive heart failure) ([place]) 09/24/2020  ? 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  ? 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  ? History of inguinal hernia repair, bilateral 04/06/2019  ? Behavior concern 10/08/2015  ? 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 Varicose veins  10/03/2015  ? CAD (coronary artery disease)   ? Arthritis   ? GERD (gastroesophageal reflux disease)   ? Elevated PSA   ? Left ventricular hypertrophy   ? Cardiac dysrhythmia   ? Dysphagia   ? ED (erectile dysfunction)   ? Essential hypertension 01/02/2015  ? Abnormal ECG 01/02/2015  ? ? ? ?Physical Exam  ?Triage Vital Signs: ?ED Triage Vitals  ?Enc Vitals Group  ?   BP 02/11/22 1729 104/74  ?   Pulse Rate 02/11/22 1729 70  ?   Resp 02/11/22 1729 15  ?   Temp 02/11/22 1729 98.5 ?F (36.9 ?C)  ?   Temp Source 02/11/22 1729 Oral  ?   SpO2 02/11/22 1729 92 %  ?   Weight 02/11/22 1730 250 lb (113.4 kg)  ?   Height 02/11/22 1730 '5\' 9"'$  (1.753 m)  ?   Head Circumference --   ?   Peak Flow --   ?   Pain Score 02/11/22 1729 4  ?   Pain Loc --   ?   Pain Edu? --   ?   Excl. in [place]? --   ? ? ?Most recent vital signs: ?Vitals:  ? 02/11/22 1729 02/11/22 1830  ?BP: 104/74 (!) 112/94  ?Pulse: 70 73  ?Resp: 15 (!)  23  ?Temp: 98.5 ?F (36.9 ?C)   ?SpO2: 92% 95%  ? ? ? ?General: Awake, no distress.  ?CV:  Good peripheral perfusion. No edema ?Resp:  Normal effort. Lungs are clear ?Abd:  No distention.  ?Neuro:             Awake, Alert, Oriented x 3  ?Other:   ? ? ?ED Results / Procedures / Treatments  ?Labs ?(all labs ordered are listed, but only abnormal results are displayed) ?Labs Reviewed  ?COMPREHENSIVE METABOLIC PANEL - Abnormal; Notable for the following components:  ?    Result Value  ? Potassium 3.4 (*)   ? Glucose, Bld 143 (*)   ? Calcium 8.5 (*)   ? Albumin 3.4 (*)   ? All other components within normal limits  ?CBC WITH DIFFERENTIAL/PLATELET  ?BRAIN NATRIURETIC PEPTIDE  ? ? ? ?EKG ?Atrial fibrillation with RVR, PVCs, no acute ischemic changes ? ? ? ?RADIOLOGY ? ? ? ?PROCEDURES: ? ?Critical Care performed: Yes, see critical care procedure note(s) ? ?.1-3 Lead EKG Interpretation ?Performed by: Rada Hay, MD ?Authorized by: Rada Hay, MD  ? ?  Interpretation: abnormal   ?  ECG rate assessment: tachycardic   ?   Rhythm: atrial fibrillation   ?  Ectopy: none   ?  Conduction: normal   ? ?The patient is on the cardiac monitor to evaluate for evidence of arrhythmia and/or significant heart rate changes. ? ? ?MEDICATIONS ORDERED IN ED: ?Medications  ?diltiazem (CARDIZEM CD) 24 hr capsule 240 mg (has no administration in time range)  ?diltiazem (CARDIZEM) injection 15 mg (15 mg Intravenous Given 02/11/22 1840)  ? ? ? ?IMPRESSION / MDM / ASSESSMENT AND PLAN / ED COURSE  ?I reviewed the triage vital signs and the nursing notes. ?             ?               ? ?Differential diagnosis includes, but is not limited to, atrial fibrillation with RVR secondary to medication noncompliance, CHF exacerbation, electrolyte abnormality, less likely ACS, pulmonary embolism ? ?Patient is a 71 year old male with past medical history of coronary disease, atrial fibrillation on Eliquis and diltiazem and heart failure who presents with shortness of breath and feeling like his heart is fluttering.  He self DC'd his diltiazem 2 days ago for unclear reasons says that he just simply did not think he needed it.  This morning when he woke up felt somewhat short of breath and heart fluttering sensation but denies any chest pain.  Per EMS heart rates were in the 180s was given 5 mg diltiazem heart rates now in the 120s.  EKG showing A-fib with RVR with PVCs.  Patient hemodynamically stable saturating well on room air.  Does not appear volume overloaded.  Chest x-ray does not show signs of pulmonary edema.  I suspect that his A-fib with RVR is in the setting of medication noncompliance today.  He is not having chest pain my suspicion for pulmonary embolism is overall low.  I suspect that his symptoms are related to elevated heart rate.  Acute labs including CBC BNP and CMP which are overall reassuring normal renal function no leukocytosis or anemia BNP is negative.  After 15 mg of diltiazem heart rates are better controlled.  He was then given his p.o. dose of  diltiazem.  Advised to follow-up with Dr. Saralyn Pilar who is his cardiologist and continue to take the diltiazem as prescribed.  He is appropriate for discharge. ? ?  ? ? ?FINAL CLINICAL IMPRESSION(S) / ED DIAGNOSES  ? ?Final diagnoses:  ?Atrial fibrillation with RVR ([place])  ? ? ? ?Rx / DC Orders  ? ?ED Discharge Orders   ? ? None  ? ?  ? ? ? ?Note:  This document was prepared using Dragon voice recognition software and may include unintentional dictation errors. ?  ?Rada Hay, MD ?02/11/22 1934 ? ?

## 2022-02-11 NOTE — ED Triage Notes (Signed)
Pt came from home called out for irregular HR. Upon ems arrrival pt HR 182 in Afib , pt was given '5mg'$  of metoprolol and HR now 123. Bp 126/77. Pt takes eliquis. Cbg 146 ?

## 2022-02-18 DIAGNOSIS — K219 Gastro-esophageal reflux disease without esophagitis: Secondary | ICD-10-CM | POA: Diagnosis not present

## 2022-02-18 DIAGNOSIS — M5441 Lumbago with sciatica, right side: Secondary | ICD-10-CM | POA: Diagnosis not present

## 2022-02-18 DIAGNOSIS — Z880 Allergy status to penicillin: Secondary | ICD-10-CM | POA: Diagnosis not present

## 2022-02-18 DIAGNOSIS — N529 Male erectile dysfunction, unspecified: Secondary | ICD-10-CM | POA: Diagnosis not present

## 2022-02-18 DIAGNOSIS — I4891 Unspecified atrial fibrillation: Secondary | ICD-10-CM | POA: Diagnosis not present

## 2022-02-18 DIAGNOSIS — N400 Enlarged prostate without lower urinary tract symptoms: Secondary | ICD-10-CM | POA: Diagnosis not present

## 2022-02-18 DIAGNOSIS — I10 Essential (primary) hypertension: Secondary | ICD-10-CM | POA: Diagnosis not present

## 2022-02-18 DIAGNOSIS — Z7901 Long term (current) use of anticoagulants: Secondary | ICD-10-CM | POA: Diagnosis not present

## 2022-02-18 DIAGNOSIS — I2510 Atherosclerotic heart disease of native coronary artery without angina pectoris: Secondary | ICD-10-CM | POA: Diagnosis not present

## 2022-03-10 ENCOUNTER — Other Ambulatory Visit: Payer: Medicare Other

## 2022-03-19 ENCOUNTER — Encounter: Payer: Self-pay | Admitting: Cardiology

## 2022-03-19 NOTE — Progress Notes (Signed)
Unable to contact patient to schedule echocardiogram, letter sent, order cancelled. 

## 2022-03-26 DIAGNOSIS — R300 Dysuria: Secondary | ICD-10-CM | POA: Diagnosis not present

## 2022-03-26 DIAGNOSIS — I10 Essential (primary) hypertension: Secondary | ICD-10-CM | POA: Diagnosis not present

## 2022-04-13 DIAGNOSIS — H2513 Age-related nuclear cataract, bilateral: Secondary | ICD-10-CM | POA: Diagnosis not present

## 2022-04-13 DIAGNOSIS — H401134 Primary open-angle glaucoma, bilateral, indeterminate stage: Secondary | ICD-10-CM | POA: Diagnosis not present

## 2022-04-19 ENCOUNTER — Emergency Department
Admission: EM | Admit: 2022-04-19 | Discharge: 2022-04-19 | Disposition: A | Discharge status: Home / Self Care | Payer: Medicare HMO | Attending: Emergency Medicine | Admitting: Emergency Medicine

## 2022-04-19 ENCOUNTER — Other Ambulatory Visit: Payer: Self-pay

## 2022-04-19 DIAGNOSIS — I10 Essential (primary) hypertension: Secondary | ICD-10-CM | POA: Diagnosis not present

## 2022-04-19 DIAGNOSIS — I509 Heart failure, unspecified: Secondary | ICD-10-CM

## 2022-04-19 DIAGNOSIS — Z760 Encounter for issue of repeat prescription: Secondary | ICD-10-CM | POA: Diagnosis not present

## 2022-04-19 DIAGNOSIS — H0100A Unspecified blepharitis right eye, upper and lower eyelids: Secondary | ICD-10-CM

## 2022-04-19 MED ORDER — FUROSEMIDE 40 MG PO TABS: 40.0000 mg | ORAL_TABLET | Freq: Every day | ORAL | 0 refills | Status: DC

## 2022-04-19 MED ORDER — APIXABAN 5 MG PO TABS: 5.0000 mg | ORAL_TABLET | Freq: Two times a day (BID) | ORAL | 0 refills | Status: DC

## 2022-04-19 MED ORDER — TAMSULOSIN HCL 0.4 MG PO CAPS: 0.4000 mg | ORAL_CAPSULE | Freq: Every day | ORAL | 0 refills | Status: AC

## 2022-04-19 MED ORDER — LISINOPRIL-HYDROCHLOROTHIAZIDE 10-12.5 MG PO TABS: 1.0000 | ORAL_TABLET | Freq: Every day | ORAL | 0 refills | Status: DC

## 2022-04-19 MED ORDER — DILTIAZEM HCL ER COATED BEADS 240 MG PO CP24: 240.0000 mg | ORAL_CAPSULE | Freq: Every day | ORAL | 0 refills | Status: DC

## 2022-04-19 MED ORDER — TAMSULOSIN HCL 0.4 MG PO CAPS: 0.4000 mg | ORAL_CAPSULE | Freq: Every day | ORAL | 0 refills | Status: DC

## 2022-04-19 NOTE — ED Provider Notes (Signed)
   [facility] Provider Note    Event Date/Time   First MD Initiated Contact with Patient 04/19/22 1029     (approximate)   History   Medication Refill   HPI  Matthew Jordan is a 71 y.o. male  who comes in for medication refill. Pt got meds for his prostate and his heart.He rerpots history of Afib and HTN He ran out of his medications. Reprots out for 2 days and does not have a way to get them. He reports coming here to get refills.  Denies any bleeding issues or falls and hitting his head. Denies any chest pain no sob. Does not have a list of his medications with him.   Reviewed record from 3/30 seen for afib.   Pt okay with nurse tech interpreting.    Physical Exam   Triage Vital Signs: Blood pressure (!) 160/101, pulse 66, temperature 98.3 F (36.8 C), temperature source Oral, resp. rate 18, SpO2 96 %.   Most recent vital signs: Vitals:   04/19/22 1034  BP: (!) 160/101  Pulse: 66  Resp: 18  Temp: 98.3 F (36.8 C)  SpO2: 96%     General: Awake, no distress.  CV:  Good peripheral perfusion.  Resp:  Normal effort.  Abd:  No distention.  Other:     ED Results / Procedures / Treatments   Labs (all labs ordered are listed, but only abnormal results are displayed) Labs Reviewed - No data to display    PROCEDURES:  Critical Care performed: No  Procedures   MEDICATIONS ORDERED IN ED: Medications - No data to display   IMPRESSION / MDM / [place] / ED COURSE  I reviewed the triage vital signs and the nursing notes.   Patient's presentation is most consistent with needing med refills. Pt HTN but HR okay. But has missed 2 days of meds. I went over meds listed in chart which he denies any other med changes and does not have PCP.   Labs reviewed from [facility] and normal CR on 4/6  and normal white count and reassuring UA   Recommend starting medications and f/u with cards. Given 1 month prescriptions. Pt will double check  meds are similar to prior given he has old bottles at home.   The patient is on the cardiac monitor to evaluate for evidence of arrhythmia and/or significant heart rate changes.      FINAL CLINICAL IMPRESSION(S) / ED DIAGNOSES   Final diagnoses:  Medication refill     Rx / DC Orders   ED Discharge Orders          Ordered    apixaban (ELIQUIS) 5 MG TABS tablet  2 times daily        04/19/22 1037    tamsulosin (FLOMAX) 0.4 MG CAPS capsule  Daily        04/19/22 1037    diltiazem (CARDIZEM CD) 240 MG 24 hr capsule  Daily        04/19/22 1037    lisinopril-hydrochlorothiazide (ZESTORETIC) 10-12.5 MG tablet  Daily        04/19/22 1037    furosemide (LASIX) 40 MG tablet  Daily        04/19/22 1037             Note:  This document was prepared using Dragon voice recognition software and may include unintentional dictation errors.   Vanessa [place], MD 04/19/22 1054

## 2022-04-19 NOTE — Discharge Instructions (Addendum)
We refilled 1 month of prescriptions but call cards to get a follow up to get additional medications.

## 2022-04-19 NOTE — ED Triage Notes (Signed)
Pt states he needs his daily meds refilled. Pt is in NAD or other issues at this times

## 2022-04-20 ENCOUNTER — Ambulatory Visit: Payer: Medicare HMO | Admitting: Physician Assistant

## 2022-04-21 NOTE — Progress Notes (Unsigned)
New Patient Office Visit  I,Matthew Jordan,acting as a Education administrator for [employer], PA-C.,have documented all relevant documentation on the behalf of Matthew Speak, PA-C,as directed by  [employer], PA-C while in the presence of [employer], PA-C.  Subjective    Patient ID: Matthew Jordan, male    DOB: [birth year]  Age: 71 y.o. MRN: [id number]  CC:  Chief Complaint  Patient presents with   Establish Care   Hypertension joint pain and sciatica     HPI Matthew Jordan presents to establish care. Daughter is present. Last seen in office with Matthew Peace, FNP on 11-10-20.Patient wishes to reestablish care and management of chronic issues.  Patient went to ER on 04-19-22 for medication refill for prostate and heart.  He ran out of medication and stated he had no other way to get them. Patient advised to get a PCP. States it is his fault because he runs out of medicine and doesn't seek a doctor.  Patient complains of hypertension, joint pain/low back, hips, knees, A-fib, sciatica. Reports upcoming surgery for cataracts.  Reports many falls due to difficulty with vision.   Last lab from 02/18/22  Outpatient Encounter Medications as of 04/22/2022  Medication Sig   apixaban (ELIQUIS) 5 MG TABS tablet Take 1 tablet (5 mg total) by mouth 2 (two) times daily.   diltiazem (CARDIZEM CD) 240 MG 24 hr capsule Take 1 capsule (240 mg total) by mouth daily.   furosemide (LASIX) 40 MG tablet Take 1 tablet (40 mg total) by mouth daily.   lisinopril-hydrochlorothiazide (ZESTORETIC) 10-12.5 MG tablet Take 1 tablet by mouth daily.   methocarbamol (ROBAXIN) 500 MG tablet Take 500 mg by mouth 2 (two) times daily as needed.   ROCKLATAN 0.02-0.005 % SOLN Apply 1 drop to eye at bedtime.   tamsulosin (FLOMAX) 0.4 MG CAPS capsule Take 1 capsule (0.4 mg total) by mouth daily.   traMADol (ULTRAM) 50 MG tablet Take 0.5-1 tablets (25-50 mg total) by mouth every 12 (twelve) hours as needed for moderate pain.    atorvastatin (LIPITOR) 40 MG tablet Take 1 tablet (40 mg total) by mouth daily.   benzonatate (TESSALON PERLES) 100 MG capsule Take 1-2 tabs TID prn cough (Patient not taking: Reported on 04/22/2022)   erythromycin ophthalmic ointment Place 1 application into both eyes 4 (four) times daily. ( 0.5 % erythromycin ointment).Apply to affected eye four times daily only thin layer on eyelashes. (Patient not taking: Reported on 04/22/2022)   No facility-administered encounter medications on file as of 04/22/2022.    Past Medical History:  Diagnosis Date   Arthritis    BPH with urinary obstruction    CAD (coronary artery disease) Fall 2014   40% stenosis of LAD on cardiac cath   CAD (coronary artery disease)    Cardiac arrhythmia    Cardiac dysrhythmia    Chest pain    CHF (congestive heart failure) ([place])    Dysphagia    ED (erectile dysfunction)    Elevated PSA    GERD (gastroesophageal reflux disease)    Hypertension    Irregular heart beat    Left ventricular hypertrophy Fall 2014   mild concentric   Prostate finding    Sleep apnea    SOB (shortness of breath)    SOB (shortness of breath)     Past Surgical History:  Procedure Laterality Date   CARDIAC CATHETERIZATION  2014   [place]   HERNIA REPAIR      Family History  Problem Relation Age of Onset   Hypertension Mother    Lung disease Mother    Hypertension Father    Cancer Father        possible prostate cancer   Kidney disease Neg Hx     Social History   Socioeconomic History   Marital status: Divorced    Spouse name: Not on file   Number of children: Not on file   Years of education: Not on file   Highest education level: Not on file  Occupational History   Not on file  Tobacco Use   Smoking status: Former    Packs/day: 0.25    Years: 10.00    Total pack years: 2.50    Types: Cigarettes    Quit date: 10/02/1980    Years since quitting: 41.5   Smokeless tobacco: Never  Vaping Use   Vaping Use: Never used   Substance and Sexual Activity   Alcohol use: Yes    Comment: weekly   Drug use: No   Sexual activity: Not on file  Other Topics Concern   Not on file  Social History Narrative   Not on file   Social Determinants of Health   Financial Resource Strain: Not on file  Food Insecurity: Not on file  Transportation Needs: Not on file  Physical Activity: Not on file  Stress: Not on file  Social Connections: Not on file  Intimate Partner Violence: Not on file    Review of Systems  HENT:  Positive for ear pain and hearing loss.   Eyes:  Positive for photophobia, pain and redness.  Cardiovascular:  Positive for palpitations.  Genitourinary:  Positive for dysuria, frequency and urgency.  Musculoskeletal:  Positive for back pain, joint pain, myalgias and neck pain.  Neurological:  Positive for tremors.  All other systems reviewed and are negative.    Objective    BP 120/76 (BP Location: Right Arm, Patient Position: Sitting, Cuff Size: Large)   Pulse 75   Temp 97.6 F (36.4 C) (Oral)   Resp 16   Ht '5\' 10"'$  (1.778 m)   Wt 254 lb 3.2 oz (115.3 kg)   SpO2 95%   BMI 36.47 kg/m   Physical Exam Vitals reviewed.  Constitutional:      General: He is not in acute distress.    Appearance: Normal appearance. He is not diaphoretic.  HENT:     Head: Normocephalic and atraumatic.     Right Ear: There is impacted cerumen (partially).     Left Ear: There is impacted cerumen.     Nose: Nose normal.  Eyes:     General: No scleral icterus. Cardiovascular:     Rate and Rhythm: Normal rate and regular rhythm.     Pulses: Normal pulses.     Heart sounds: Normal heart sounds. No murmur heard. Pulmonary:     Effort: Pulmonary effort is normal. No respiratory distress.     Breath sounds: Normal breath sounds. No wheezing or rhonchi.  Abdominal:     General: Abdomen is flat. Bowel sounds are normal. There is no distension.     Palpations: Abdomen is soft.     Tenderness: There is no  abdominal tenderness.  Musculoskeletal:        General: No swelling or tenderness (r hip, b/l knee, chronic, low back).     Cervical back: Neck supple.     Right lower leg: No edema.     Left lower leg: No edema.  Comments: Negative straight leg raise test Varicose veins bilaterally  Lymphadenopathy:     Cervical: No cervical adenopathy.  Skin:    General: Skin is warm and dry.     Capillary Refill: Capillary refill takes less than 2 seconds.     Findings: No rash.  Neurological:     General: No focal deficit present.     Mental Status: He is alert and oriented to person, place, and time.     Cranial Nerves: No cranial nerve deficit.     Sensory: No sensory deficit.     Motor: No weakness.     Coordination: Coordination normal.     Deep Tendon Reflexes: Reflexes normal.  Psychiatric:        Mood and Affect: Mood normal.        Behavior: Behavior normal.        Thought Content: Thought content normal.        Judgment: Judgment normal.         Assessment & Plan:   Problem List Items Addressed This Visit   None 1. Chronic right hip pain - Ambulatory referral to Physical Therapy Advised OTC tylenol and topical antiinflammatory cream/pathes Lifestyle modification: weight loss, diet and exercise  2. Chronic knee pain, unspecified laterality  - Ambulatory referral to Physical Therapy  3. Low back pain without sciatica, unspecified back pain laterality, unspecified chronicity  - Ambulatory referral to Physical Therapy  4. 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 Stable Continue current regimen  5. Essential hypertension/CAD/hx of HF BP today was at goal Chronic. Stable Continue current regimen Pt reports that he did not need med refill today. Advised to refill his medication on time Managed by Cardiology Last was seen by Cardiology on 09/11/21 The ekg demonstrated normal sinus rhythm, left anterior hemiblock. Echo 09/2020 EF 75%, grade 2  diastolic dysfunction Pt will need to schedule fu appt with Cardiology Strongly encouraged to honor fu appt with Cardiology Pt and his daughter agreed/expressed their understanding  6. Hyperlipidemia, unspecified hyperlipidemia type Continue lipitor Might need to repeat FLP  7. Glaucoma of both eyes, unspecified glaucoma type Managed by optometry? Will need records from optometry about an oncoming surgery  8. Atrial fibrillation with RVR (HCC) Continue eliquis  9. Cerumen impaction Recommended to used OTC debrox Might proceed with cerumen removal at this next appt  FU in 1 mo  The patient was advised to call back or seek an in-person evaluation if the symptoms worsen or if the condition fails to improve as anticipated.  I discussed the assessment and treatment plan with the patient. The patient was provided an opportunity to ask questions and all were answered. The patient agreed with the plan and demonstrated an understanding of the instructions.  The entirety of the information documented in the History of Present Illness, Review of Systems and Physical Exam were personally obtained by me. Portions of this information were initially documented by the CMA and reviewed by me for thoroughness and accuracy.  Portions of this note were created using dictation software and may contain typographical errors.      Total encounter time more than 30 minutes Greater than 50% was spent in counseling and coordination of care with the patient  Matthew Jordan, [facility], [place] [phone] (phone) [phone] (fax)

## 2022-04-22 ENCOUNTER — Ambulatory Visit (INDEPENDENT_AMBULATORY_CARE_PROVIDER_SITE_OTHER): Payer: Medicare HMO | Admitting: Physician Assistant

## 2022-04-22 ENCOUNTER — Encounter: Payer: Self-pay | Admitting: Physician Assistant

## 2022-04-22 VITALS — BP 120/76 | HR 75 | Temp 97.6°F | Resp 16 | Ht 70.0 in | Wt 254.2 lb

## 2022-04-22 DIAGNOSIS — E785 Hyperlipidemia, unspecified: Secondary | ICD-10-CM

## 2022-04-22 DIAGNOSIS — M545 Low back pain, unspecified: Secondary | ICD-10-CM

## 2022-04-22 DIAGNOSIS — I10 Essential (primary) hypertension: Secondary | ICD-10-CM | POA: Diagnosis not present

## 2022-04-22 DIAGNOSIS — M25569 Pain in unspecified knee: Secondary | ICD-10-CM

## 2022-04-22 DIAGNOSIS — N401 Enlarged prostate with lower urinary tract symptoms: Secondary | ICD-10-CM | POA: Diagnosis not present

## 2022-04-22 DIAGNOSIS — I4891 Unspecified atrial fibrillation: Secondary | ICD-10-CM | POA: Diagnosis not present

## 2022-04-22 DIAGNOSIS — I2510 Atherosclerotic heart disease of native coronary artery without angina pectoris: Secondary | ICD-10-CM

## 2022-04-22 DIAGNOSIS — H409 Unspecified glaucoma: Secondary | ICD-10-CM

## 2022-04-22 DIAGNOSIS — N138 Other obstructive and reflux uropathy: Secondary | ICD-10-CM

## 2022-04-22 DIAGNOSIS — G8929 Other chronic pain: Secondary | ICD-10-CM

## 2022-04-22 DIAGNOSIS — M25551 Pain in right hip: Secondary | ICD-10-CM | POA: Diagnosis not present

## 2022-04-22 DIAGNOSIS — I2583 Coronary atherosclerosis due to lipid rich plaque: Secondary | ICD-10-CM

## 2022-06-01 ENCOUNTER — Ambulatory Visit: Payer: Medicare HMO | Admitting: Physician Assistant

## 2022-06-28 IMAGING — CT CT ANGIO CHEST
4 of 7 series · 18 of 46 positions shown · IV contrast (APPLIED)
Comparison: Chest x-ray from earlier today and chest CT 12/27/2014

CLINICAL DATA: Chest pain with nonspecific x-ray possibly showing
dilated aorta.

EXAM:
CT ANGIOGRAPHY CHEST WITH CONTRAST
TECHNIQUE: Multidetector CT imaging of the chest was performed using the
standard protocol during bolus administration of intravenous
contrast. Multiplanar CT image reconstructions and MIPs were
obtained to evaluate the vascular anatomy.
CONTRAST:  100mL OMNIPAQUE IOHEXOL 350 MG/ML SOLN

[Series 4: axial pre · axial · non-contrast · 0.81mm/px · z∈[-382,-137]mm · 5 of 75 slices shown]
[im 13/75  lung]
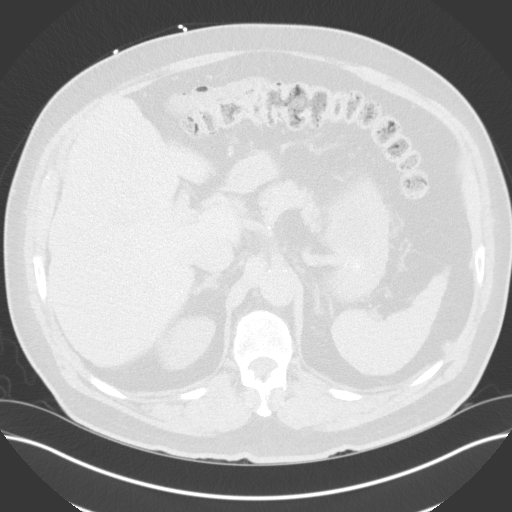
[im 25/75  lung]
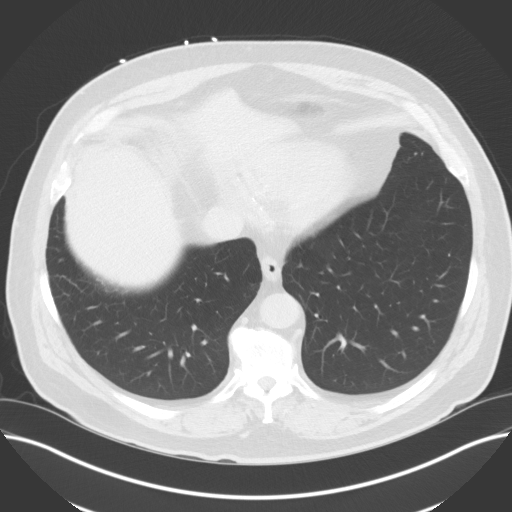
[im 38/75  lung]
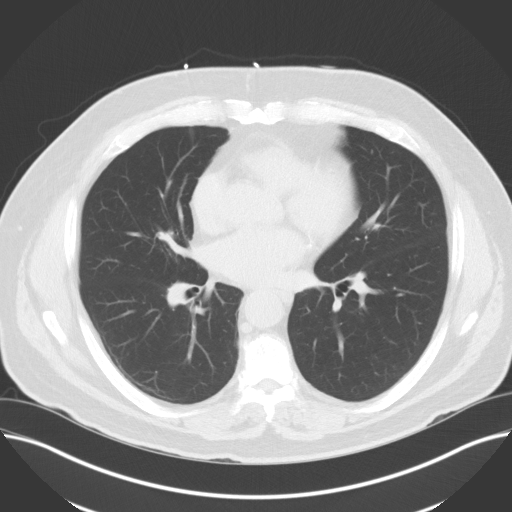
[im 50/75  lung]
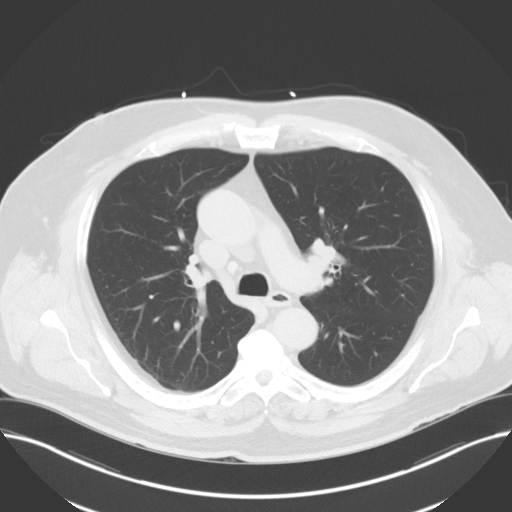
[im 62/75  lung]
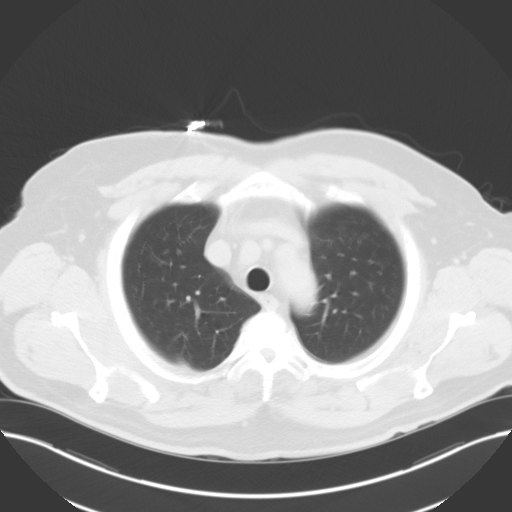

[Series 5: axial arterial · axial · arterial · 0.81mm/px · z∈[-381,-111]mm · 8 of 116 slices shown]
[im 13/116  lung]
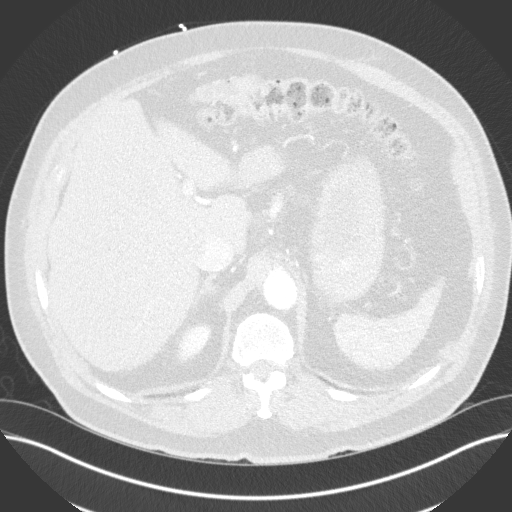
[im 26/116  soft-tissue]
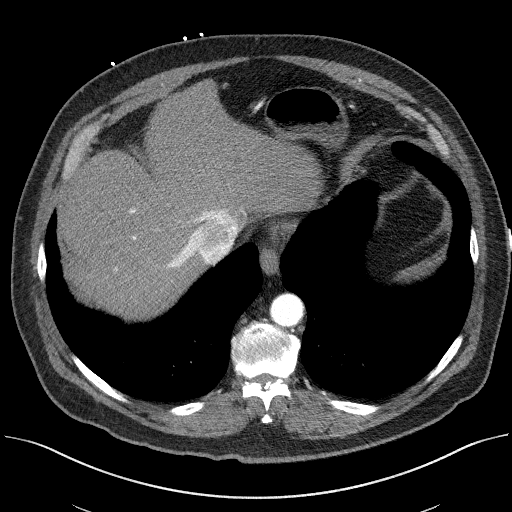
[im 39/116  lung]
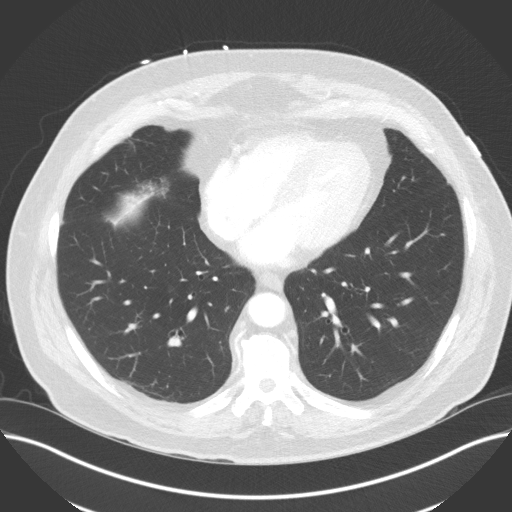
[im 52/116  soft-tissue]
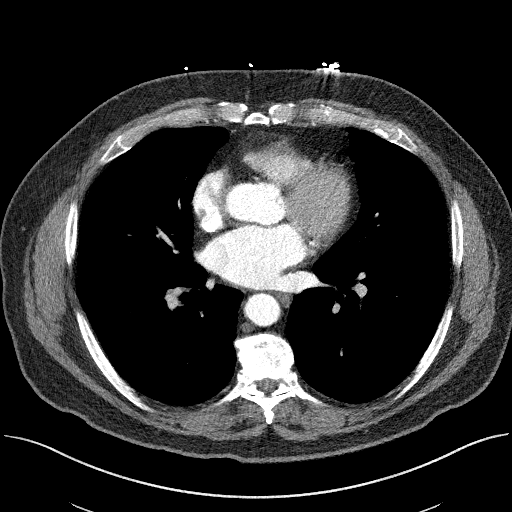
[im 64/116  lung]
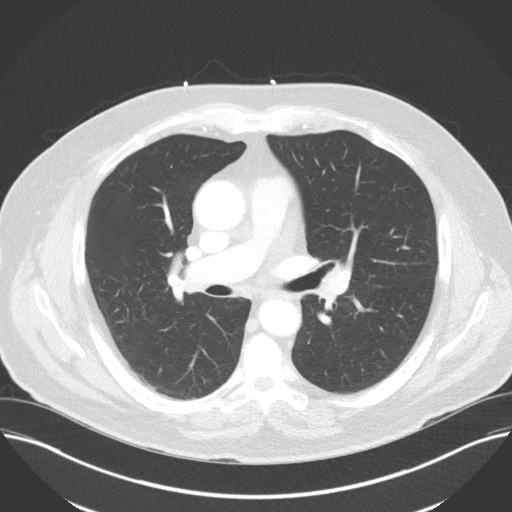
[im 77/116  soft-tissue]
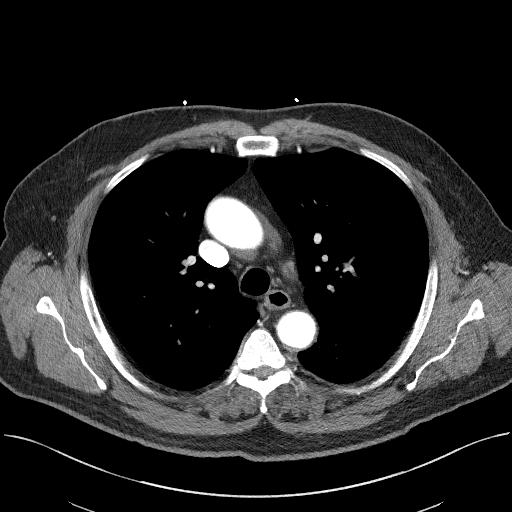
[im 90/116  lung]
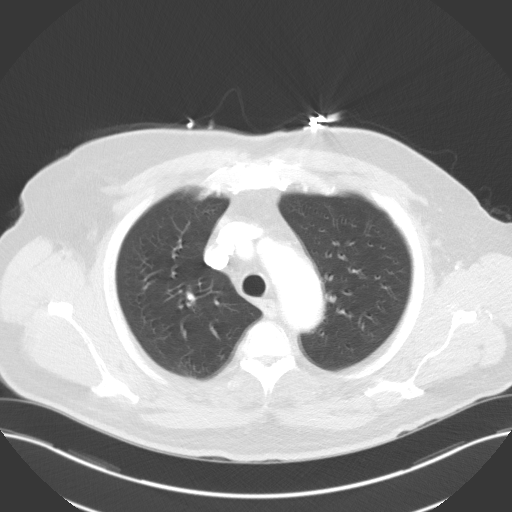
[im 103/116  soft-tissue]
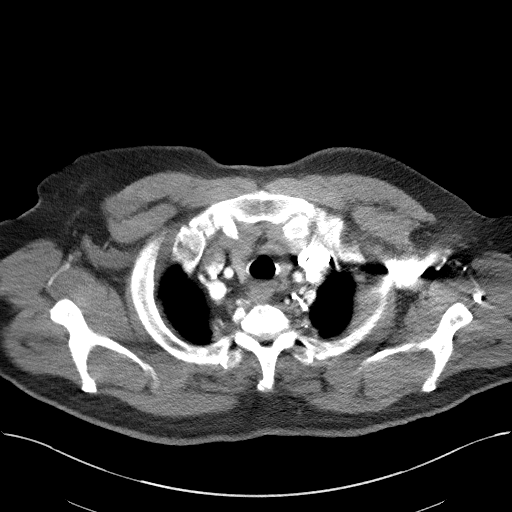

[Series 6: lung · axial · 0.81mm/px · z∈[-394,-344]mm · 2 of 174 slices shown]
[im 13/174  soft-tissue]
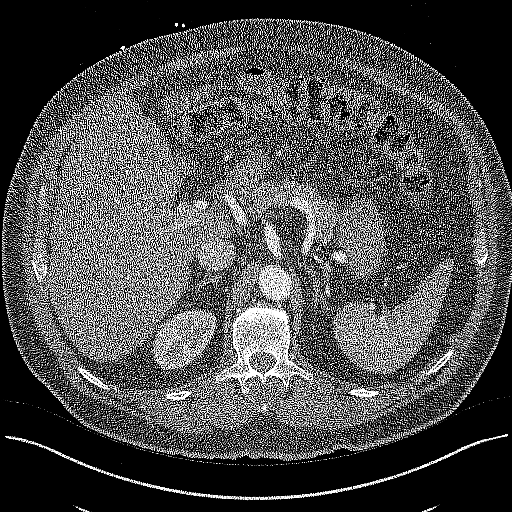
[im 38/174  soft-tissue]
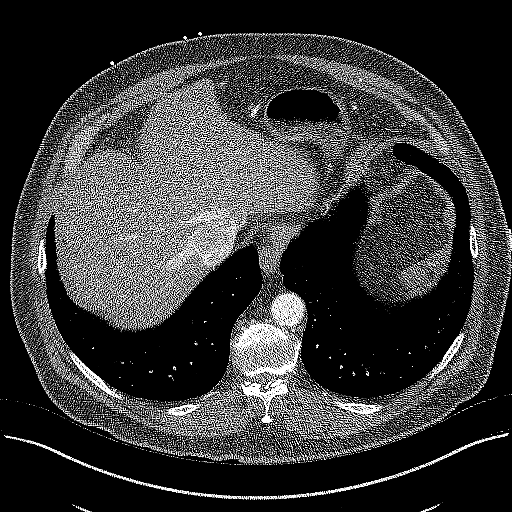

[Series 8: coronals · coronal · 0.73mm/px · 3 of 183 slices shown]
[im 46/183  soft-tissue]
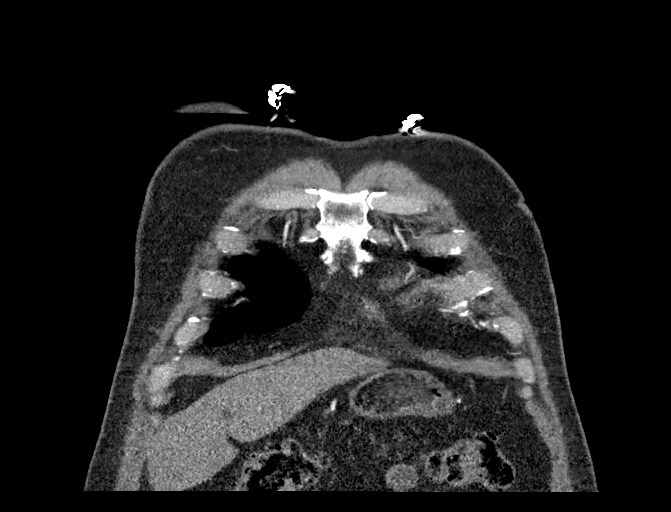
[im 92/183  soft-tissue]
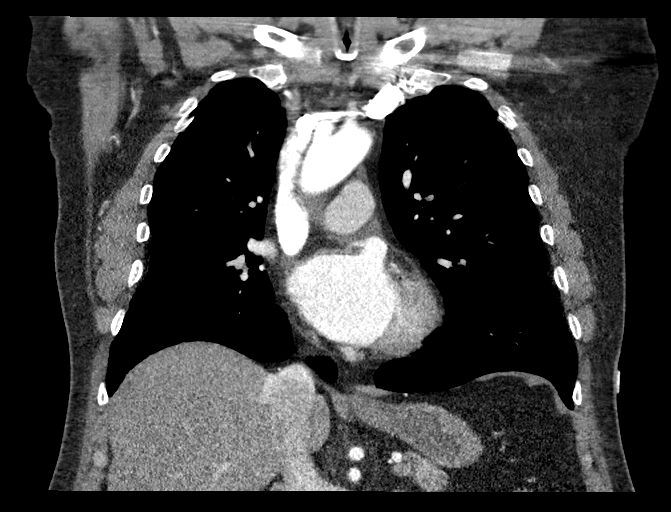
[im 137/183  soft-tissue]
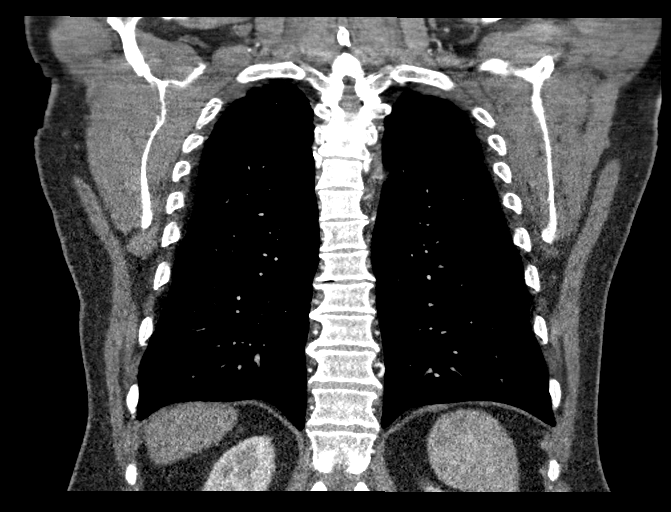

[18 of 46 positions shown; findings below may reference images not displayed]

FINDINGS: Cardiovascular: Normal heart size. No pericardial effusion. Aortic
tortuosity. No intramural hematoma or dissection of the aorta. No
aortic dissection. Atheromatous calcification of the coronary
arteries which is multifocal in the left and right circulation.
Essentially non-opacified pulmonary arteries.

Mediastinum/Nodes: Negative for adenopathy or mass.

Lungs/Pleura: There is no edema, consolidation, effusion, or
pneumothorax. Motion artifact at the apices.

Upper Abdomen: 2 calcified gallstones. No superimposed inflammation.

Musculoskeletal: No acute or aggressive finding. Thoracic
spondylosis

Review of the MIP images confirms the above findings.
IMPRESSION: 1. No evidence of acute aortic syndrome.
2. The chest x-ray appearance is related to aortic tortuosity rather
than aneurysm.
3. Multifocal coronary atherosclerotic calcification.
4. Cholelithiasis

## 2022-07-20 IMAGING — CR DG HIP (WITH OR WITHOUT PELVIS) 2-3V*L*
1 series · 3 of 3 positions shown · non-contrast
Comparison: None.

CLINICAL DATA: Left hip pain

EXAM:
DG HIP (WITH OR WITHOUT PELVIS) 2-3V LEFT

[Series 1: dg hip unilat w or w/o pelvis 2-3 views  · non-contrast · 0.14mm/px · 3 of 3 slices shown]
[im 1/3]
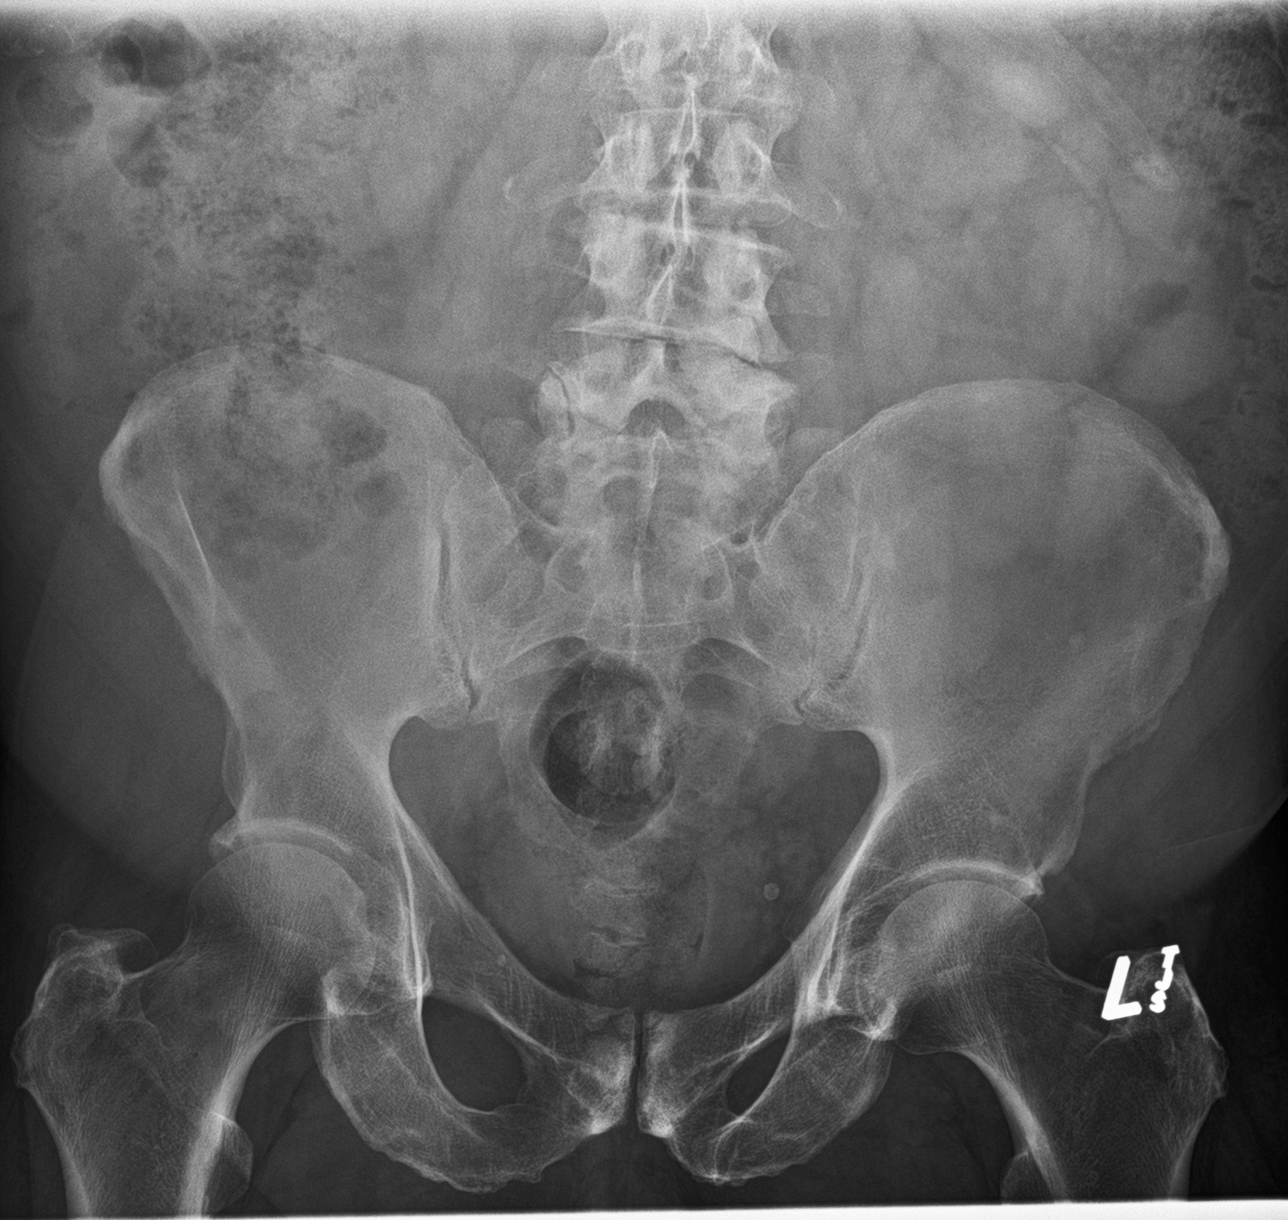
[im 2/3]
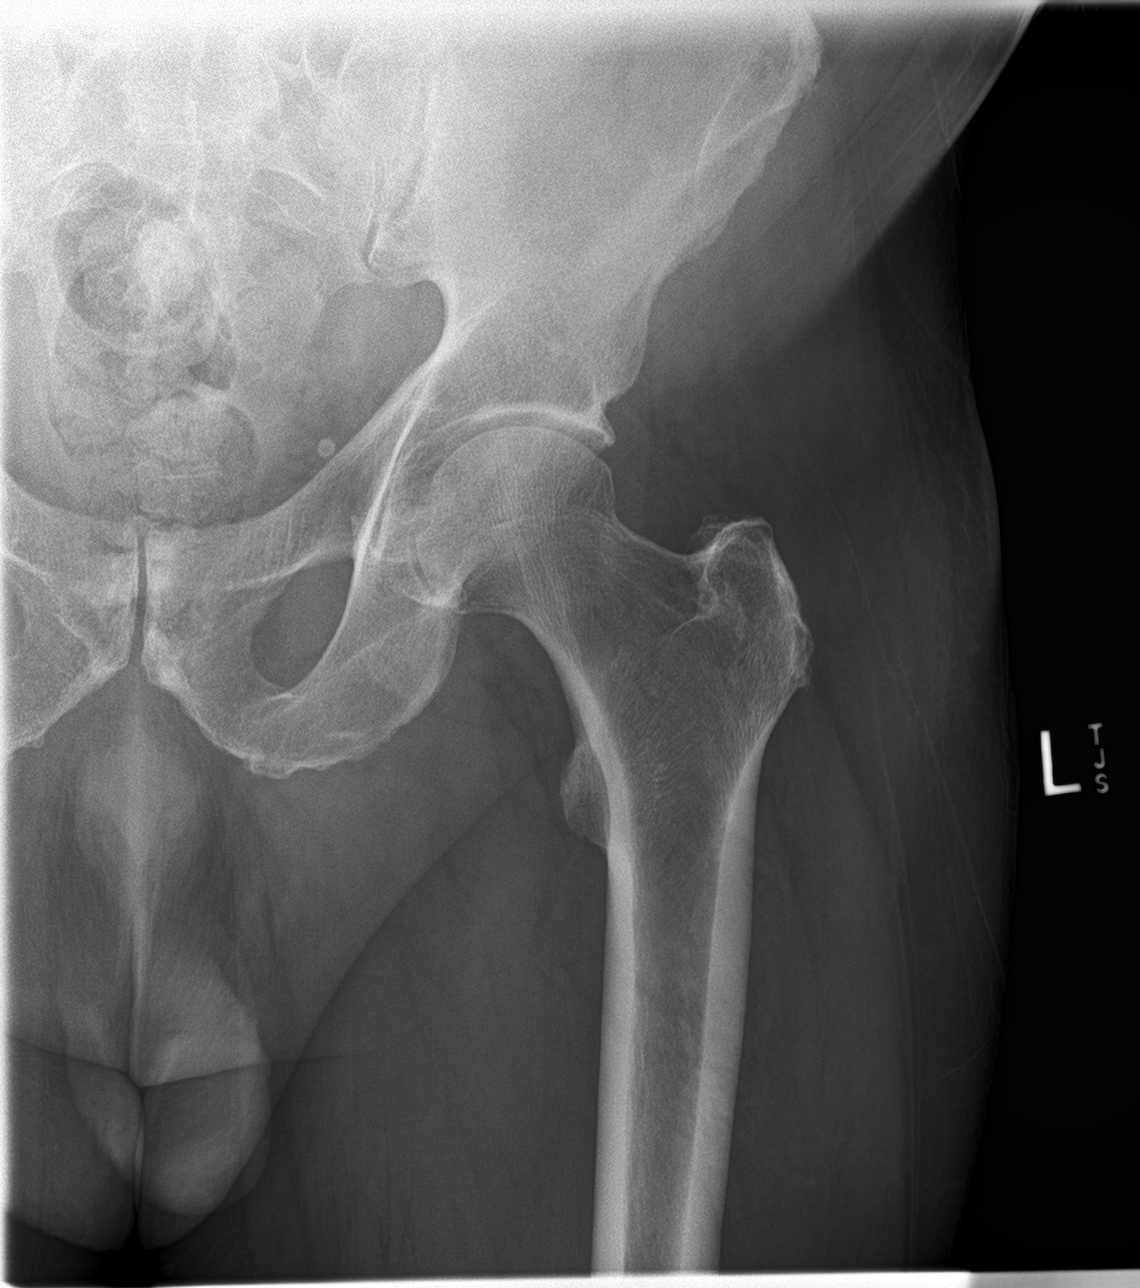
[im 3/3]
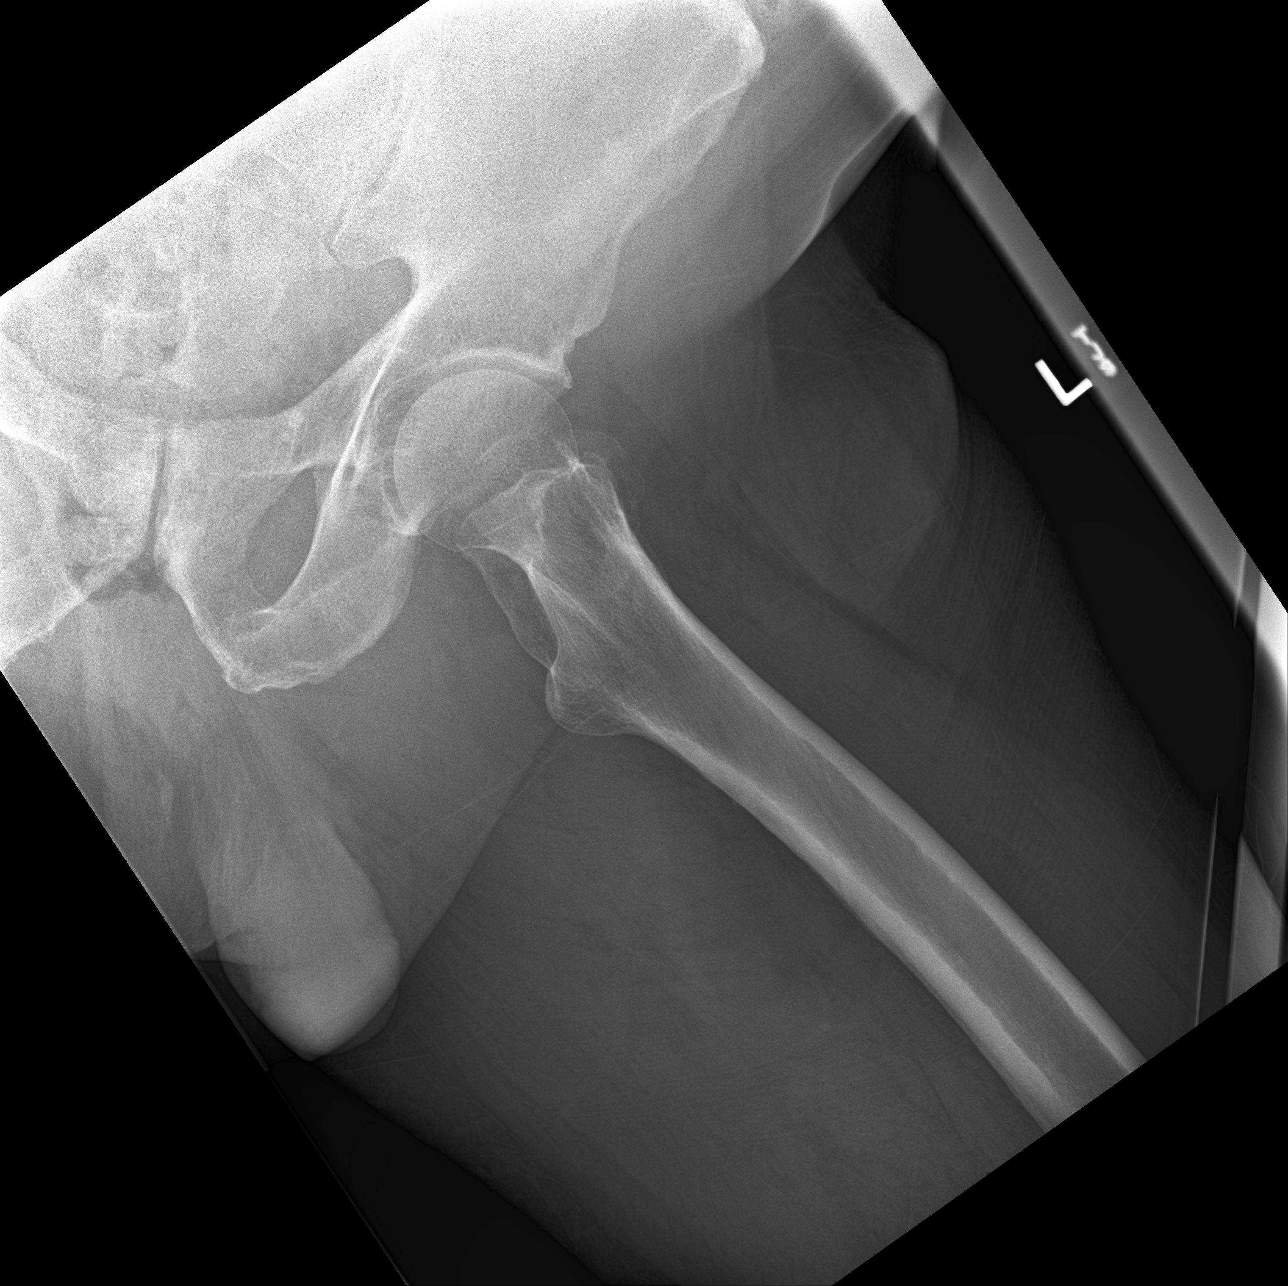

[3 of 3 positions shown; findings below may reference images not displayed]

FINDINGS: Alignment is anatomic. No acute fracture. Joint space is preserved.
No intrinsic osseous lesion.
IMPRESSION: Negative left hip.

## 2022-07-20 IMAGING — CR DG KNEE COMPLETE 4+V*L*
1 series · 4 of 4 positions shown · non-contrast
Comparison: None.

CLINICAL DATA: Pt states no injury, pain entire left knee for 30
years but worse in the last 3 years. No prior injury/surgery.knee
pain chronic.

EXAM:
LEFT KNEE - COMPLETE 4+ VIEW

[Series 1: dg knee complete 4 views left · 0.14mm/px · 4 of 4 slices shown]
[im 1/4]
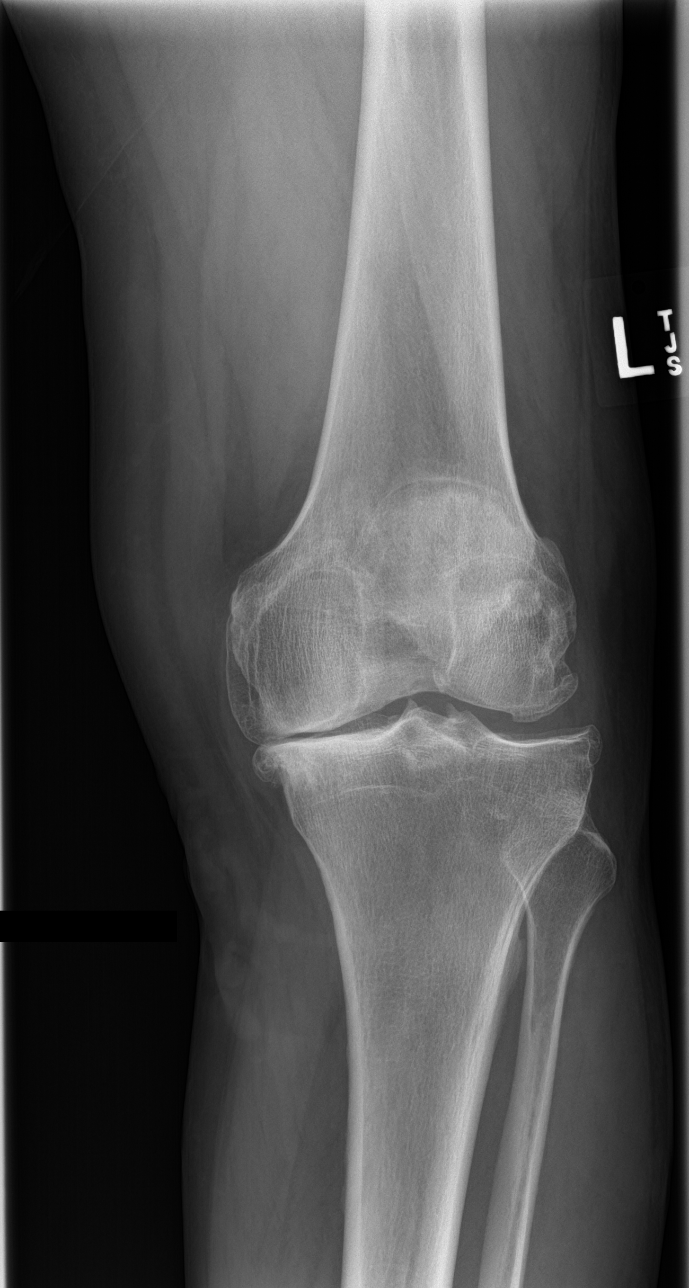
[im 2/4]
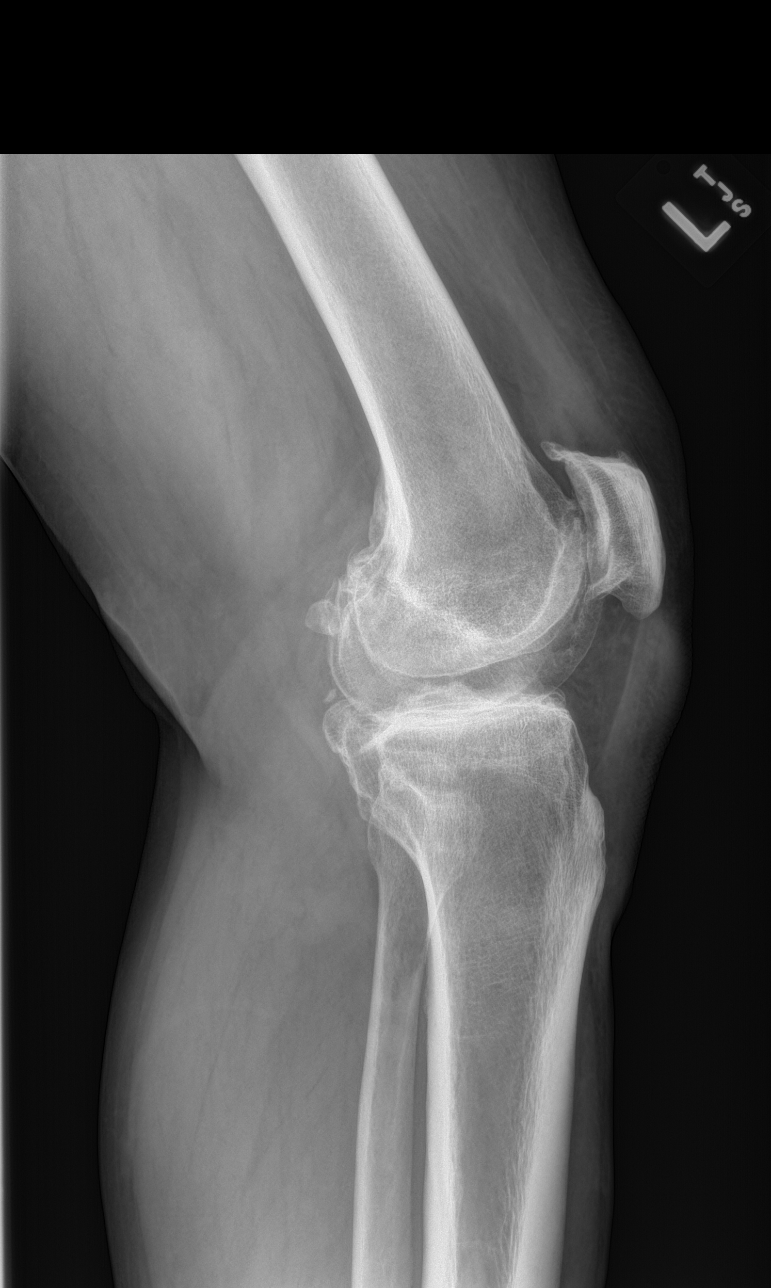
[im 3/4]
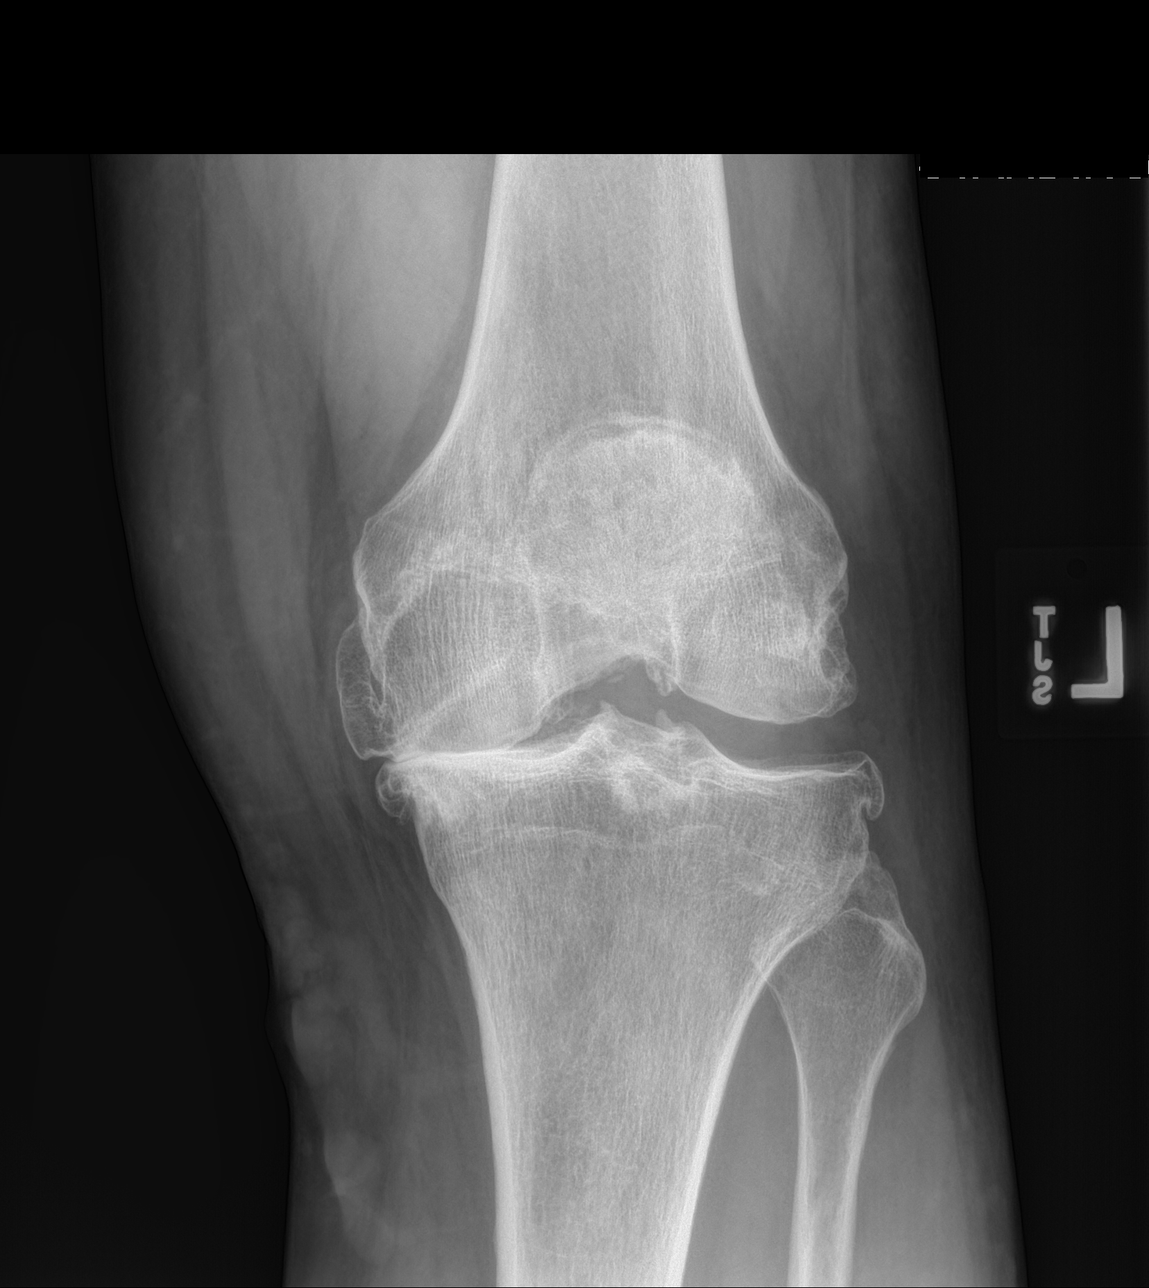
[im 4/4]
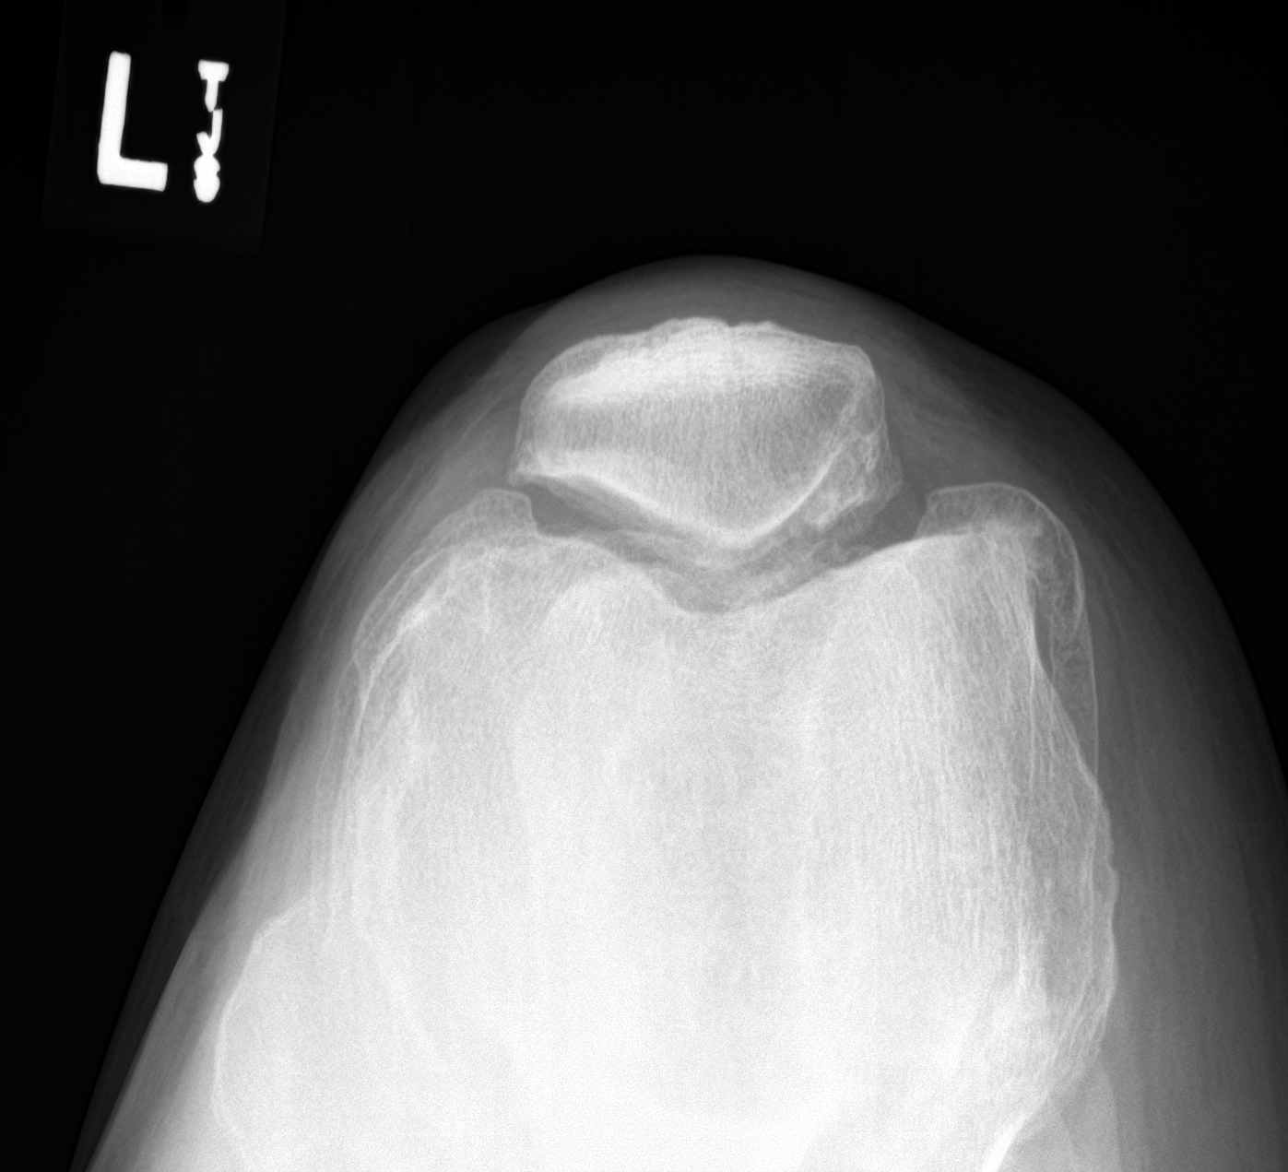

[4 of 4 positions shown; findings below may reference images not displayed]

FINDINGS: Severe joint space narrowing in the medial compartment with
associated sclerosis of the tibial plateau and lateral
osteophytosis. Osteophytosis of the patellofemoral compartment. No
joint effusion.
IMPRESSION: Tricompartment osteoarthritis most severe in the medial compartment

## 2022-11-24 ENCOUNTER — Encounter: Payer: Self-pay | Admitting: Physician Assistant

## 2022-11-24 ENCOUNTER — Telehealth: Payer: Self-pay | Admitting: Cardiology

## 2022-11-24 ENCOUNTER — Ambulatory Visit (INDEPENDENT_AMBULATORY_CARE_PROVIDER_SITE_OTHER): Payer: Medicare HMO | Admitting: Physician Assistant

## 2022-11-24 VITALS — BP 136/88 | HR 83 | Ht 70.0 in | Wt 249.6 lb

## 2022-11-24 DIAGNOSIS — M25569 Pain in unspecified knee: Secondary | ICD-10-CM

## 2022-11-24 DIAGNOSIS — M1990 Unspecified osteoarthritis, unspecified site: Secondary | ICD-10-CM

## 2022-11-24 DIAGNOSIS — I10 Essential (primary) hypertension: Secondary | ICD-10-CM

## 2022-11-24 DIAGNOSIS — H9190 Unspecified hearing loss, unspecified ear: Secondary | ICD-10-CM

## 2022-11-24 DIAGNOSIS — G8929 Other chronic pain: Secondary | ICD-10-CM

## 2022-11-24 DIAGNOSIS — B351 Tinea unguium: Secondary | ICD-10-CM

## 2022-11-24 DIAGNOSIS — E785 Hyperlipidemia, unspecified: Secondary | ICD-10-CM

## 2022-11-24 NOTE — Telephone Encounter (Signed)
Patient with diagnosis of afib on Eliquis for anticoagulation.    Procedure: cataract extraction by PE - IOL - Right with goniotomy  Date of procedure: 12/13/22   CHA2DS2-VASc Score = 4   This indicates a 4.8% annual risk of stroke. The patient's score is based upon: CHF History: 1 HTN History: 1 Diabetes History: 0 Stroke History: 0 Vascular Disease History: 1 Age Score: 1 Gender Score: 0      CrCl 134m/min  Anticoagulation typically does not need to be held for cataract surgery. If MD requires a hold, patient may hold Eliquis for 1 day prior to procedure.  **This guidance is not considered finalized until pre-operative APP has relayed final recommendations.**

## 2022-11-24 NOTE — Telephone Encounter (Signed)
   Patient Name: Matthew Jordan  DOB: [birth year] MRN: [id number]  Primary Cardiologist: None  Chart reviewed as part of pre-operative protocol coverage. Cataract extractions are recognized in guidelines as low risk surgeries that do not typically require specific preoperative testing or holding of blood thinner therapy. Therefore, given past medical history and time since last visit, based on ACC/AHA guidelines, DEZMOND DOWNIE would be at acceptable risk for the planned procedure without further cardiovascular testing.   Anticoagulation typically does not need to be held for cataract surgery. If MD requires a hold, patient may hold Eliquis for 1 day prior to procedure.   I will route this recommendation to the requesting party via Epic fax function and remove from pre-op pool.  Please call with questions.  Elgie Collard, PA-C 11/24/2022, 4:55 PM

## 2022-11-24 NOTE — Telephone Encounter (Signed)
   Pre-operative Risk Assessment    Patient Name: Matthew Jordan  DOB: Oct 28, 1951 MRN: [id number]      Request for Surgical Clearance    Procedure:   cataract extraction by PE - IOL - Right with goniotomy  Date of Surgery:  Clearance 12/13/22                                 Surgeon:  Dr Bretta Bang Surgeon's Group or Practice Name:  [facility] Phone number:  [phone] Fax number:  [phone]   Type of Clearance Requested:   - Pharmacy:  Hold Apixaban (Eliquis) instructions   Type of Anesthesia:  Not Indicated   Additional requests/questions:    Manfred Arch   11/24/2022, 4:17 PM

## 2022-11-24 NOTE — Progress Notes (Signed)
Established patient visit   Patient: Matthew Jordan   DOB: 07-Jan-1951   72 y.o. Male  MRN: [id number] Visit Date: 11/24/2022  Today's healthcare provider: Mardene Speak, PA-C   CC: knee pain and hearing loss  Subjective      Knee Pain: Patient presents with knee swelling involving the  left knee. Onset of the symptoms was  unclear . Inciting event: none known. Current symptoms include giving out and pain located in left knee . Pain is aggravated by any weight bearing.  Patient has had prior knee problems. Evaluation to date: none. Treatment to date: none.   Hearing Loss: Patient presents with hearing loss in both of his ears. The patient and daughter reports that it has been present for sometime.Associated symptoms were negative for tinnitus or vertigo . Onychomycosis: Patient complains of abnormally appearing toenails.  Symptoms have been ongoing for about several years, and include increasing thickness, darkening color, itching, unsightliness. Previous treatment has included none. Known liver disease? no.   Medications: Outpatient Medications Prior to Visit  Medication Sig   apixaban (ELIQUIS) 5 MG TABS tablet Take 1 tablet (5 mg total) by mouth 2 (two) times daily.   atorvastatin (LIPITOR) 40 MG tablet Take 1 tablet (40 mg total) by mouth daily.   benzonatate (TESSALON PERLES) 100 MG capsule Take 1-2 tabs TID prn cough (Patient not taking: Reported on 04/22/2022)   diltiazem (CARDIZEM CD) 240 MG 24 hr capsule Take 1 capsule (240 mg total) by mouth daily.   erythromycin ophthalmic ointment Place 1 application into both eyes 4 (four) times daily. ( 0.5 % erythromycin ointment).Apply to affected eye four times daily only thin layer on eyelashes. (Patient not taking: Reported on 04/22/2022)   furosemide (LASIX) 40 MG tablet Take 1 tablet (40 mg total) by mouth daily.   lisinopril-hydrochlorothiazide (ZESTORETIC) 10-12.5 MG tablet Take 1 tablet by mouth daily.   methocarbamol  (ROBAXIN) 500 MG tablet Take 500 mg by mouth 2 (two) times daily as needed.   ROCKLATAN 0.02-0.005 % SOLN Apply 1 drop to eye at bedtime.   traMADol (ULTRAM) 50 MG tablet Take 0.5-1 tablets (25-50 mg total) by mouth every 12 (twelve) hours as needed for moderate pain.   No facility-administered medications prior to visit.    Review of Systems  All other systems reviewed and are negative. Except see hpi     Objective    There were no vitals taken for this visit.   Physical Exam Vitals reviewed.  Constitutional:      General: He is not in acute distress.    Appearance: Normal appearance. He is not diaphoretic.  HENT:     Head: Normocephalic and atraumatic.     Right Ear: Tympanic membrane, ear canal and external ear normal.     Left Ear: Ear canal and external ear normal. There is impacted cerumen (partially).     Ears:     Comments: Positive finger rub test, bilaterally    Nose: Nose normal.  Eyes:     General: No scleral icterus.       Right eye: No discharge.        Left eye: No discharge.     Extraocular Movements: Extraocular movements intact.     Conjunctiva/sclera: Conjunctivae normal.     Pupils: Pupils are equal, round, and reactive to light.  Cardiovascular:     Rate and Rhythm: Normal rate and regular rhythm.     Pulses: Normal pulses.  Heart sounds: Normal heart sounds. No murmur heard. Pulmonary:     Effort: Pulmonary effort is normal. No respiratory distress.     Breath sounds: Normal breath sounds. No wheezing or rhonchi.  Musculoskeletal:        General: Swelling (left knee) and tenderness (left knee) present. Normal range of motion.     Cervical back: Normal range of motion and neck supple.     Right lower leg: No edema.     Left lower leg: No edema.     Comments: Negative anterior and posterior drawer tests. Anterior knee mild swelling, without ecchymosis, or erythema. Palpable effusion, without warmth, and tenderness with movements, weight  bearing. Valgus/varus stress test negative. Patella facet tenderness   Lymphadenopathy:     Cervical: No cervical adenopathy.  Skin:    General: Skin is warm and dry.     Findings: No rash.     Comments: Abnormally appearing toenails  Neurological:     Mental Status: He is alert and oriented to person, place, and time. Mental status is at baseline.     Gait: Gait abnormal.  Psychiatric:        Behavior: Behavior normal.        Thought Content: Thought content normal.        Judgment: Judgment normal.     No results found for any visits on 11/24/22.  Assessment & Plan     Left knee pain Acute In the setting of chronic knee pain, obesity and arthritis Advised protection, relative rest, ice, compression, elevation, medications, modalities: Acetaminophen: up to 3 g/day; safe and effective in OA Topical NSAIDs such as OTC Voltaren gel provide pain relief or Topical capsaicin. Referral was placed to orthopedics Taping, or bracing for stability advised   Hearing loss, unspecified hearing loss type, unspecified laterality Chronic No tinnitus or vertigo Referral to ENT was placed for evaluation Will fu with pt in 6 weeks.  Onychomycosis Chronic. Toenails. Recommended: Avoid factors that promote fungal growth (i.e., heat, moisture, occlusion, tight-fitting shoes). Avoid secondary infections. Ciclopirox: 8% nail lacquer (available generically): Apply once daily to affected nails  for up to 48 weeks; remove lacquer with alcohol every 7 days, and then file away loose nail material and trim nails.  Tavaborole 5% solution Any of the above solutions could be used depending on cost/affordability  Recommended regular Nail dbridement to remove infected keratin. Mechanical: File with abrasive stone or curette. Chemical: Protect peripheral tissue with adhesive strips; apply ointment of 28% salicylic acid, 76% urea, or 50% potassium iodide under occlusive dressing. Dbridement may be  combined with topical antifungal therapy.  Vicks VapoRub application to nails daily for 48 weeks  Referral to podiatry was placed  The patient was advised to call back or seek an in-person evaluation if the symptoms worsen or if the condition fails to improve as anticipated.  FU in 6 weeks after eye surgery. Of note, pt will need cardiology for his eye surgery  I discussed the assessment and treatment plan with the patient. The patient was provided an opportunity to ask questions and all were answered. The patient agreed with the plan and demonstrated an understanding of the instructions.  The entirety of the information documented in the History of Present Illness, Review of Systems and Physical Exam were personally obtained by me. Portions of this information were initially documented by the CMA and reviewed by me for thoroughness and accuracy.  Mardene Speak, [facility], [place] [phone] (phone) [phone] (fax)

## 2022-11-26 ENCOUNTER — Telehealth: Payer: Self-pay | Admitting: Cardiology

## 2022-11-26 NOTE — Telephone Encounter (Signed)
   Pre-operative Risk Assessment    Patient Name: Matthew Jordan  DOB: 1951-02-16 MRN: [id number]      Request for Surgical Clearance    Procedure:  Cataract Extraction by PE, IOL-Right w/GONIOTOMY   Date of Surgery:  Clearance 12/13/22                                 Surgeon:  Dr. Devin Going Surgeon's Group or Practice Name:  [facility] Phone number:[phone] Fax number:  [phone]   Type of Clearance Requested:  Hold Eliquis 2-3 days before surgery     Type of Anesthesia:  IV Sedation   Additional requests/questions:    Signed, Maxwell Caul   11/26/2022, 3:46 PM

## 2022-11-26 NOTE — Telephone Encounter (Signed)
Duplicate procedure request, see 1/10 phone note. Pt was cleared for 1 day Eliquis hold at that time. Looks like they are wanting longer hold. 2 day Eliquis hold would be ok as pt's CV risk off of anticoag is moderate.

## 2022-11-29 ENCOUNTER — Telehealth: Payer: Self-pay

## 2022-11-29 NOTE — Telephone Encounter (Signed)
Copied from [place] [id number]. Topic: Referral - Request for Referral >> Nov 29, 2022  2:52 PM Chapman Fitch wrote: Has patient seen PCP for this complaint? Yes Letitia Libra stated he needs referral  *If NO, is insurance requiring patient see PCP for this issue before PCP can refer them? Referral for which specialty: Cardiology  Preferred provider/office:  Reason for referral: AFIB

## 2022-11-29 NOTE — Telephone Encounter (Signed)
   Patient Name: Matthew Jordan  DOB: 1951-10-27 MRN: [id number]  Primary Cardiologist: None  Clinical pharmacists have reviewed the patient's past medical history, labs, and current medications as part of preoperative protocol coverage. The following recommendations have been made:   Per pharmacy 2 day Eliquis hold would be ok as pt's CV risk off of anticoag is moderate.    I will route this recommendation to the requesting party via Epic fax function and remove from pre-op pool.  Please call with questions.  Mable Fill, Marissa Nestle, NP 11/29/2022, 8:02 AM

## 2022-11-30 NOTE — Telephone Encounter (Signed)
LVMTCB. CRM created. Ok for PEC to advise 

## 2022-12-03 NOTE — Telephone Encounter (Signed)
LMTCB-Ok for PEC Nurse to advise 

## 2022-12-06 NOTE — Telephone Encounter (Signed)
LMTCB-Ok for PEC Nurse to advise 

## 2022-12-08 ENCOUNTER — Ambulatory Visit: Payer: Medicare HMO | Admitting: Medical

## 2022-12-21 ENCOUNTER — Ambulatory Visit: Payer: Medicare HMO | Admitting: Podiatry

## 2022-12-29 ENCOUNTER — Ambulatory Visit: Payer: Medicare HMO | Attending: Medical | Admitting: Cardiology

## 2022-12-29 ENCOUNTER — Encounter: Payer: Self-pay | Admitting: Cardiology

## 2022-12-29 VITALS — BP 110/78 | HR 67 | Ht 70.0 in | Wt 251.0 lb

## 2022-12-29 DIAGNOSIS — I2510 Atherosclerotic heart disease of native coronary artery without angina pectoris: Secondary | ICD-10-CM

## 2022-12-29 DIAGNOSIS — I10 Essential (primary) hypertension: Secondary | ICD-10-CM

## 2022-12-29 DIAGNOSIS — I480 Paroxysmal atrial fibrillation: Secondary | ICD-10-CM

## 2022-12-29 DIAGNOSIS — I5032 Chronic diastolic (congestive) heart failure: Secondary | ICD-10-CM

## 2022-12-29 MED ORDER — DILTIAZEM HCL ER COATED BEADS 240 MG PO CP24: 240.0000 mg | ORAL_CAPSULE | Freq: Every day | ORAL | 3 refills | Status: DC

## 2022-12-29 MED ORDER — LISINOPRIL-HYDROCHLOROTHIAZIDE 10-12.5 MG PO TABS: 1.0000 | ORAL_TABLET | Freq: Every day | ORAL | 3 refills | Status: DC

## 2022-12-29 MED ORDER — APIXABAN 5 MG PO TABS: 5.0000 mg | ORAL_TABLET | Freq: Two times a day (BID) | ORAL | 3 refills | Status: DC

## 2022-12-29 MED ORDER — FUROSEMIDE 40 MG PO TABS: 40.0000 mg | ORAL_TABLET | Freq: Every day | ORAL | 0 refills | Status: DC | PRN

## 2022-12-29 NOTE — Patient Instructions (Signed)
Medication Instructions:   Your physician recommends that you continue on your current medications as directed. Please refer to the Current Medication list given to you today.  *If you need a refill on your cardiac medications before your next appointment, please call your pharmacy*   Lab Work:  None Ordered  If you have labs (blood work) drawn today and your tests are completely normal, you will receive your results only by: [place] (if you have MyChart) OR A paper copy in the mail If you have any lab test that is abnormal or we need to change your treatment, we will call you to review the results.   Testing/Procedures:  None Ordered   Follow-Up: At [facility], you and your health needs are our priority.  As part of our continuing mission to provide you with exceptional heart care, we have created designated Provider Care Teams.  These Care Teams include your primary Cardiologist (physician) and Advanced Practice Providers (APPs -  Physician Assistants and Nurse Practitioners) who all work together to provide you with the care you need, when you need it.  We recommend signing up for the patient portal called "MyChart".  Sign up information is provided on this After Visit Summary.  MyChart is used to connect with patients for Virtual Visits (Telemedicine).  Patients are able to view lab/test results, encounter notes, upcoming appointments, etc.  Non-urgent messages can be sent to your provider as well.   To learn more about what you can do with MyChart, go to [email or web address].    Your next appointment:   3 month(s)  Provider:   You may see Kate Sable, MD or one of the following Advanced Practice Providers on your designated Care Team:   Murray Hodgkins, NP Christell Faith, PA-C Cadence Kathlen Mody, PA-C Gerrie Nordmann, NP

## 2022-12-29 NOTE — Progress Notes (Signed)
Cardiology Office Note:    Date:  12/29/2022   ID:  Matthew Jordan, DOB 14-Jul-1951, MRN [id number]  PCP:  Mardene Speak, PA-C   [facility] Providers Cardiologist:  Kate Sable, MD     Referring MD: Mardene Speak, PA-C   No chief complaint on file.   History of Present Illness:    Matthew Jordan is a 72 y.o. male with a hx of CAD (LAD and RCA calcifications on chest CT ), HFpEF, hypertension, paroxysmal atrial fibrillation who presents for follow-up.  Previously seen for HFpEF and paroxysmal atrial fibrillation.  States blood pressure is elevated usually in the evenings.  Takes Lasix as needed, has not taken Lasix in a while.  Has occasional shortness of breath.  Myoview showed no ischemia, last EF 60%.  Takes Eliquis 5 mg daily instead of twice daily.  Echo 09/2020 EF [id number], grade 1 diastolic dysfunction Myoview 11/22 no ischemia.  Past Medical History:  Diagnosis Date   Arthritis    BPH with urinary obstruction    CAD (coronary artery disease) Fall 2014   40% stenosis of LAD on cardiac cath   CAD (coronary artery disease)    Cardiac arrhythmia    Cardiac dysrhythmia    Chest pain    CHF (congestive heart failure) ([place])    Dysphagia    ED (erectile dysfunction)    Elevated PSA    GERD (gastroesophageal reflux disease)    Hypertension    Irregular heart beat    Left ventricular hypertrophy Fall 2014   mild concentric   Prostate finding    Sleep apnea    SOB (shortness of breath)    SOB (shortness of breath)     Past Surgical History:  Procedure Laterality Date   CARDIAC CATHETERIZATION  2014   [place]   HERNIA REPAIR      Current Medications: Current Meds  Medication Sig   ROCKLATAN 0.02-0.005 % SOLN Apply 1 drop to eye at bedtime.   tamsulosin (FLOMAX) 0.4 MG CAPS capsule Take 0.4 mg by mouth daily.   traMADol (ULTRAM) 50 MG tablet Take 0.5-1 tablets (25-50 mg total) by mouth every 12 (twelve) hours as needed for moderate pain.      Allergies:   Amoxicillin   Social History   Socioeconomic History   Marital status: Divorced    Spouse name: Not on file   Number of children: Not on file   Years of education: Not on file   Highest education level: Not on file  Occupational History   Not on file  Tobacco Use   Smoking status: Former    Packs/day: 0.25    Years: 10.00    Total pack years: 2.50    Types: Cigarettes    Quit date: 10/02/1980    Years since quitting: 42.2   Smokeless tobacco: Never  Vaping Use   Vaping Use: Never used  Substance and Sexual Activity   Alcohol use: Yes    Comment: weekly   Drug use: No   Sexual activity: Not on file  Other Topics Concern   Not on file  Social History Narrative   Not on file   Social Determinants of Health   Financial Resource Strain: Not on file  Food Insecurity: Not on file  Transportation Needs: Not on file  Physical Activity: Not on file  Stress: Not on file  Social Connections: Not on file     Family History: The patient's family history includes Cancer in his father; Hypertension  in his father and mother; Lung disease in his mother. There is no history of Kidney disease.  ROS:   Please see the history of present illness.     All other systems reviewed and are negative.  EKGs/Labs/Other Studies Reviewed:    The following studies were reviewed today:   EKG:  EKG is  ordered today.  The ekg ordered today demonstrates normal sinus rhythm.  Recent Labs: 02/11/2022: ALT 30; B Natriuretic Peptide 42.1; BUN 15; Creatinine, Ser 0.80; Hemoglobin 14.7; Platelets 192; Potassium 3.4; Sodium 140  Recent Lipid Panel    Component Value Date/Time   CHOL 110 10/17/2020 0850   TRIG 97 10/17/2020 0850   HDL 35 (L) 10/17/2020 0850   LDLCALC 56 10/17/2020 0850     Risk Assessment/Calculations:         Physical Exam:    VS:  BP 110/78   Pulse 67   Ht 5' 10"$  (1.778 m)   Wt 251 lb (113.9 kg)   BMI 36.01 kg/m     Wt Readings from Last 3  Encounters:  12/29/22 251 lb (113.9 kg)  11/24/22 249 lb 9.6 oz (113.2 kg)  04/22/22 254 lb 3.2 oz (115.3 kg)     GEN:  Well nourished, well developed in no acute distress HEENT: Normal NECK: No JVD; No carotid bruits CARDIAC: RRR, no murmurs, rubs, gallops RESPIRATORY:  Clear to auscultation without rales, wheezing or rhonchi  ABDOMEN: Soft, non-tender, non-distended MUSCULOSKELETAL:  No edema; No deformity  SKIN: Warm and dry NEUROLOGIC:  Alert and oriented x 3 PSYCHIATRIC:  Normal affect   ASSESSMENT:    1. Paroxysmal atrial fibrillation (HCC)   2. Coronary artery disease involving native heart, unspecified vessel or lesion type, unspecified whether angina present   3. Primary hypertension   4. Chronic heart failure with preserved ejection fraction (HCC)    PLAN:    In order of problems listed above:  Paroxysmal atrial fibrillation, currently in sinus rhythm.  CHA2DS2-VASc score 3 (chf, age, htn).  Continue Eliquis, Cardizem. LAD and RCA coronary calcification.  Myoview 11/22 no ischemia.  Echo 11/21 EF 65%.  Continue Eliquis, LDL 56. Hypertension, BP controlled.  Cardizem 240 mg, Zestoretic 1 tab daily. HFpEF, appears euvolemic, okay to continue Lasix as needed.  Monitor daily weights.  Graduated Exercising advised.  Consider repeat echo if shortness of breath persist.  Follow-up in 3 months.   Medication Adjustments/Labs and Tests Ordered: Current medicines are reviewed at length with the patient today.  Concerns regarding medicines are outlined above.  Orders Placed This Encounter  Procedures   EKG 12-Lead    Meds ordered this encounter  Medications   furosemide (LASIX) 40 MG tablet    Sig: Take 1 tablet (40 mg total) by mouth daily as needed.    Dispense:  30 tablet    Refill:  0   lisinopril-hydrochlorothiazide (ZESTORETIC) 10-12.5 MG tablet    Sig: Take 1 tablet by mouth daily.    Dispense:  90 tablet    Refill:  3   diltiazem (CARDIZEM CD) 240 MG 24 hr  capsule    Sig: Take 1 capsule (240 mg total) by mouth daily.    Dispense:  90 capsule    Refill:  3   apixaban (ELIQUIS) 5 MG TABS tablet    Sig: Take 1 tablet (5 mg total) by mouth 2 (two) times daily.    Dispense:  60 tablet    Refill:  3     Patient Instructions  Medication Instructions:   Your physician recommends that you continue on your current medications as directed. Please refer to the Current Medication list given to you today.  *If you need a refill on your cardiac medications before your next appointment, please call your pharmacy*   Lab Work:  None Ordered  If you have labs (blood work) drawn today and your tests are completely normal, you will receive your results only by: [place] (if you have MyChart) OR A paper copy in the mail If you have any lab test that is abnormal or we need to change your treatment, we will call you to review the results.   Testing/Procedures:  None Ordered   Follow-Up: At [facility], you and your health needs are our priority.  As part of our continuing mission to provide you with exceptional heart care, we have created designated Provider Care Teams.  These Care Teams include your primary Cardiologist (physician) and Advanced Practice Providers (APPs -  Physician Assistants and Nurse Practitioners) who all work together to provide you with the care you need, when you need it.  We recommend signing up for the patient portal called "MyChart".  Sign up information is provided on this After Visit Summary.  MyChart is used to connect with patients for Virtual Visits (Telemedicine).  Patients are able to view lab/test results, encounter notes, upcoming appointments, etc.  Non-urgent messages can be sent to your provider as well.   To learn more about what you can do with MyChart, go to [email or web address].    Your next appointment:   3 month(s)  Provider:   You may see Kate Sable, MD or one of the following  Advanced Practice Providers on your designated Care Team:   Murray Hodgkins, NP Christell Faith, PA-C Cadence Kathlen Mody, PA-C Gerrie Nordmann, NP    Signed, Kate Sable, MD  12/29/2022 11:41 AM    [place]

## 2023-01-03 ENCOUNTER — Telehealth: Payer: Self-pay | Admitting: Physician Assistant

## 2023-01-03 NOTE — Telephone Encounter (Signed)
Medication Refill - Medication: tamsulosin (FLOMAX) 0.4 MG CAPS capsule   Has the patient contacted their pharmacy? Yes.    (Agent: If yes, when and what did the pharmacy advise?) To contact provider. Pharmacy said they needed more information.   Preferred Pharmacy (with phone number or street name): [id number] [place] [address]   Has the patient been seen for an appointment in the last year OR does the patient have an upcoming appointment? Yes.    Agent: Please be advised that RX refills may take up to 3 business days. We ask that you follow-up with your pharmacy.   Please follow up with patient's daughter.

## 2023-01-04 ENCOUNTER — Encounter: Payer: Self-pay | Admitting: Podiatry

## 2023-01-04 ENCOUNTER — Other Ambulatory Visit: Payer: Self-pay | Admitting: Physician Assistant

## 2023-01-04 ENCOUNTER — Encounter: Payer: Self-pay | Admitting: Physician Assistant

## 2023-01-04 ENCOUNTER — Ambulatory Visit: Payer: Medicare HMO | Admitting: Podiatry

## 2023-01-04 VITALS — BP 192/145 | HR 69

## 2023-01-04 DIAGNOSIS — B351 Tinea unguium: Secondary | ICD-10-CM

## 2023-01-04 DIAGNOSIS — M79675 Pain in left toe(s): Secondary | ICD-10-CM

## 2023-01-04 DIAGNOSIS — M79674 Pain in right toe(s): Secondary | ICD-10-CM

## 2023-01-04 DIAGNOSIS — B353 Tinea pedis: Secondary | ICD-10-CM

## 2023-01-04 MED ORDER — TAMSULOSIN HCL 0.4 MG PO CAPS: 0.4000 mg | ORAL_CAPSULE | Freq: Every day | ORAL | 0 refills | Status: DC

## 2023-01-04 MED ORDER — CLOTRIMAZOLE-BETAMETHASONE 1-0.05 % EX CREA: 1.0000 | TOPICAL_CREAM | Freq: Two times a day (BID) | CUTANEOUS | 3 refills | Status: DC

## 2023-01-04 MED ORDER — TERBINAFINE HCL 250 MG PO TABS: 250.0000 mg | ORAL_TABLET | Freq: Every day | ORAL | 0 refills | Status: DC

## 2023-01-04 NOTE — Telephone Encounter (Signed)
Pt scheduled for 2/22 for referral and would like the tamsulosin called in until they are able to get into Urology.

## 2023-01-04 NOTE — Progress Notes (Signed)
   Chief Complaint  Patient presents with   Nail Problem    "His toenails have fungus." N - fungus L - bilateral 1-5 D - 60 yrs O - gradually worse  C - painful, thick, discolored A - walking T - OTC fungus medicine    Subjective: 72 y.o. male presenting today as a new patient with his daughter for evaluation of painful thick discolored toenails 1-5 bilateral.  This is been present for 50 years according to the patient.  They have tried OTC topical antifungal with minimal improvement.  Presenting for further treatment evaluation  Past Medical History:  Diagnosis Date   Arthritis    BPH with urinary obstruction    CAD (coronary artery disease) Fall 2014   40% stenosis of LAD on cardiac cath   CAD (coronary artery disease)    Cardiac arrhythmia    Cardiac dysrhythmia    Chest pain    CHF (congestive heart failure) (HCC)    Dysphagia    ED (erectile dysfunction)    Elevated PSA    GERD (gastroesophageal reflux disease)    Hypertension    Irregular heart beat    Left ventricular hypertrophy Fall 2014   mild concentric   Prostate finding    Sleep apnea    SOB (shortness of breath)    SOB (shortness of breath)     Past Surgical History:  Procedure Laterality Date   CARDIAC CATHETERIZATION  2014   [place]   HERNIA REPAIR      Allergies  Allergen Reactions   Amoxicillin Other (See Comments)    Objective: Physical Exam General: The patient is alert and oriented x3 in no acute distress.  Dermatology: Hyperkeratotic, discolored, thickened, onychodystrophy noted bilateral toenails. Skin is warm, dry and supple bilateral lower extremities. Negative for open lesions or macerations.  There is some diffuse hyperkeratosis of skin consistent with a chronic tinea pedis  Vascular: Palpable pedal pulses bilaterally. No edema or erythema noted. Capillary refill within normal limits.  Neurological: Epicritic and protective threshold grossly intact bilaterally.    Musculoskeletal Exam: No pedal deformity noted  Assessment: #1 Onychomycosis of toenails #2 tinea pedis both feet  Plan of Care:  #1 Patient was evaluated. #2  Today we discussed different treatment options including oral, topical, and laser antifungal treatment modalities.  We discussed their efficacies and side effects.  Patient opts for oral antifungal treatment modality #3 prescription for Lamisil 250 mg #90 daily. Pt denies a history of liver pathology or symptoms.  Patient is otherwise healthy for the patient's age.  Comprehensive metabolic panel 123456 and prior metabolic panels LFTs WNL. #4  Prescription for Lotrisone cream apply 2 times daily to the feet #5 return to clinic 6 months   Edrick Kins, DPM [facility]  Dr. Edrick Kins, DPM    [address], [place] [address]                Office [phone]  Fax [phone]

## 2023-01-05 ENCOUNTER — Telehealth: Payer: Self-pay

## 2023-01-05 NOTE — Telephone Encounter (Signed)
Copied from [place] [id number]. Topic: Referral - Request for Referral >> Jan 05, 2023 10:12 AM Leone Payor F wrote: Has patient seen PCP for this complaint? Yes.   *If NO, is insurance requiring patient see PCP for this issue before PCP can refer them? Referral for which specialty: Dermatology  Preferred provider/office: n/a Reason for referral: Patient has a lot of itching around his eyelids and ears.

## 2023-01-06 ENCOUNTER — Ambulatory Visit: Payer: Medicare HMO | Admitting: Physician Assistant

## 2023-01-06 NOTE — Progress Notes (Deleted)
    Argentina Ponder Larya Charpentier,acting as a Education administrator for [employer], PA-C.,have documented all relevant documentation on the behalf of Mardene Speak, PA-C,as directed by  [employer], PA-C while in the presence of [employer], PA-C.    Established patient visit   Patient: Matthew Jordan   DOB: [birth year]   72 y.o. Male  MRN: [id number] Visit Date: 01/06/2023  Today's healthcare provider: Mardene Speak, PA-C   No chief complaint on file.  Subjective    HPI  Patient is a 72 year old male who presents with request to have referral to urology  Medications: Outpatient Medications Prior to Visit  Medication Sig   apixaban (ELIQUIS) 5 MG TABS tablet Take 1 tablet (5 mg total) by mouth 2 (two) times daily.   clotrimazole-betamethasone (LOTRISONE) cream Apply 1 Application topically 2 (two) times daily.   diltiazem (CARDIZEM CD) 240 MG 24 hr capsule Take 1 capsule (240 mg total) by mouth daily.   furosemide (LASIX) 40 MG tablet Take 1 tablet (40 mg total) by mouth daily as needed.   lisinopril-hydrochlorothiazide (ZESTORETIC) 10-12.5 MG tablet Take 1 tablet by mouth daily.   ROCKLATAN 0.02-0.005 % SOLN Apply 1 drop to eye at bedtime.   tamsulosin (FLOMAX) 0.4 MG CAPS capsule Take 1 capsule (0.4 mg total) by mouth daily.   terbinafine (LAMISIL) 250 MG tablet Take 1 tablet (250 mg total) by mouth daily.   traMADol (ULTRAM) 50 MG tablet Take 0.5-1 tablets (25-50 mg total) by mouth every 12 (twelve) hours as needed for moderate pain. (Patient not taking: Reported on 01/04/2023)   No facility-administered medications prior to visit.    Review of Systems  {Labs  Heme  Chem  Endocrine  Serology  Results Review (optional):23779}   Objective    There were no vitals taken for this visit. {Show previous vital signs (optional):23777}  Physical Exam  ***  No results found for any visits on 01/06/23.  Assessment & Plan     ***  No follow-ups on file.      {provider  attestation***:1}   Mardene Speak, PA-C  [place] [phone] (phone) [phone] (fax)  [place]

## 2023-01-07 ENCOUNTER — Ambulatory Visit (INDEPENDENT_AMBULATORY_CARE_PROVIDER_SITE_OTHER): Payer: Medicare HMO | Admitting: Physician Assistant

## 2023-01-07 VITALS — BP 135/75 | HR 75 | Temp 97.7°F | Ht 70.0 in | Wt 250.5 lb

## 2023-01-07 DIAGNOSIS — N401 Enlarged prostate with lower urinary tract symptoms: Secondary | ICD-10-CM

## 2023-01-07 DIAGNOSIS — N138 Other obstructive and reflux uropathy: Secondary | ICD-10-CM

## 2023-01-07 DIAGNOSIS — Z125 Encounter for screening for malignant neoplasm of prostate: Secondary | ICD-10-CM

## 2023-01-07 DIAGNOSIS — J329 Chronic sinusitis, unspecified: Secondary | ICD-10-CM | POA: Diagnosis not present

## 2023-01-07 NOTE — Progress Notes (Unsigned)
Established patient visit   Patient: Matthew Jordan   DOB: [birth year]   72 y.o. Male  MRN: [id number] Visit Date: 01/07/2023  Today's healthcare provider: Mardene Speak, PA-C   Chief Complaint  Patient presents with   Rash    Itching around eyes and ears >2 years - referral to derm   Subjective     HPI Pt presents with two complaints today Rash and Urinary problems  Rash Pt has been having itchy eyes and ears and believes that he needs to be seen by dermatology for an assessment.    Urinary symptoms Pt has been having problems with urinary frequency and urgency. He endorses having pain /pressure during urination . Pt was diagnosed with BPH and was prescribed Tamsulosin by Cardiology.   Medications: Outpatient Medications Prior to Visit  Medication Sig   apixaban (ELIQUIS) 5 MG TABS tablet Take 1 tablet (5 mg total) by mouth 2 (two) times daily.   clotrimazole-betamethasone (LOTRISONE) cream Apply 1 Application topically 2 (two) times daily.   diltiazem (CARDIZEM CD) 240 MG 24 hr capsule Take 1 capsule (240 mg total) by mouth daily.   furosemide (LASIX) 40 MG tablet Take 1 tablet (40 mg total) by mouth daily as needed.   lisinopril-hydrochlorothiazide (ZESTORETIC) 10-12.5 MG tablet Take 1 tablet by mouth daily.   ROCKLATAN 0.02-0.005 % SOLN Apply 1 drop to eye at bedtime.   tamsulosin (FLOMAX) 0.4 MG CAPS capsule Take 1 capsule (0.4 mg total) by mouth daily.   terbinafine (LAMISIL) 250 MG tablet Take 1 tablet (250 mg total) by mouth daily.   traMADol (ULTRAM) 50 MG tablet Take 0.5-1 tablets (25-50 mg total) by mouth every 12 (twelve) hours as needed for moderate pain. (Patient not taking: Reported on 01/04/2023)   No facility-administered medications prior to visit.    ROS is negative Except see hpi    Objective    BP 135/75 (BP Location: Left Arm)   Pulse 75   Temp 97.7 F (36.5 C)   Ht '5\' 10"'$  (1.778 m)   Wt 250 lb 8 oz (113.6 kg)   SpO2 97%   BMI 35.94  kg/m    Physical Exam Vitals reviewed.  Constitutional:      General: He is in acute distress.     Appearance: Normal appearance. He is not ill-appearing, toxic-appearing or diaphoretic.  HENT:     Head: Normocephalic and atraumatic.     Right Ear: External ear normal. There is impacted cerumen (partially).     Left Ear: Tympanic membrane, ear canal and external ear normal.     Nose: Congestion and rhinorrhea present.     Mouth/Throat:     Pharynx: Posterior oropharyngeal erythema present.  Eyes:     General: No scleral icterus.       Right eye: No discharge.        Left eye: No discharge.     Extraocular Movements: Extraocular movements intact.     Conjunctiva/sclera: Conjunctivae normal.     Pupils: Pupils are equal, round, and reactive to light.  Cardiovascular:     Rate and Rhythm: Normal rate and regular rhythm.     Pulses: Normal pulses.     Heart sounds: Normal heart sounds. No murmur heard. Pulmonary:     Effort: Pulmonary effort is normal. No respiratory distress.     Breath sounds: Normal breath sounds. No wheezing or rhonchi.  Abdominal:     General: Abdomen is flat. Bowel sounds are normal.  Palpations: Abdomen is soft.  Musculoskeletal:        General: Normal range of motion.     Cervical back: Normal range of motion and neck supple.     Right lower leg: No edema.     Left lower leg: No edema.  Lymphadenopathy:     Cervical: No cervical adenopathy.  Skin:    General: Skin is warm and dry.     Findings: No rash.  Neurological:     General: No focal deficit present.     Mental Status: He is alert and oriented to person, place, and time. Mental status is at baseline.  Psychiatric:        Behavior: Behavior normal.        Thought Content: Thought content normal.      No results found for any visits on 01/07/23.  Assessment & Plan     1. Screening for prostate cancer Last PSA was done on 10/17/20, was 7.4 Needs to update labs: - PSA Total (Reflex  To Free) Will refer to Urology for an evaluation Will FU  2. 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 Was not able to complete POC UA . Will  refer to Urology for an evaluation   3. Rhinosinusitis Sinusitis  symptoms and exam c/w sinusitis   - no evidence of AOM, CAP, strep pharyngitis, or other infection - given duration of symptoms, suspect allergic etiology - explained  treatment options that will help with symptoms : symptomatic management (flonase, decongestants, nasal saline rinse/sprayetc) - discussed concerning symptoms like high fever, SOB, facial pain. The patient was advised to call back or seek an in-person evaluation if the symptoms worsen or if the condition fails to improve as anticipated.    Return in about 6 weeks (around 02/18/2023) for chronic disease f/u.      The patient was advised to call back or seek an in-person evaluation if the symptoms worsen or if the condition fails to improve as anticipated.  I discussed the assessment and treatment plan with the patient. The patient was provided an opportunity to ask questions and all were answered. The patient agreed with the plan and demonstrated an understanding of the instructions.  I, Mardene Speak, PA-C have reviewed all documentation for this visit. The documentation on  01/07/23 for the exam, diagnosis, procedures, and orders are all accurate and complete.  Mardene Speak, [facility], [employer] [phone] (phone) [phone] (fax)   [place]

## 2023-01-08 LAB — PSA TOTAL (REFLEX TO FREE): Prostate Specific Ag, Serum: 8.6 ng/mL — ABNORMAL HIGH (ref 0.0–4.0)

## 2023-01-08 LAB — FPSA% REFLEX
% FREE PSA: 19.7 %
PSA, FREE: 1.69 ng/mL

## 2023-01-09 ENCOUNTER — Encounter: Payer: Self-pay | Admitting: Physician Assistant

## 2023-01-11 NOTE — Progress Notes (Signed)
Please let patient know that his PSA was elevated, 8.6 versus 7.42 years ago Our scheduler will contact him for urology referral

## 2023-01-14 HISTORY — PX: EYE SURGERY: SHX253

## 2023-01-19 ENCOUNTER — Ambulatory Visit (INDEPENDENT_AMBULATORY_CARE_PROVIDER_SITE_OTHER): Payer: Medicare HMO | Admitting: Urology

## 2023-01-19 ENCOUNTER — Encounter: Payer: Self-pay | Admitting: Urology

## 2023-01-19 ENCOUNTER — Ambulatory Visit: Payer: Medicare HMO | Admitting: Urology

## 2023-01-19 VITALS — BP 132/78 | HR 64 | Ht 70.0 in | Wt 251.0 lb

## 2023-01-19 DIAGNOSIS — N529 Male erectile dysfunction, unspecified: Secondary | ICD-10-CM | POA: Diagnosis not present

## 2023-01-19 DIAGNOSIS — R972 Elevated prostate specific antigen [PSA]: Secondary | ICD-10-CM | POA: Diagnosis not present

## 2023-01-19 DIAGNOSIS — R399 Unspecified symptoms and signs involving the genitourinary system: Secondary | ICD-10-CM | POA: Diagnosis not present

## 2023-01-19 MED ORDER — TADALAFIL 5 MG PO TABS: 5.0000 mg | ORAL_TABLET | Freq: Every day | ORAL | 6 refills | Status: DC | PRN

## 2023-01-19 NOTE — Patient Instructions (Signed)
Enucleacin de la prstata con lser de Holmio (HoLEP)  Holmium Laser Enucleation of the Prostate (HoLEP)  La HoLEP es un tratamiento para los hombres con hiperplasia prosttica benigna (BPH, por sus siglas en ingls). La intervencin con lser elimina las obstrucciones del flujo de la [place] y se hace sin ninguna incisin en el cuerpo.  Qu es la HoLEP?  La HoLEP es un tipo de intervencin con lser que se [place] para tratar la obstruccin (bloqueo) del flujo de la orina como resultado de la hiperplasia prosttica benigna (BPH). En los hombres con BPH, la glndula prosttica no es cancerosa, [place] se ha aumentado de [place]. El aumento de tamao de la prstata puede provocar una serie de sntomas en las vas urinarias, tales como un chorro de orina escaso, dificultad para Art gallery manager a Garment/textile technologist, incapacidad para Garment/textile technologist, Garment/textile technologist con frecuencia o levantarse por la noche para Garment/textile technologist.  La HoLEP se desarroll en la dcada de los 1990's como una opcin quirrgica ms eficaz y menos costosa para la BPH, en comparacin con otras opciones quirrgicas tales como la vaporizacin (PVP/lser de luz verde), la extirpacin transuretral de la prstata (TURP) y la prostatectoma abierta simple.  Qu ocurre durante una intervencin de HoLEP?   La HoLEP requiere anestesia general (el paciente est "dormido" durante el procedimiento).   Se le da un antibitico para reducir el riesgo de infeccin.   Se introduce un instrumento quirrgico llamado resectoscopio a travs de la uretra (el conducto que lleva la orina desde la vejiga). El resectoscopio tiene una cmara que le permite al cirujano ver la estructura interna de la glndula prosttica y ver por dnde se hacen las incisiones durante la [place].   El lser se inserta en el resectoscopio y se [place] para Economist (separar) el tejido prosttico agrandado de la cpsula (cubierta exterior) y luego sellar los vasos sanguneos. El tejido extirpado se introduce en la  vejiga.   Se coloca un morcelador a travs del resectoscopio y se [place] para succionar el tejido prosttico que se ha introducido en la vejiga.   Una vez extrado el tejido de la prstata, se retira el resectoscopio y se coloca una sonda de [place] para facilitar la sanacin y drenar la orina de la vejiga.  Qu ocurre despus de una intervencin de HoLEP?  Ms del 90% de los pacientes se Lucianne Lei a casa el mismo da, unas horas despus de la operacin. Menos del 10% ingresarn al hospital por una noche, para observacin y monitoreo de la [place], o si tienen otros problemas mdicos.   Se har un lavado del catter con suero fisiolgico aproximadamente por una hora despus de la operacin para eliminar la sangre de la [place]. Es normal que haya algo de sangre en la orina despus de la intervencin. La necesidad de una transfusin de sangre es extremadamente rara.   Una vez que el paciente se haya despertado completamente de la anestesia, se le permite comer y tomar lquidos.   El catter [employer] se retira despus de 2-3 das de la operacin; el paciente ir a la clnica para que le retiren Print production planner y se asegure que puede orinar por s mismo.   Es muy importante tomar muchos lquidos despus de la intervencin por una semana para mantener la vejiga limpia.   Al principio puede haber algo de ardor al Garment/textile technologist, pero esto generalmente mejora en unas horas o das. La mayora de los pacientes no presentan un dolor intenso, y rara vez se necesitan analgsicos narcticos.  Los sntomas de Garment/textile technologist con frecuencia, con urgencia e incluso de un goteo son NORMALES durante las primeras semanas despus de la ciruga, ya que la vejiga se adapta despus de haber tenido que trabajar contra el bloqueo de la prstata durante muchos aos. Esto mejorar, pero a veces puede tomar varios meses.   El uso de ejercicios del suelo plvico (ejercicios de Kegel) pueden ayudar a mejorar los problemas de incontinencia  urinaria.   Despus de [employer], los pacientes sern examinados a las 6 semanas y a los 6 meses para revisar los sntomas.   No se deben levantar objetos pesados por lo menos durante 2-3 semanas despus de la intervencin, sin embargo los pacientes pueden caminar y Airline pilot despus de la intervencin. El regreso al trabajo depende de la ocupacin que tenga.  Cules son las ventajas de la intervencin de HoLEP?   La HoLEP ha sido estudiado en muchas partes diferentes del mundo y ha demostrado ser un procedimiento seguro y Armed forces logistics/support/administrative officer. Aunque hay muchos tipos de operaciones de BPH disponibles, la HoLEP ofrece una ventaja nica al poder extraer Ardelia Mems gran cantidad de tejido sin ninguna incisin en el cuerpo, incluso en prstatas muy grandes, al [employer] el riesgo de hemorragia y proporciona tejido para Public affairs consultant (para Museum/gallery conservator). Esto disminuye la necesidad de transfusiones de sangre durante la intervencin, minimiza la estada en el hospital y reduce el riesgo de tener que repetir el tratamiento.  Cules son los efectos secundarios de la intervencin de HoLEP?   Ardor y sangrado temporal al [employer]. Puede verse algo de Biochemist, clinical en la orina durante algunas semanas despus de la intervencin y es parte del proceso de sanacin.   Es de Photographer que todos los pacientes presenten incontinencia urinaria (incapacidad para controlar el flujo de la orina) inmediatamente despus de la intervencin y debern usar protectores durante los primeros das/semanas. Esto generalmente mejora en el transcurso de varias semanas. La prctica de los ejercicios Kegel puede ayudar a disminuir el goteo por movimientos de esfuerzo como toser, estornudar o Surveyor, quantity. El porcentaje de pacientes con goteo a largo plazo es [place]. Los pacientes tambin pueden presentar goteos con [place] y esto puede tratarse con medicamentos. El riesgo de incontinencia con urgencia  puede depender de varios factores como la edad, el tamao de la prstata, los sntomas y otros problemas mdicos.   Eyaculacin retrgrada o "eyaculacin hacia atrs." En el 75% de los casos, el paciente no ver ningn lquido durante la eyaculacin despus de la intervencin.   La funcin erctil no suele verse afectada de forma considerable.  Cules son los riesgos de la intervencin de HoLEP?   Lesin de la uretra o desarrollo de tejido de Development worker, community en el futuro   Lesin de la cpsula de la prstata (normalmente tratada con un cateterismo ms largo).   Lesin de la vejiga o de los orificios ureterales (por donde Corporate treasurer orina del rin)   Infeccin de la vejiga, los testculos o los riones   Reaparicin de la obstruccin urinaria posteriormente que requiere otra intervencin (<2%)   Necesidad de transfusin de [place] u otra intervencin debido a una hemorragia   Falta de alivio de todos los sntomas y/o necesidad de cateterismo prolongado despus de la  ciruga   En un 5-15% de los pacientes se Pension scheme manager un cncer de prstata no diagnosticado  previamente en la [place]. El cncer de prstata puede tratarse despus de la HoLEP.   Riesgos  habituales de la anestesia, como cogulos de [place], [place], Social research officer, government.  Cundo debo llamar a mi mdico?   Si presenta fiebre de ms de 101.3 grados   Incapacidad para Garment/textile technologist, o grandes cogulos de sangre en la orina  Hiperplasia prosttica benigna Benign Prostatic Hyperplasia  La hiperplasia prosttica benigna (HBP) es un aumento del tamao de la prstata que es causado por el proceso normal de envejecimiento. La prstata puede aumentar de tamao a medida que un hombre envejece. Esta afeccin no es causada por Science writer. La prstata es una glndula del tamao de una nuez que participa en la produccin de semen. Est ubicada frente al recto y debajo de la vejiga. La vejiga almacena la orina. La uretra lleva la orina hacia  fuera del cuerpo. Una prstata agrandada puede presionar la uretra. Esto puede dificultar el pasaje de la orina. La acumulacin de orina en la vejiga puede causar una infeccin. La presin y la infeccin pueden provocar daos en la vejiga y una insuficiencia en los riones (renal). Cules son las causas? Esta afeccin es parte del proceso normal de envejecimiento. Sin embargo, no todos los hombres desarrollan problemas por esta afeccin. Si la prstata se agranda lejos de la uretra, el flujo de orina no se obstruir. Si se agranda hacia la uretra y la comprime, habr problemas con el paso de la [place]. Qu incrementa el riesgo? Es ms probable que Personnel officer se desarrolle en los hombres mayores de 13 aos. Cules son los signos o sntomas? Los sntomas de esta afeccin incluyen: Levantarse con frecuencia durante la noche para orinar. Necesidad de Garment/textile technologist con ms frecuencia Agricultural consultant. Dificultad para comenzar a eliminar la orina. Disminucin del tamao y de la fuerza del chorro de [place]. Prdida (goteo) luego de Garment/textile technologist. Imposibilidad para orinar. En este caso, es necesario un tratamiento inmediato. Imposibilidad para vaciar la vejiga completamente. Dolor al [place]. Esto es ms comn si tambin hay una infeccin. Infeccin de las vas urinarias (IU). Cmo se diagnostica? Esta afeccin se diagnostica en funcin de los antecedentes mdicos, un examen fsico y los sntomas. Tambin se le realizarn pruebas, por ejemplo: Un estudio despus de vaciar la vejiga. Este mide la cantidad de orina que queda en la vejiga despus de terminar de Garment/textile technologist. Examen rectal digital. En un examen rectal, el mdico controla la prstata colocando un dedo lubricado y enguantado en el recto para sentir la parte posterior de la prstata. Este examen detecta el tamao de la glndula y si hay algn bulto o crecimiento anormal. Anlisis de orina (urinlisis). Estudio de antgeno prosttico especfico (PSA). Este es  un anlisis de sangre que se [place] para Electrical engineer de prstata. Una ecografa. En [employer], se utilizan ondas de sonido para producir de Publishing copy una imagen de la prstata. El mdico puede derivarlo a Teaching laboratory technician en enfermedades del rin y de la prstata (urlogo). Cmo se trata? Una vez que los sntomas comienzan, el mdico controlar la afeccin (vigilancia activa u observacin cautelosa). El tratamiento depender de la gravedad de la afeccin. El tratamiento puede incluir: Observacin y exmenes anuales. Este puede ser el nico tratamiento necesario si la afeccin y los sntomas son leves. Medicamentos para [employer], entre los que se incluyen los siguientes: Medicamentos para Contractor. Medicamentos para relajar el msculo de la prstata. Dwaine Gale, solo [employer] son graves. La ciruga puede incluir lo siguiente: Prostatectoma. En este procedimiento, se extrae el tejido de la prstata completamente, a travs de [place]  incisin abierta, con un laparoscopio o con robtica. Reseccin transuretral de la prstata (RTUP). En este procedimiento, se inserta una herramienta a travs de la abertura en la punta del pene (uretra). Se utiliza para cortar tejido del centro interior de la prstata. Los trozos se retiran a Designer, jewellery de la misma abertura del pene. De este modo, se libera la obstruccin. Incisin transuretral (ITUP). En este procedimiento, se hacen pequeos cortes en la prstata. Esto alivia la presin de la prstata sobre la uretra. Termoterapia transuretral con microondas (TTUM). En este procedimiento, se utilizan microondas para Special educational needs teacher. El calor destruye y extirpa una pequea cantidad de tejido prosttico. Ablacin transuretral con aguja (ATUA). En este procedimiento, se utiliza la radiofrecuencia para destruir y extirpar una pequea cantidad de tejido prosttico. Coagulacin intersticial con lser (CIL). En este procedimiento, se  utiliza un lser para destruir y extirpar una pequea cantidad de tejido prosttico. Electrovaporizacin transuretral (EVTU). En este procedimiento, se utilizan electrodos para destruir y extirpar una pequea cantidad de tejido prosttico. Liberacin uretral prosttica. En este procedimiento, se inserta un implante para ejercer presin en los lbulos de la prstata, en direccin contraria a la uretra. Siga estas instrucciones en su casa: Use los medicamentos de venta libre y los recetados solamente como se lo haya indicado el mdico. Controle si hay cambios en los sntomas. Hable con su mdico antes de hacer cualquier cambio. Evite beber grandes cantidades de lquido antes de irse a la cama o de salir de su casa. Evite o reduzca la cantidad de cafena o alcohol que consume. Tmese tiempo para orinar. Concurra a [place]. Esto es importante. Comunquese con un mdico si: Siente dolor en la espalda sin explicacin. Los sntomas no mejoran con Dispensing optician. Los [employer] causan efectos secundarios. La orina se vuelve muy oscura o tiene mal olor. Siente que la parte inferior del abdomen est distendida y tiene problemas para Garment/textile technologist. Solicite ayuda de inmediato si: Tiene fiebre o escalofros. De repente no [employer]. Se siente aturdido o [employer], o se [place]. Observa gran cantidad de sangre o cogulos en la orina. Sus problemas urinarios se vuelven difciles de Chief Technology Officer. Siente dolor moderado a intenso en la espalda o en la fosa lumbar. La fosa lumbar es el costado del cuerpo entre las costillas y la cadera. Estos sntomas pueden Sales executive. Solicite ayuda de inmediato. Llame al 911. No espere a ver si los sntomas desaparecen. No conduzca por sus propios medios [employer] hospital. Resumen La hiperplasia prosttica benigna (HBP) es un aumento del tamao de la prstata que es causado por el proceso normal de envejecimiento. No es causada por Medical illustrator. Una prstata agrandada puede presionar la uretra. Esto puede dificultar el paso de la [place]. Es ms probable que Personnel officer se desarrolle en los hombres mayores de 45 aos. Obtenga atencin de inmediato si de repente no puede orinar. Esta informacin no tiene Marine scientist el consejo del mdico. Asegrese de hacerle al mdico cualquier pregunta que tenga. Document Revised: 06/09/2021 Document Reviewed: 06/09/2021 Elsevier Patient Education  [employer].

## 2023-01-19 NOTE — Progress Notes (Signed)
01/19/23 4:03 PM   Matthew Jordan 07-Jan-1951 [id number]  CC: Elevated PSA, BPH, ED  HPI: 72 year old male here today with his daughter who provides most of the history.  He reportedly has a long history of PSA ranging from 7-8 over the last 10 years, and underwent a negative prostate biopsy that just showed a significantly enlarged prostate about 8 years ago in [place], those biopsy records are not available to me.  Most recent PSA from 01/07/2023 relatively stable at 8.6(20% free) from 7.4 in December 2021.  He has a long history of obstructive urinary symptoms and has been on Flomax, as well as finasteride in the past.  He reports at least 5 episodes of urinary retention in the past, UTI, as well as reportedly a suprapubic tube in [place] at some point.  Currently he denies any urinary symptoms, but if he misses doses of the Flomax his urination worsens.  No cross-sectional imaging in our system to review.  Urinalysis benign, renal function normal.  He also reports some problems obtaining and maintaining an erection, he has never tried medications for this.  He also has some retrograde ejaculation from the Flomax.   PMH: Past Medical History:  Diagnosis Date   Arthritis    BPH with urinary obstruction    CAD (coronary artery disease) Fall 2014   40% stenosis of LAD on cardiac cath   CAD (coronary artery disease)    Cardiac arrhythmia    Cardiac dysrhythmia    Chest pain    CHF (congestive heart failure) (HCC)    Dysphagia    ED (erectile dysfunction)    Elevated PSA    GERD (gastroesophageal reflux disease)    Hypertension    Irregular heart beat    Left ventricular hypertrophy Fall 2014   mild concentric   Prostate finding    Sleep apnea    SOB (shortness of breath)    SOB (shortness of breath)     Surgical History: Past Surgical History:  Procedure Laterality Date   CARDIAC CATHETERIZATION  2014   [place]   HERNIA REPAIR     Family History: Family History   Problem Relation Age of Onset   Hypertension Mother    Lung disease Mother    Hypertension Father    Cancer Father        possible prostate cancer   Kidney disease Neg Hx     Social History:  reports that he quit smoking about 42 years ago. His smoking use included cigarettes. He has a 2.50 pack-year smoking history. He has been exposed to tobacco smoke. He has never used smokeless tobacco. He reports that he does not currently use alcohol. He reports that he does not use drugs.  Physical Exam: BP 132/78   Pulse 64   Ht '5\' 10"'$  (1.778 m)   Wt 251 lb (113.9 kg)   BMI 36.01 kg/m    Constitutional:  Alert and oriented, No acute distress. Cardiovascular: No clubbing, cyanosis, or edema. Respiratory: Normal respiratory effort, no increased work of breathing. GI: Abdomen is soft, nontender, nondistended, no abdominal masses   Assessment & Plan:   72 year old male with a long history of elevated PSA ranging from 7-8 over the last 10 years, including a negative biopsy in [place] about 8 years ago that showed significantly enlarged prostate but no evidence of malignancy.  Recent PSA relatively stable at 8.6 with reassuring 20% free.  Suspect BPH as the etiology of his elevated PSA.  We also  discussed options for his urinary symptoms including cystoscopy/TRUS for further evaluation and consideration of an outlet procedure like HOLEP, or addition of finasteride.  He is not interested in further workup or evaluation at this time, and would like to continue Flomax.  Return precautions discussed extensively.  We discussed that routine PSA screening is not recommended in men over age 16, especially with other comorbidities.  Flomax refilled, trial of Cialis 5 to 10 mg on demand for ED RTC 6 months PVR Could consider HOLEP in the future as well as cystoscopy/TRUS for evaluation of outlet procedures with his history of recurrent retention, UTI, and BPH   Nickolas Madrid, MD 01/19/2023  [facility] [address], [place], [place] [address] [phone]

## 2023-01-21 ENCOUNTER — Other Ambulatory Visit: Payer: Self-pay | Admitting: Medical

## 2023-01-21 DIAGNOSIS — I5032 Chronic diastolic (congestive) heart failure: Secondary | ICD-10-CM

## 2023-02-08 ENCOUNTER — Other Ambulatory Visit: Payer: Self-pay | Admitting: Physician Assistant

## 2023-02-09 ENCOUNTER — Ambulatory Visit (INDEPENDENT_AMBULATORY_CARE_PROVIDER_SITE_OTHER): Payer: Medicare HMO | Admitting: Physician Assistant

## 2023-02-09 VITALS — BP 138/87 | HR 59 | Temp 98.5°F | Wt 250.0 lb

## 2023-02-09 DIAGNOSIS — N138 Other obstructive and reflux uropathy: Secondary | ICD-10-CM | POA: Diagnosis not present

## 2023-02-09 DIAGNOSIS — J301 Allergic rhinitis due to pollen: Secondary | ICD-10-CM | POA: Diagnosis not present

## 2023-02-09 DIAGNOSIS — N401 Enlarged prostate with lower urinary tract symptoms: Secondary | ICD-10-CM | POA: Diagnosis not present

## 2023-02-09 DIAGNOSIS — L600 Ingrowing nail: Secondary | ICD-10-CM

## 2023-02-09 MED ORDER — TAMSULOSIN HCL 0.4 MG PO CAPS: 0.4000 mg | ORAL_CAPSULE | Freq: Every day | ORAL | 5 refills | Status: DC

## 2023-02-09 NOTE — Telephone Encounter (Signed)
Requested Prescriptions  Pending Prescriptions Disp Refills   tamsulosin (FLOMAX) 0.4 MG CAPS capsule [Pharmacy Med Name: TAMSULOSIN HCL 0.4 MG CAPSULE] 90 capsule 1    Sig: TAKE 1 CAPSULE BY MOUTH EVERY DAY     Urology: Alpha-Adrenergic Blocker Passed - 02/08/2023  7:22 PM      Passed - PSA in normal range and within 360 days    Prostate Specific Ag, Serum  Date Value Ref Range Status  01/07/2023 8.6 (H) 0.0 - 4.0 ng/mL Final    Comment:    Roche ECLIA methodology. According to the American Urological Association, Serum PSA should decrease and remain at undetectable levels after radical prostatectomy. The AUA defines biochemical recurrence as an initial PSA value 0.2 ng/mL or greater followed by a subsequent confirmatory PSA value 0.2 ng/mL or greater. Values obtained with different assay methods or kits cannot be used interchangeably. Results cannot be interpreted as absolute evidence of the presence or absence of malignant disease.          Passed - Last BP in normal range    BP Readings from Last 1 Encounters:  02/09/23 138/87         Passed - Valid encounter within last 12 months    Recent Outpatient Visits           Today BPH with obstruction/lower urinary tract symptoms   [facility] [place], [place], PA-C   1 month ago Screening for prostate cancer   [facility] [place], [place], PA-C   2 months ago Chronic knee pain, unspecified laterality   [facility] [place], [place], PA-C   9 months ago Chronic right hip pain   [facility] [place], [place], PA-C   2 years ago No-show for appointment   [facility] [place], Kelby [place], [place]       Future Appointments             In 1 month [place], Letitia Libra, PA-C [facility], [place]   In 1 month [place], Aaron Edelman, MD [facility] [place]   In 5 months Diam[facility] [place],  Herbert Seta, MD [facility] [place]

## 2023-02-09 NOTE — Progress Notes (Signed)
Ar[place]ina P[place],acting as a Education administrator for [employer], PA-C.,have documented all relevant documentation on the behalf of Mardene Speak, PA-C,as directed by  [employer], PA-C while in the presence of [employer], PA-C.     Established patient visit   Patient: Matthew Jordan   DOB: 06-09-1951   72 y.o. Male  MRN: [id number] Visit Date: 02/09/2023  Today's healthcare provider: Mardene Speak, PA-C  CC: ingrown toenails, still having nasal congestion and itchy eyes  Subjective    HPI  Patient is a 72 year old male who presents for evaluation of right great toe pain.  Patient reports that he is currently being treated for onychomycosis with Lamisil cream.  He was put on oral Lamisil but it was too hard on his stomach and body.   He states that the treatment is going slow and he is concerned about possible ingrown toenail and/or infection.  He feels there is pressure pressing down on the toe from shoes and this may be contributing to make it worse.     He was seen by podiatry and was put on the Lamisil but that provider is not on his insurance and they would like to be referred to someone in network.  They would like to discuss the possibility of removing the toenail  The patient states that when he trims his nails he has to cut the side of the nail and causes it to bleed.   Medications: Outpatient Medications Prior to Visit  Medication Sig   apixaban (ELIQUIS) 5 MG TABS tablet Take 1 tablet (5 mg total) by mouth 2 (two) times daily.   clotrimazole-betamethasone (LOTRISONE) cream Apply 1 Application topically 2 (two) times daily.   diltiazem (CARDIZEM CD) 240 MG 24 hr capsule Take 1 capsule (240 mg total) by mouth daily.   furosemide (LASIX) 40 MG tablet TAKE 1 TABLET BY MOUTH DAILY AS NEEDED   lisinopril-hydrochlorothiazide (ZESTORETIC) 10-12.5 MG tablet Take 1 tablet by mouth daily.   ROCKLATAN 0.02-0.005 % SOLN Apply 1 drop to eye at bedtime.   tadalafil (CIALIS) 5 MG tablet Take  1-2 tablets (5-10 mg total) by mouth daily as needed for erectile dysfunction (take 1 hour prior to sexual activity).   tamsulosin (FLOMAX) 0.4 MG CAPS capsule Take 1 capsule (0.4 mg total) by mouth daily.   terbinafine (LAMISIL) 250 MG tablet Take 1 tablet (250 mg total) by mouth daily.   traMADol (ULTRAM) 50 MG tablet Take 0.5-1 tablets (25-50 mg total) by mouth every 12 (twelve) hours as needed for moderate pain.   No facility-administered medications prior to visit.    Review of Systems  All other systems reviewed and are negative. Marland Kitchenexcept see HPI     Objective    BP 138/87 (BP Location: Right Arm, Patient Position: Sitting, Cuff Size: Normal)   Pulse (!) 59   Temp 98.5 F (36.9 C) (Oral)   Wt 250 lb (113.4 kg)   SpO2 95%   BMI 35.87 kg/m    Physical Exam Vitals reviewed.  Constitutional:      General: He is not in acute distress.    Appearance: Normal appearance. He is not diaphoretic.  HENT:     Head: Normocephalic and atraumatic.  Eyes:     General: No scleral icterus.    Conjunctiva/sclera: Conjunctivae normal.  Cardiovascular:     Rate and Rhythm: Normal rate and regular rhythm.     Pulses: Normal pulses.     Heart sounds: Normal heart sounds. No  murmur heard. Pulmonary:     Effort: Pulmonary effort is normal. No respiratory distress.     Breath sounds: Normal breath sounds. No wheezing or rhonchi.  Musculoskeletal:        General: Tenderness present. Normal range of motion.     Cervical back: Normal range of motion and neck supple.     Right lower leg: No edema.     Left lower leg: No edema.  Lymphadenopathy:     Cervical: No cervical adenopathy.  Skin:    General: Skin is warm and dry.     Findings: No rash.  Neurological:     Mental Status: He is alert and oriented to person, place, and time. Mental status is at baseline.  Psychiatric:        Mood and Affect: Mood normal.        Behavior: Behavior normal.      No results found for any visits on  02/09/23.  Assessment & Plan     1. 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  Dispense: 30 capsule; Refill: 5 Reviewed a chart from 01/19/23/Urology Will send enough medication until he will see his Urology  2. Ingrown toenail of right foot New problem Could be due to onychomycosis vs wearing  tight shoes  Slightly inflamed and tender on touch Advised warm foot bath with Epson salt for 20' BID Reviewed a note from 01/04/23/ Podiatry. Referral to podiatry will be placed if his current podiatry services were not covered by his med insurance. Will check with scheduler. Will monitor  3. Seasonal allergic rhinitis due to pollen Allergic Rhinitis: - No allergy testing was done - Avoidance measures discussed. - Use nasal saline rinses before nose sprays such as with Neilmed Sinus Rinse bottle.  Use distilled water.   - Use Flonase 2 sprays each nostril daily. Aim upward and outward. - Use Azelastine 1-2 sprays each nostril twice daily as needed. Aim upward and outward. - Use Zyrtec 10 mg daily.  Will monitor     No follow-ups on file. Needs to schedule annual wellness, then CPE.     The patient was advised to call back or seek an in-person evaluation if the symptoms worsen or if the condition fails to improve as anticipated.  I discussed the assessment and treatment plan with the patient. The patient was provided an opportunity to ask questions and all were answered. The patient agreed with the plan and demonstrated an understanding of the instructions.  I, Mardene Speak, PA-C have reviewed all documentation for this visit. The documentation on  02/09/23 for the exam, diagnosis, procedures, and orders are all accurate and complete.  Mardene Speak, [facility], [place] [phone] (phone) [phone] (fax)   [place]

## 2023-02-11 ENCOUNTER — Encounter: Payer: Self-pay | Admitting: Physician Assistant

## 2023-02-18 ENCOUNTER — Ambulatory Visit: Payer: Medicare HMO | Admitting: Physician Assistant

## 2023-03-15 ENCOUNTER — Encounter: Payer: Medicare HMO | Admitting: Physician Assistant

## 2023-03-16 ENCOUNTER — Ambulatory Visit (INDEPENDENT_AMBULATORY_CARE_PROVIDER_SITE_OTHER): Payer: Medicare HMO | Admitting: Physician Assistant

## 2023-03-16 ENCOUNTER — Encounter: Payer: Self-pay | Admitting: Physician Assistant

## 2023-03-16 VITALS — BP 122/89 | HR 73 | Ht 70.0 in | Wt 248.0 lb

## 2023-03-16 DIAGNOSIS — Z0000 Encounter for general adult medical examination without abnormal findings: Secondary | ICD-10-CM

## 2023-03-16 DIAGNOSIS — Z1159 Encounter for screening for other viral diseases: Secondary | ICD-10-CM

## 2023-03-16 DIAGNOSIS — Z23 Encounter for immunization: Secondary | ICD-10-CM

## 2023-03-16 DIAGNOSIS — E785 Hyperlipidemia, unspecified: Secondary | ICD-10-CM

## 2023-03-16 DIAGNOSIS — E669 Obesity, unspecified: Secondary | ICD-10-CM

## 2023-03-16 DIAGNOSIS — N401 Enlarged prostate with lower urinary tract symptoms: Secondary | ICD-10-CM

## 2023-03-16 DIAGNOSIS — Z1211 Encounter for screening for malignant neoplasm of colon: Secondary | ICD-10-CM

## 2023-03-16 DIAGNOSIS — M545 Low back pain, unspecified: Secondary | ICD-10-CM

## 2023-03-16 DIAGNOSIS — I10 Essential (primary) hypertension: Secondary | ICD-10-CM

## 2023-03-16 DIAGNOSIS — J301 Allergic rhinitis due to pollen: Secondary | ICD-10-CM

## 2023-03-16 MED ORDER — TETANUS-DIPHTH-ACELL PERTUSSIS 5-2.5-18.5 LF-MCG/0.5 IM SUSY: 0.5000 mL | PREFILLED_SYRINGE | Freq: Once | INTRAMUSCULAR | 0 refills | Status: AC

## 2023-03-16 NOTE — Progress Notes (Addendum)
Marland KitchenFMCAWVMALE .fmcawv .ARMCAMEDCARESUB       Patient: Matthew Jordan, Male    DOB: 1951-03-11, 72 y.o.   MRN: [id number] Visit Date: 03/18/2023  Today's Provider: Debera Lat, PA-C   CC: annual wellness visit  Subjective:    Annual wellness visit Matthew Jordan is a 72 y.o. male. He feels fairly well. He reports exercising daily. He reports he is sleeping fairly well.  -----------------------------------------------------------  Pt stated --that his eye lids are itchy, and red, he is requesting swab for lab.  Review of Systems  HENT:  Positive for congestion, postnasal drip and rhinorrhea.   Eyes:  Positive for discharge, redness and itching.  All other systems reviewed and are negative.   Social History   Socioeconomic History   Marital status: Divorced    Spouse name: Not on file   Number of children: Not on file   Years of education: Not on file   Highest education level: Not on file  Occupational History   Not on file  Tobacco Use   Smoking status: Former    Packs/day: 0.25    Years: 10.00    Additional pack years: 0.00    Total pack years: 2.50    Types: Cigarettes    Quit date: 10/02/1980    Years since quitting: 42.4    Passive exposure: Past   Smokeless tobacco: Never  Vaping Use   Vaping Use: Never used  Substance and Sexual Activity   Alcohol use: Not Currently    Comment: weekly   Drug use: No   Sexual activity: Not on file  Other Topics Concern   Not on file  Social History Narrative   Not on file   Social Determinants of Health   Financial Resource Strain: Not on file  Food Insecurity: Not on file  Transportation Needs: Not on file  Physical Activity: Not on file  Stress: Not on file  Social Connections: Not on file  Intimate Partner Violence: Not on file    Past Medical History:  Diagnosis Date   Arthritis    BPH with urinary obstruction    CAD (coronary artery disease) Fall 2014   40% stenosis of LAD on cardiac cath    CAD (coronary artery disease)    Cardiac arrhythmia    Cardiac dysrhythmia    Chest pain    CHF (congestive heart failure) (HCC)    Dysphagia    ED (erectile dysfunction)    Elevated PSA    GERD (gastroesophageal reflux disease)    Hypertension    Irregular heart beat    Left ventricular hypertrophy Fall 2014   mild concentric   Prostate finding    Sleep apnea    SOB (shortness of breath)    SOB (shortness of breath)      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   History of inguinal hernia repair, bilateral 04/06/2019   Behavior concern 10/08/2015   BPH with obstruction/lower urinary tract symptoms 10/07/2015   Varicose veins 10/03/2015   CAD (coronary artery disease)    Arthritis    GERD (gastroesophageal reflux disease)    Elevated PSA    Left ventricular hypertrophy    Cardiac dysrhythmia    Dysphagia  ED (erectile dysfunction)    Essential hypertension 01/02/2015   Abnormal ECG 01/02/2015    Past Surgical History:  Procedure Laterality Date   CARDIAC CATHETERIZATION  2014   [place]   HERNIA REPAIR      His family history includes Cancer in his father; Hypertension in his father and mother; Lung disease in his mother. There is no history of Kidney disease.   Current Outpatient Medications:    apixaban (ELIQUIS) 5 MG TABS tablet, Take 1 tablet (5 mg total) by mouth 2 (two) times daily., Disp: 60 tablet, Rfl: 3   clotrimazole-betamethasone (LOTRISONE) cream, Apply 1 Application topically 2 (two) times daily., Disp: 45 g, Rfl: 3   diltiazem (CARDIZEM CD) 240 MG 24 hr capsule, Take 1 capsule (240 mg total) by mouth daily., Disp: 90 capsule, Rfl: 3   furosemide (LASIX) 40 MG  tablet, TAKE 1 TABLET BY MOUTH DAILY AS NEEDED, Disp: 90 tablet, Rfl: 1   lisinopril-hydrochlorothiazide (ZESTORETIC) 10-12.5 MG tablet, Take 1 tablet by mouth daily., Disp: 90 tablet, Rfl: 3   ROCKLATAN 0.02-0.005 % SOLN, Apply 1 drop to eye at bedtime., Disp: , Rfl:    tadalafil (CIALIS) 5 MG tablet, Take 1-2 tablets (5-10 mg total) by mouth daily as needed for erectile dysfunction (take 1 hour prior to sexual activity)., Disp: 30 tablet, Rfl: 6   tamsulosin (FLOMAX) 0.4 MG CAPS capsule, Take 1 capsule (0.4 mg total) by mouth daily., Disp: 30 capsule, Rfl: 5   terbinafine (LAMISIL) 250 MG tablet, Take 1 tablet (250 mg total) by mouth daily., Disp: 90 tablet, Rfl: 0   traMADol (ULTRAM) 50 MG tablet, Take 0.5-1 tablets (25-50 mg total) by mouth every 12 (twelve) hours as needed for moderate pain., Disp: 30 tablet, Rfl: 0  Patient Care Team: Debera Lat, PA-C as PCP - General (Physician Assistant) Debbe Odea, MD as PCP - Cardiology (Cardiology) Harle Battiest, PA-C as Physician Assistant (Urology) Midge Minium, MD as Consulting Physician (Gastroenterology) Iran Ouch, MD as Consulting Physician (Cardiology) Debbe Odea, MD as Consulting Physician (Cardiology)    Objective:    Vitals: BP 122/89 (BP Location: Left Arm, Patient Position: Sitting, Cuff Size: Normal)   Pulse 73   Ht 5\' 10"  (1.778 m)   Wt 248 lb (112.5 kg)   SpO2 95%   BMI 35.58 kg/m   Physical Exam Vitals and nursing note reviewed.  Constitutional:      General: He is not in acute distress.    Appearance: Normal appearance. He is obese. He is not ill-appearing, toxic-appearing or diaphoretic.  HENT:     Head: Normocephalic and atraumatic.     Right Ear: Tympanic membrane, ear canal and external ear normal.     Left Ear: Tympanic membrane, ear canal and external ear normal.     Nose: Congestion and rhinorrhea present.     Mouth/Throat:     Mouth: Mucous membranes are moist.     Pharynx:  Oropharynx is clear. No oropharyngeal exudate or posterior oropharyngeal erythema.     Comments: Postnasal drainage Eyes:     General: No scleral icterus.       Right eye: Discharge present.        Left eye: Discharge present.    Extraocular Movements: Extraocular movements intact.     Pupils: Pupils are equal, round, and reactive to light.  Neck:     Vascular: No carotid bruit.  Cardiovascular:     Rate and Rhythm: Normal rate and regular rhythm.  Pulses: Normal pulses.     Heart sounds: Normal heart sounds. No murmur heard.    No friction rub. No gallop.  Pulmonary:     Effort: Pulmonary effort is normal. No respiratory distress.     Breath sounds: Normal breath sounds. No stridor. No wheezing, rhonchi or rales.  Chest:     Chest wall: No tenderness.  Abdominal:     General: Abdomen is flat. Bowel sounds are normal. There is no distension.     Palpations: Abdomen is soft. There is no mass.     Tenderness: There is no abdominal tenderness. There is no right CVA tenderness, left CVA tenderness, guarding or rebound.     Hernia: No hernia is present.  Musculoskeletal:        General: No swelling, tenderness, deformity or signs of injury. Normal range of motion.     Cervical back: Normal range of motion. No rigidity or tenderness.     Right lower leg: No edema.     Left lower leg: No edema.  Lymphadenopathy:     Cervical: No cervical adenopathy.  Skin:    General: Skin is warm.     Capillary Refill: Capillary refill takes less than 2 seconds.     Coloration: Skin is not jaundiced or pale.     Findings: No bruising, erythema, lesion or rash.  Neurological:     Mental Status: He is alert and oriented to person, place, and time. Mental status is at baseline.     Cranial Nerves: No cranial nerve deficit.     Sensory: No sensory deficit.     Motor: No weakness.     Coordination: Coordination normal.     Gait: Gait normal.     Deep Tendon Reflexes: Reflexes normal.   Psychiatric:        Behavior: Behavior normal.        Thought Content: Thought content normal.        Judgment: Judgment normal.    Activities of Daily Living    03/18/2023   11:26 PM  In your present state of health, do you have any difficulty performing the following activities:  Hearing? 1  Difficulty concentrating or making decisions? 0  Walking or climbing stairs? 0  Dressing or bathing? 0  Doing errands, shopping? 0    Fall Risk Assessment    01/07/2023    8:39 AM 04/22/2022    9:41 AM 10/16/2020    9:48 AM  Fall Risk   Falls in the past year? 1 1 1   Number falls in past yr:  1 1  Comment  7 falls per daughter   Injury with Fall? 1 1 1   Follow up  Falls evaluation completed      Depression Screen    01/07/2023    8:40 AM 04/22/2022    9:41 AM 10/16/2020    9:46 AM  PHQ 2/9 Scores  PHQ - 2 Score 2 0 4  PHQ- 9 Score 4 4 18       Annual Wellness Visit  Reviewed patient's Family Medical History Reviewed and updated list of patient's medical providers Assessed patient's functional ability Established a written schedule for health screening services Health Risk Assessent Completed and Reviewed  Exercise Activities and Dietary recommendations  Goals    Weight loss of 5% of pt's current weight via healthy diet and daily exercise encouraged.      Immunization History  Administered Date(s) Administered   Fluad Quad(high Dose 65+) 09/25/2020   PFIZER(Purple  Top)SARS-COV-2 Vaccination 12/04/2019, 12/24/2019    Health Maintenance  Topic Date Due   Medicare Annual Wellness (AWV)  Never done   Pneumonia Vaccine 35+ Years old (1 of 2 - PCV) Never done   DTaP/Tdap/Td (1 - Tdap) Never done   COLONOSCOPY (Pts 45-29yrs Insurance coverage will need to be confirmed)  Never done   Zoster Vaccines- Shingrix (1 of 2) Never done   COVID-19 Vaccine (3 - 2023-24 season) 07/16/2022   INFLUENZA VACCINE  06/16/2023   Hepatitis C Screening  Completed   HPV VACCINES  Aged Out      Discussed health benefits of physical activity, and encouraged him to engage in regular exercise appropriate for his age and condition.    ------------------------------------------------------------------------------------------------------------ Problem List Items Addressed This Visit       Cardiovascular and Mediastinum   Essential hypertension    Chronic and stable Continue current regimen Continue low salt diet and daily exercise - Lipid panel - Hemoglobin A1c - CBC with Differential/Platelet - Comprehensive metabolic panel - TSH Will reassess after  receiving lab results      Relevant Orders   Lipid panel (Completed)   Hemoglobin A1c (Completed)   CBC with Differential/Platelet (Completed)   Comprehensive metabolic panel (Completed)   TSH (Completed)     Other   Obesity (BMI 30-39.9)   Hyperlipidemia    Chronic and stable Continue her current regimen Advised low cholesterol diet and daily exercise - Lipid panel - Hemoglobin A1c - CBC with Differential/Platelet - Comprehensive metabolic panel - TSH Will reassess after  receiving lab results      Relevant Orders   Lipid panel (Completed)   Hemoglobin A1c (Completed)   CBC with Differential/Platelet (Completed)   Comprehensive metabolic panel (Completed)   TSH (Completed)   Other Visit Diagnoses     Colon cancer screening    -  Primary   Relevant Orders   Ambulatory referral to gastroenterology for colonoscopy   Lipid panel (Completed)   Hemoglobin A1c (Completed)   CBC with Differential/Platelet (Completed)   Comprehensive metabolic panel (Completed)   TSH (Completed)   Need for hepatitis C screening test       Relevant Orders   Hepatitis C antibody   Lipid panel (Completed)   Hemoglobin A1c (Completed)   CBC with Differential/Platelet (Completed)   Comprehensive metabolic panel (Completed)   TSH (Completed)   Prescription for Tdap. Vaccine not administered in office.        Relevant  Orders   Lipid panel (Completed)   Hemoglobin A1c (Completed)   CBC with Differential/Platelet (Completed)   Comprehensive metabolic panel (Completed)   TSH (Completed)   Seasonal allergic rhinitis due to pollen         Seasonal allergic rhinitis Treat with Zyrtec and Flonase as well as with Zaditor or Pataday/conjunctivitis Instructions are provided in details Referral to allergy  If symptoms persist Patient was advised to call back or seek an in-person evaluation if the symptoms worsen or if the condition fails to improve as anticipated.  There are no notes for initial annual wellness visit. Checked charts from from [facility]. I discussed the assessment and treatment plan with the patient. The patient was provided an opportunity to ask questions and all were answered. The patient agreed with the plan and demonstrated an understanding of the instructions.  I, Debera Lat, PA-C have reviewed all documentation for this visit. The documentation on  03/16/23  for the exam, diagnosis, procedures, and orders are all  accurate and complete.  Debera Lat, [facility], MMS [facility] [phone] (phone) [phone] (fax)  [facility]

## 2023-03-18 DIAGNOSIS — E785 Hyperlipidemia, unspecified: Secondary | ICD-10-CM | POA: Insufficient documentation

## 2023-03-18 DIAGNOSIS — E669 Obesity, unspecified: Secondary | ICD-10-CM | POA: Insufficient documentation

## 2023-03-18 LAB — COMPREHENSIVE METABOLIC PANEL
ALT: 20 IU/L (ref 0–44)
AST: 19 IU/L (ref 0–40)
Albumin/Globulin Ratio: 1.6 (ref 1.2–2.2)
Albumin: 4.3 g/dL (ref 3.8–4.8)
Alkaline Phosphatase: 78 IU/L (ref 44–121)
BUN/Creatinine Ratio: 18 (ref 10–24)
BUN: 14 mg/dL (ref 8–27)
Bilirubin Total: 0.5 mg/dL (ref 0.0–1.2)
CO2: 22 mmol/L (ref 20–29)
Calcium: 9.4 mg/dL (ref 8.6–10.2)
Chloride: 103 mmol/L (ref 96–106)
Creatinine, Ser: 0.78 mg/dL (ref 0.76–1.27)
Globulin, Total: 2.7 g/dL (ref 1.5–4.5)
Glucose: 110 mg/dL — ABNORMAL HIGH (ref 70–99)
Potassium: 4 mmol/L (ref 3.5–5.2)
Sodium: 140 mmol/L (ref 134–144)
Total Protein: 7 g/dL (ref 6.0–8.5)
eGFR: 94 mL/min/{1.73_m2} (ref >59)

## 2023-03-18 LAB — CBC WITH DIFFERENTIAL/PLATELET
Basophils Absolute: 0 10*3/uL (ref 0.0–0.2)
Basos: 1 %
EOS (ABSOLUTE): 0.2 10*3/uL (ref 0.0–0.4)
Eos: 3 %
Hematocrit: 45.7 % (ref 37.5–51.0)
Hemoglobin: 15.7 g/dL (ref 13.0–17.7)
Immature Grans (Abs): 0 10*3/uL (ref 0.0–0.1)
Immature Granulocytes: 0 %
Lymphocytes Absolute: 1.3 10*3/uL (ref 0.7–3.1)
Lymphs: 26 %
MCH: 31.4 pg (ref 26.6–33.0)
MCHC: 34.4 g/dL (ref 31.5–35.7)
MCV: 91 fL (ref 79–97)
Monocytes Absolute: 0.4 10*3/uL (ref 0.1–0.9)
Monocytes: 8 %
Neutrophils Absolute: 3.1 10*3/uL (ref 1.4–7.0)
Neutrophils: 62 %
Platelets: 193 10*3/uL (ref 150–450)
RBC: 5 x10E6/uL (ref 4.14–5.80)
RDW: 13.1 % (ref 11.6–15.4)
WBC: 5 10*3/uL (ref 3.4–10.8)

## 2023-03-18 LAB — LIPID PANEL
Chol/HDL Ratio: 5.2 ratio — ABNORMAL HIGH (ref 0.0–5.0)
Cholesterol, Total: 171 mg/dL (ref 100–199)
HDL: 33 mg/dL — ABNORMAL LOW (ref >39)
LDL Chol Calc (NIH): 112 mg/dL — ABNORMAL HIGH (ref 0–99)
Triglycerides: 146 mg/dL (ref 0–149)
VLDL Cholesterol Cal: 26 mg/dL (ref 5–40)

## 2023-03-18 LAB — TSH: TSH: 2.66 u[IU]/mL (ref 0.450–4.500)

## 2023-03-18 LAB — HEMOGLOBIN A1C
Est. average glucose Bld gHb Est-mCnc: 123 mg/dL
Hgb A1c MFr Bld: 5.9 % — ABNORMAL HIGH (ref 4.8–5.6)

## 2023-03-18 LAB — HEPATITIS C ANTIBODY: Hep C Virus Ab: NONREACTIVE

## 2023-03-18 NOTE — Progress Notes (Signed)
Hello Matthew Jordan ,   Your labwork results all are within normal limits.  - Except decreased HDL and elevated LDL. The 10-year ASCVD risk score (Arnett DK, et al., 2019) is: 28.6%. Statin are advised if pt agreed - except A1c of 5.9, in prediabetic range. Lifestyle modifications via low carb diet and daily exercise advised. No changes need to be made to medications, and no further tests need to be ordered.  Any questions please reach out to the office or message me on MyChart!  Best,  Debera Lat, PA-C

## 2023-03-18 NOTE — Assessment & Plan Note (Signed)
Chronic and stable Continue current regimen Continue low salt diet and daily exercise - Lipid panel - Hemoglobin A1c - CBC with Differential/Platelet - Comprehensive metabolic panel - TSH Will reassess after  receiving lab results

## 2023-03-18 NOTE — Assessment & Plan Note (Signed)
Chronic and stable Continue her current regimen Advised low cholesterol diet and daily exercise - Lipid panel - Hemoglobin A1c - CBC with Differential/Platelet - Comprehensive metabolic panel - TSH Will reassess after  receiving lab results

## 2023-03-24 DIAGNOSIS — R7303 Prediabetes: Secondary | ICD-10-CM | POA: Insufficient documentation

## 2023-03-28 NOTE — Addendum Note (Signed)
Addended by: Debera Lat on: 03/28/2023 09:21 AM   Modules accepted: Level of Service

## 2023-03-29 ENCOUNTER — Encounter: Payer: Self-pay | Admitting: Cardiology

## 2023-03-29 ENCOUNTER — Ambulatory Visit: Payer: Medicare HMO | Attending: Cardiology | Admitting: Cardiology

## 2023-03-29 VITALS — BP 130/80 | HR 67 | Ht 70.0 in | Wt 249.2 lb

## 2023-03-29 DIAGNOSIS — I10 Essential (primary) hypertension: Secondary | ICD-10-CM | POA: Diagnosis not present

## 2023-03-29 DIAGNOSIS — I480 Paroxysmal atrial fibrillation: Secondary | ICD-10-CM | POA: Diagnosis not present

## 2023-03-29 DIAGNOSIS — I5032 Chronic diastolic (congestive) heart failure: Secondary | ICD-10-CM | POA: Diagnosis not present

## 2023-03-29 DIAGNOSIS — I2510 Atherosclerotic heart disease of native coronary artery without angina pectoris: Secondary | ICD-10-CM

## 2023-03-29 NOTE — Addendum Note (Signed)
Addended by: Debera Lat on: 03/29/2023 01:59 PM   Modules accepted: Level of Service

## 2023-03-29 NOTE — Patient Instructions (Signed)
Medication Instructions:   Your physician recommends that you continue on your current medications as directed. Please refer to the Current Medication list given to you today.  *If you need a refill on your cardiac medications before your next appointment, please call your pharmacy*   Lab Work:  Your physician recommends that you return for lab work in: 3-4 months at the medical mall. You will need to be fasting.  No appt is needed. Hours are M-F 7AM- 6 PM.  If you have labs (blood work) drawn today and your tests are completely normal, you will receive your results only by: MyChart Message (if you have MyChart) OR A paper copy in the mail If you have any lab test that is abnormal or we need to change your treatment, we will call you to review the results.   Testing/Procedures:  None Ordered   Follow-Up: At [facility], you and your health needs are our priority.  As part of our continuing mission to provide you with exceptional heart care, we have created designated Provider Care Teams.  These Care Teams include your primary Cardiologist (physician) and Advanced Practice Providers (APPs -  Physician Assistants and Nurse Practitioners) who all work together to provide you with the care you need, when you need it.  We recommend signing up for the patient portal called "MyChart".  Sign up information is provided on this After Visit Summary.  MyChart is used to connect with patients for Virtual Visits (Telemedicine).  Patients are able to view lab/test results, encounter notes, upcoming appointments, etc.  Non-urgent messages can be sent to your provider as well.   To learn more about what you can do with MyChart, go to [email or web address].    Your next appointment:   6 month(s)  Provider:   You may see Debbe Odea, MD or one of the following Advanced Practice Providers on your designated Care Team:   Nicolasa Ducking, NP Eula Listen, PA-C Cadence Fransico Michael,  PA-C Charlsie Quest, NP

## 2023-03-29 NOTE — Progress Notes (Signed)
Cardiology Office Note:    Date:  03/29/2023   ID:  Matthew Jordan, DOB 12/18/1950, MRN [id number]  PCP:  Debera Lat, PA-C   [facility] Providers Cardiologist:  Debbe Odea, MD     Referring MD: Debera Lat, PA-C   Chief Complaint  Patient presents with   Follow-up    3 month f/u c/o left rib cage. Meds reviewed verbally with pt.    History of Present Illness:    Matthew Jordan is a 72 y.o. male with a hx of CAD (LAD and RCA calcifications on chest CT ), HFpEF, hypertension, paroxysmal atrial fibrillation who presents for follow-up.  Feels okay, denies palpitations, chest pain or shortness of breath.  Stumbled on a curb 2 weeks ago, fell injuring left chest/rib.  Still sore in this area.  Denies edema.  Prior notes Echo 09/2020 EF 75%, grade 1 diastolic dysfunction Myoview 11/22 no ischemia.  Past Medical History:  Diagnosis Date   Arthritis    BPH with urinary obstruction    CAD (coronary artery disease) Fall 2014   40% stenosis of LAD on cardiac cath   CAD (coronary artery disease)    Cardiac arrhythmia    Cardiac dysrhythmia    Chest pain    CHF (congestive heart failure) (HCC)    Dysphagia    ED (erectile dysfunction)    Elevated PSA    GERD (gastroesophageal reflux disease)    Hypertension    Irregular heart beat    Left ventricular hypertrophy Fall 2014   mild concentric   Prostate finding    Sleep apnea    SOB (shortness of breath)    SOB (shortness of breath)     Past Surgical History:  Procedure Laterality Date   CARDIAC CATHETERIZATION  2014   [place]   HERNIA REPAIR      Current Medications: Current Meds  Medication Sig   apixaban (ELIQUIS) 5 MG TABS tablet Take 1 tablet (5 mg total) by mouth 2 (two) times daily.   clotrimazole-betamethasone (LOTRISONE) cream Apply 1 Application topically 2 (two) times daily.   diltiazem (CARDIZEM CD) 240 MG 24 hr capsule Take 1 capsule (240 mg total) by mouth daily.   furosemide  (LASIX) 40 MG tablet TAKE 1 TABLET BY MOUTH DAILY AS NEEDED   lisinopril-hydrochlorothiazide (ZESTORETIC) 10-12.5 MG tablet Take 1 tablet by mouth daily.   ROCKLATAN 0.02-0.005 % SOLN Apply 1 drop to eye at bedtime.   tadalafil (CIALIS) 5 MG tablet Take 1-2 tablets (5-10 mg total) by mouth daily as needed for erectile dysfunction (take 1 hour prior to sexual activity).   tamsulosin (FLOMAX) 0.4 MG CAPS capsule Take 1 capsule (0.4 mg total) by mouth daily.   terbinafine (LAMISIL) 250 MG tablet Take 1 tablet (250 mg total) by mouth daily.   traMADol (ULTRAM) 50 MG tablet Take 0.5-1 tablets (25-50 mg total) by mouth every 12 (twelve) hours as needed for moderate pain.     Allergies:   Amoxicillin   Social History   Socioeconomic History   Marital status: Divorced    Spouse name: Not on file   Number of children: Not on file   Years of education: Not on file   Highest education level: Not on file  Occupational History   Not on file  Tobacco Use   Smoking status: Former    Packs/day: 0.25    Years: 10.00    Additional pack years: 0.00    Total pack years: 2.50  Types: Cigarettes    Quit date: 10/02/1980    Years since quitting: 42.5    Passive exposure: Past   Smokeless tobacco: Never  Vaping Use   Vaping Use: Never used  Substance and Sexual Activity   Alcohol use: Not Currently    Comment: weekly   Drug use: No   Sexual activity: Not on file  Other Topics Concern   Not on file  Social History Narrative   Not on file   Social Determinants of Health   Financial Resource Strain: Not on file  Food Insecurity: Not on file  Transportation Needs: Not on file  Physical Activity: Not on file  Stress: Not on file  Social Connections: Not on file     Family History: The patient's family history includes Cancer in his father; Hypertension in his father and mother; Lung disease in his mother. There is no history of Kidney disease.  ROS:   Please see the history of present  illness.     All other systems reviewed and are negative.  EKGs/Labs/Other Studies Reviewed:    The following studies were reviewed today:   EKG:  EKG is  ordered today.  The ekg ordered today demonstrates normal sinus rhythm.  Recent Labs: 03/17/2023: ALT 20; BUN 14; Creatinine, Ser 0.78; Hemoglobin 15.7; Platelets 193; Potassium 4.0; Sodium 140; TSH 2.660  Recent Lipid Panel    Component Value Date/Time   CHOL 171 03/17/2023 0838   TRIG 146 03/17/2023 0838   HDL 33 (L) 03/17/2023 0838   CHOLHDL 5.2 (H) 03/17/2023 0838   LDLCALC 112 (H) 03/17/2023 0838     Risk Assessment/Calculations:         Physical Exam:    VS:  BP 130/80 (BP Location: Left Arm, Patient Position: Sitting, Cuff Size: Large)   Pulse 67   Ht 5\' 10"  (1.778 m)   Wt 249 lb 4 oz (113.1 kg)   SpO2 98%   BMI 35.76 kg/m     Wt Readings from Last 3 Encounters:  03/29/23 249 lb 4 oz (113.1 kg)  03/16/23 248 lb (112.5 kg)  02/09/23 250 lb (113.4 kg)     GEN:  Well nourished, well developed in no acute distress HEENT: Normal NECK: No JVD; No carotid bruits CARDIAC: RRR, no murmurs, rubs, gallops RESPIRATORY:  Clear to auscultation without rales, wheezing or rhonchi  ABDOMEN: Soft, non-tender, non-distended MUSCULOSKELETAL:  No edema; mild soreness on palpation of left rib cage. SKIN: Warm and dry NEUROLOGIC:  Alert and oriented x 3 PSYCHIATRIC:  Normal affect   ASSESSMENT:    1. Paroxysmal atrial fibrillation (HCC)   2. Coronary artery disease involving native heart, unspecified vessel or lesion type, unspecified whether angina present   3. Primary hypertension   4. Chronic heart failure with preserved ejection fraction (HCC)     PLAN:    In order of problems listed above:  Paroxysmal atrial fibrillation, denies palpitations, EKG showed sinus rhythm.  Continue Eliquis, Cardizem. LAD and RCA coronary calcification.  Myoview 11/22 no ischemia.  Echo 11/21 EF 65%.  Check lipid panel in 3  months. Hypertension, BP controlled.  Cardizem 240 mg, Zestoretic 1 tab daily. HFpEF, appears euvolemic, continue Lasix as needed.   Follow-up in 3 months.   Medication Adjustments/Labs and Tests Ordered: Current medicines are reviewed at length with the patient today.  Concerns regarding medicines are outlined above.  Orders Placed This Encounter  Procedures   Lipid panel   EKG 12-Lead    No  orders of the defined types were placed in this encounter.    Patient Instructions  Medication Instructions:   Your physician recommends that you continue on your current medications as directed. Please refer to the Current Medication list given to you today.  *If you need a refill on your cardiac medications before your next appointment, please call your pharmacy*   Lab Work:  Your physician recommends that you return for lab work in: 3-4 months at the medical mall. You will need to be fasting.  No appt is needed. Hours are M-F 7AM- 6 PM.  If you have labs (blood work) drawn today and your tests are completely normal, you will receive your results only by: MyChart Message (if you have MyChart) OR A paper copy in the mail If you have any lab test that is abnormal or we need to change your treatment, we will call you to review the results.   Testing/Procedures:  None Ordered   Follow-Up: At [facility], you and your health needs are our priority.  As part of our continuing mission to provide you with exceptional heart care, we have created designated Provider Care Teams.  These Care Teams include your primary Cardiologist (physician) and Advanced Practice Providers (APPs -  Physician Assistants and Nurse Practitioners) who all work together to provide you with the care you need, when you need it.  We recommend signing up for the patient portal called "MyChart".  Sign up information is provided on this After Visit Summary.  MyChart is used to connect with patients for  Virtual Visits (Telemedicine).  Patients are able to view lab/test results, encounter notes, upcoming appointments, etc.  Non-urgent messages can be sent to your provider as well.   To learn more about what you can do with MyChart, go to [email or web address].    Your next appointment:   6 month(s)  Provider:   You may see Debbe Odea, MD or one of the following Advanced Practice Providers on your designated Care Team:   Nicolasa Ducking, NP Eula Listen, PA-C Cadence Fransico Michael, PA-C Charlsie Quest, NP   Signed, Debbe Odea, MD  03/29/2023 9:59 AM    [facility]

## 2023-03-30 ENCOUNTER — Encounter: Payer: Self-pay | Admitting: *Deleted

## 2023-04-08 ENCOUNTER — Emergency Department
Admission: EM | Admit: 2023-04-08 | Discharge: 2023-04-08 | Disposition: A | Discharge status: Home / Self Care | Payer: Medicare HMO | Attending: Emergency Medicine | Admitting: Emergency Medicine

## 2023-04-08 ENCOUNTER — Other Ambulatory Visit: Payer: Self-pay

## 2023-04-08 DIAGNOSIS — N3001 Acute cystitis with hematuria: Secondary | ICD-10-CM | POA: Diagnosis not present

## 2023-04-08 DIAGNOSIS — I2510 Atherosclerotic heart disease of native coronary artery without angina pectoris: Secondary | ICD-10-CM | POA: Insufficient documentation

## 2023-04-08 DIAGNOSIS — R338 Other retention of urine: Secondary | ICD-10-CM

## 2023-04-08 DIAGNOSIS — R300 Dysuria: Secondary | ICD-10-CM | POA: Diagnosis present

## 2023-04-08 LAB — CBC
HCT: 44.5 % (ref 39.0–52.0)
Hemoglobin: 15.5 g/dL (ref 13.0–17.0)
MCH: 31.4 pg (ref 26.0–34.0)
MCHC: 34.8 g/dL (ref 30.0–36.0)
MCV: 90.3 fL (ref 80.0–100.0)
Platelets: 217 10*3/uL (ref 150–400)
RBC: 4.93 MIL/uL (ref 4.22–5.81)
RDW: 12.2 % (ref 11.5–15.5)
WBC: 6.3 10*3/uL (ref 4.0–10.5)
nRBC: 0 % (ref 0.0–0.2)

## 2023-04-08 LAB — URINALYSIS, ROUTINE W REFLEX MICROSCOPIC
Bilirubin Urine: NEGATIVE
Glucose, UA: NEGATIVE mg/dL
Ketones, ur: NEGATIVE mg/dL
Leukocytes,Ua: NEGATIVE
Nitrite: NEGATIVE
Protein, ur: 30 mg/dL — AB
RBC / HPF: >50 RBC/hpf (ref 0–5)
Specific Gravity, Urine: 1.011 (ref 1.005–1.030)
Squamous Epithelial / HPF: NONE SEEN /HPF (ref 0–5)
pH: 6 (ref 5.0–8.0)

## 2023-04-08 LAB — BASIC METABOLIC PANEL
Anion gap: 6 (ref 5–15)
BUN: 13 mg/dL (ref 8–23)
CO2: 23 mmol/L (ref 22–32)
Calcium: 9.2 mg/dL (ref 8.9–10.3)
Chloride: 109 mmol/L (ref 98–111)
Creatinine, Ser: 0.77 mg/dL (ref 0.61–1.24)
GFR, Estimated: >60 mL/min (ref >60)
Glucose, Bld: 127 mg/dL — ABNORMAL HIGH (ref 70–99)
Potassium: 3.7 mmol/L (ref 3.5–5.1)
Sodium: 138 mmol/L (ref 135–145)

## 2023-04-08 MED ORDER — FENTANYL CITRATE PF 50 MCG/ML IJ SOSY
50.0000 ug | PREFILLED_SYRINGE | Freq: Once | INTRAMUSCULAR | Status: AC
  Administered 2023-04-08: 50 ug via INTRAVENOUS
  Filled 2023-04-08: qty 1

## 2023-04-08 MED ORDER — CEPHALEXIN 500 MG PO CAPS: 500.0000 mg | ORAL_CAPSULE | Freq: Two times a day (BID) | ORAL | 0 refills | Status: DC

## 2023-04-08 MED ORDER — FENTANYL CITRATE PF 50 MCG/ML IJ SOSY
25.0000 ug | PREFILLED_SYRINGE | Freq: Once | INTRAMUSCULAR | Status: AC
  Administered 2023-04-08: 25 ug via INTRAVENOUS
  Filled 2023-04-08: qty 1

## 2023-04-08 MED ORDER — SODIUM CHLORIDE 0.9 % IV SOLN
1.0000 g | INTRAVENOUS | Status: AC
  Administered 2023-04-08: 1 g via INTRAVENOUS
  Filled 2023-04-08: qty 10

## 2023-04-08 MED ORDER — LIDOCAINE HCL URETHRAL/MUCOSAL 2 % EX GEL
1.0000 | Freq: Once | CUTANEOUS | Status: AC
  Administered 2023-04-08: 1 via URETHRAL
  Filled 2023-04-08: qty 10

## 2023-04-08 NOTE — ED Notes (Signed)
Patient was switched over to leg bag.

## 2023-04-08 NOTE — ED Notes (Signed)
Foley placement was attempted with the coude without success.  There was blood at end of tip. MD and day nurse made aware.  Pt continues to be uncomfortable.

## 2023-04-08 NOTE — ED Provider Notes (Signed)
[facility] Provider Note    Event Date/Time   First MD Initiated Contact with Patient 04/08/23 [phone]     (approximate)   History   Dysuria  Spanish video interpreter utilized throughout  HPI  Matthew Jordan is a 72 y.o. male   history of coronary artery disease, LVH, erectile dysfunction, BPH  Patient reports for 5 days he started noticing difficulty with urinating.  He had to strain to urinate.  Today he was unable to urinate anything but a drop or 2.  He feels like his bladder is very full and painful.  He reports the whole area down around his bladder feels some pain  Has had no fever no nausea or vomiting.  He is taking Flomax.  He has previously seen a urologist but does not recall the doctor specific name but their clinic is located and [place].      Physical Exam   Triage Vital Signs: ED Triage Vitals  Enc Vitals Group     BP 04/08/23 0624 (!) 158/99     Pulse Rate 04/08/23 0624 75     Resp 04/08/23 0624 (!) 22     Temp 04/08/23 0624 97.9 F (36.6 C)     Temp Source 04/08/23 0624 Oral     SpO2 04/08/23 0624 99 %     Weight 04/08/23 0625 250 lb (113.4 kg)     Height --      Head Circumference --      Peak Flow --      Pain Score 04/08/23 0625 10     Pain Loc --      Pain Edu? --      Excl. in GC? --     Most recent vital signs: Vitals:   04/08/23 0930 04/08/23 1000  BP: (!) 145/88 113/68  Pulse: 61 (!) 59  Resp:    Temp:    SpO2: 96% 97%     General: Awake, no distress.  CV:  Good peripheral perfusion.  Normal tones Resp:  Normal effort.  Abd:  No distention.  Abdomen soft nontender except in the suprapubic region he reports tenderness.  Normal scrotum nontender testicles.  He has a normal circumcised penis.  Small amount of blood is noted at the tip of the urethra, nursing previously reported for attempts at bladder catheterization with a small amount of blood resulting. Other:     ED Results / Procedures / Treatments    Labs (all labs ordered are listed, but only abnormal results are displayed) Labs Reviewed  BASIC METABOLIC PANEL - Abnormal; Notable for the following components:      Result Value   Glucose, Bld 127 (*)    All other components within normal limits  URINALYSIS, ROUTINE W REFLEX MICROSCOPIC - Abnormal; Notable for the following components:   Color, Urine YELLOW (*)    APPearance HAZY (*)    Hgb urine dipstick MODERATE (*)    Protein, ur 30 (*)    Bacteria, UA RARE (*)    All other components within normal limits  CBC     EKG     RADIOLOGY     PROCEDURES:  Critical Care performed: No  Procedures   MEDICATIONS ORDERED IN ED: Medications  fentaNYL (SUBLIMAZE) injection 50 mcg (50 mcg Intravenous Given 04/08/23 0728)  lidocaine (XYLOCAINE) 2 % jelly 1 Application (1 Application Urethral Given 04/08/23 0945)  cefTRIAXone (ROCEPHIN) 1 g in sodium chloride 0.9 % 100 mL IVPB (0 g Intravenous  Stopped 04/08/23 0835)  fentaNYL (SUBLIMAZE) injection 25 mcg (25 mcg Intravenous Given 04/08/23 0810)  fentaNYL (SUBLIMAZE) injection 50 mcg (50 mcg Intravenous Given 04/08/23 0904)     IMPRESSION / MDM / ASSESSMENT AND PLAN / ED COURSE  I reviewed the triage vital signs and the nursing notes.                              Differential diagnosis includes, but is not limited to, bladder obstruction, BPH, cystitis, acute kidney injury, etc.  He does have a history of BPH and I suspect this is likely related now with near complete inability to void.  Does appear to be a painful condition.  Nursing is attempted to Foley catheter insertions (4X, 2 of which with Caude), unsuccessful. Bladder scan 500+ per RN.  At this juncture, I have placed a consult to urology.  Case discussed and consult placed requesting urgent consultation to Dr. Lonna Cobb  who acknowledges consult at 745am   Patient's presentation is most consistent with acute complicated illness / injury requiring diagnostic  workup.   The patient is on the cardiac monitor to evaluate for evidence of arrhythmia and/or significant heart rate changes having been given IV fentanyl  Clinical Course as of 04/08/23 1008  Fri Apr 08, 2023  0806 Labs interpreted as normal renal function and CBC.  Urinalysis suspicious for infection with hazy appearance and many bacteria and white cells noted. [MQ]    Clinical Course User Index [MQ] Sharyn Creamer, MD   ----------------------------------------- 10:07 AM on 04/08/2023 ----------------------------------------- Successful urinary catheterization performed by urologist.  Foley catheter placed.  Will continue on antibiotic with plan for urology clinic per urologist, to call and set up a follow-up appointment.  Also provided patient with information and recommendation to set up a follow-up within about 1 week with urology.  Keflex prescription sent to the patient's pharmacy.  Advised the patient he is not to drive home this morning as he was given multiple doses of strong pain medication.  Foley care instructions provided by RN  Return precautions and treatment recommendations and follow-up discussed with the patient who is agreeable with the plan. (Via Spanish interpreter)  FINAL CLINICAL IMPRESSION(S) / ED DIAGNOSES   Final diagnoses:  Acute urinary retention  Acute cystitis with hematuria     Rx / DC Orders   ED Discharge Orders          Ordered    cephALEXin (KEFLEX) 500 MG capsule  2 times daily        04/08/23 1005             Note:  This document was prepared using Dragon voice recognition software and may include unintentional dictation errors.   Sharyn Creamer, MD 04/08/23 1008

## 2023-04-08 NOTE — ED Notes (Signed)
Attempted x 2 to get a 16 french foley catheter in pt. The catheter would advance, but no urine would return.  Pt had approximately 500cc's of urine scanned from bladder scanner and states that he is uncomfortable. Supply has been called for appropriate supplies. Some blood was noticed around the tip of the catheter once it was taken out after failing to see urine draining from the bag. Pt does have a known hx/o BPH. MD made aware

## 2023-04-08 NOTE — ED Notes (Signed)
Patient was able to urinate 20mL of pink tinged urine

## 2023-04-08 NOTE — ED Triage Notes (Addendum)
Pt to ED via POV c/o dysuria and painful urination. Pt reports he's been having dribbling for about an hr. Normal BM yesterday. Hx of BPH

## 2023-04-08 NOTE — ED Notes (Signed)
Urologist successfully inserted 80fr coude catheter.

## 2023-04-08 NOTE — Consult Note (Signed)
   Urology Consult  Requesting physician: Sharyn Creamer, MD  Reason for consultation: Urinary retention with inability to place Foley catheter  Chief Complaint: Urinary retention  History of Present Illness: Matthew Jordan is a 72 y.o. who saw Dr. Richardo Hanks 01/19/2023 for elevated PSA and BPH.  He elected medical management.  Presented to the ED this morning with a 5-day history of progressive difficulty voiding and inability to void this morning.  ED staff unsuccessful at Foley catheter x 2 and coud catheter x 2.  Our PA was also unable to place a coud catheter.  Past Medical History:  Diagnosis Date   Arthritis    BPH with urinary obstruction    CAD (coronary artery disease) Fall 2014   40% stenosis of LAD on cardiac cath   CAD (coronary artery disease)    Cardiac arrhythmia    Cardiac dysrhythmia    Chest pain    CHF (congestive heart failure) (HCC)    Dysphagia    ED (erectile dysfunction)    Elevated PSA    GERD (gastroesophageal reflux disease)    Hypertension    Irregular heart beat    Left ventricular hypertrophy Fall 2014   mild concentric   Prostate finding    Sleep apnea    SOB (shortness of breath)    SOB (shortness of breath)     Past Surgical History:  Procedure Laterality Date   CARDIAC CATHETERIZATION  2014   [place]   HERNIA REPAIR      Home Medications:  Current Meds  Medication Sig   cephALEXin (KEFLEX) 500 MG capsule Take 1 capsule (500 mg total) by mouth 2 (two) times daily.    Allergies:  Allergies  Allergen Reactions   Amoxicillin Other (See Comments)    Family History  Problem Relation Age of Onset   Hypertension Mother    Lung disease Mother    Hypertension Father    Cancer Father        possible prostate cancer   Kidney disease Neg Hx     Social History:  reports that he quit smoking about 42 years ago. His smoking use included cigarettes. He has a 2.50 pack-year smoking history. He has been exposed to tobacco smoke. He has  never used smokeless tobacco. He reports that he does not currently use alcohol. He reports that he does not use drugs.   Physical Exam:  Vital signs in last 24 hours: Temp:  [97.9 F (36.6 C)] 97.9 F (36.6 C) (05/24 0624) Pulse Rate:  [59-75] 59 (05/24 1000) Resp:  [18-22] 18 (05/24 0815) BP: (113-158)/(68-99) 113/68 (05/24 1000) SpO2:  [94 %-99 %] 97 % (05/24 1000) Weight:  [113.4 kg] 113.4 kg (05/24 0625) Constitutional:  Alert, moderate distress secondary to urinary retention HEENT: [place] AT Respiratory: Normal respiratory effort   Impression/Plan:   1.  BPH with urinary retention A 14 French Coloplast coud catheter was inserted without difficulty with clear urine obtained Leave Foley catheter indwelling 7-10 days Will schedule voiding trial, cystoscopy and TRUS prostate volume with Dr. Richardo Hanks   04/08/2023, 10:11 AM  Irineo Axon,  MD

## 2023-04-08 NOTE — ED Notes (Signed)
Urology PA unsuccessful at foley attempt. Urologist will be paged by PA.

## 2023-04-08 NOTE — ED Provider Notes (Signed)
Clarified via and operative, patient reports he gets a rash with amoxicillin.  Denies any severe reaction or anaphylactic type symptoms.  Will give dose of Rocephin   Sharyn Creamer, MD 04/08/23 [phone]

## 2023-04-08 NOTE — ED Provider Notes (Signed)
Advised patient on return precautions, he is already understanding of the plan to follow-up with urology, and instructed him he is not to drive this morning as we gave him sedating medication.  He advises that he will wait for a friend to pick him up   Sharyn Creamer, MD 04/08/23 1012

## 2023-04-13 ENCOUNTER — Encounter: Payer: Self-pay | Admitting: Urology

## 2023-04-13 ENCOUNTER — Ambulatory Visit (INDEPENDENT_AMBULATORY_CARE_PROVIDER_SITE_OTHER): Payer: Medicare HMO | Admitting: Urology

## 2023-04-13 VITALS — BP 93/54 | HR 83 | Ht 66.0 in | Wt 244.4 lb

## 2023-04-13 DIAGNOSIS — N401 Enlarged prostate with lower urinary tract symptoms: Secondary | ICD-10-CM

## 2023-04-13 DIAGNOSIS — N138 Other obstructive and reflux uropathy: Secondary | ICD-10-CM | POA: Diagnosis not present

## 2023-04-13 NOTE — Patient Instructions (Signed)
Enucleacin de la prstata con lser de Holmio (HoLEP)  Holmium Laser Enucleation of the Prostate (HoLEP)  La HoLEP es un tratamiento para los hombres con hiperplasia prosttica benigna (BPH, por sus siglas en ingls). La intervencin con lser elimina las obstrucciones del flujo de la [place] y se hace sin ninguna incisin en el cuerpo.  Qu es la HoLEP?  La HoLEP es un tipo de intervencin con lser que se [place] para tratar la obstruccin (bloqueo) del flujo de la orina como resultado de la hiperplasia prosttica benigna (BPH). En los hombres con BPH, la glndula prosttica no es cancerosa, [place] se ha aumentado de [place]. El aumento de tamao de la prstata puede provocar una serie de sntomas en las vas urinarias, tales como un chorro de orina escaso, dificultad para Corporate investment banker a Geographical information systems officer, incapacidad para Geographical information systems officer, Geographical information systems officer con frecuencia o levantarse por la noche para Geographical information systems officer.  La HoLEP se desarroll en la dcada de los 1990's como una opcin quirrgica ms eficaz y menos costosa para la BPH, en comparacin con otras opciones quirrgicas tales como la vaporizacin (PVP/lser de luz verde), la extirpacin transuretral de la prstata (TURP) y la prostatectoma abierta simple.  Qu ocurre durante una intervencin de HoLEP?   La HoLEP requiere anestesia general (el paciente est "dormido" durante el procedimiento).   Se le da un antibitico para reducir el riesgo de infeccin.   Se introduce un instrumento quirrgico llamado resectoscopio a travs de la uretra (el conducto que lleva la orina desde la vejiga). El resectoscopio tiene una cmara que le permite al cirujano ver la estructura interna de la glndula prosttica y ver por dnde se hacen las incisiones durante la [place].   El lser se inserta en el resectoscopio y se [place] para Pension scheme manager (separar) el tejido prosttico agrandado de la cpsula (cubierta exterior) y luego sellar los vasos sanguneos. El tejido extirpado se introduce en la  vejiga.   Se coloca un morcelador a travs del resectoscopio y se [place] para succionar el tejido prosttico que se ha introducido en la vejiga.   Una vez extrado el tejido de la prstata, se retira el resectoscopio y se coloca una sonda de Foley para facilitar la sanacin y drenar la orina de la vejiga.  Qu ocurre despus de una intervencin de HoLEP?  Ms del 90% de los pacientes se Zenaida Niece a casa el mismo da, unas horas despus de la operacin. Menos del 10% ingresarn al hospital por una noche, para observacin y [address] de la [place], o si tienen otros problemas mdicos.   Se har un lavado del catter con suero fisiolgico aproximadamente por una hora despus de la operacin para eliminar la sangre de la [place]. Es normal que haya algo de sangre en la orina despus de la intervencin. La necesidad de una transfusin de sangre es extremadamente rara.   Una vez que el paciente se haya despertado completamente de la anestesia, se le permite comer y tomar lquidos.   El catter [employer] se retira despus de 2-3 das de la operacin; el paciente ir a la clnica para que le retiren Associate Professor y se asegure que puede orinar por s mismo.   Es muy importante tomar muchos lquidos despus de la intervencin por una semana para mantener la vejiga limpia.   Al principio puede haber algo de ardor al Geographical information systems officer, pero esto generalmente mejora en unas horas o das. La mayora de los pacientes no presentan un dolor intenso, y rara vez se necesitan analgsicos narcticos.  Los sntomas de Geographical information systems officer con frecuencia, con urgencia e incluso de un goteo son NORMALES durante las primeras semanas despus de la [place], ya que la vejiga se adapta despus de haber tenido que trabajar contra el bloqueo de la prstata durante muchos aos. Esto mejorar, pero a veces puede tomar varios meses.   El uso de ejercicios del suelo plvico (ejercicios de Kegel) pueden ayudar a mejorar los problemas de incontinencia  urinaria.   Despus de [employer], los pacientes sern examinados a las 6 semanas y a los 6 meses para revisar los sntomas.   No se deben levantar objetos pesados por lo menos durante 2-3 semanas despus de la intervencin, sin embargo los pacientes pueden caminar y Engineer, manufacturing despus de la intervencin. El regreso al trabajo depende de la ocupacin que tenga.  Cules son las ventajas de la intervencin de HoLEP?   La HoLEP ha sido estudiado en muchas partes diferentes del mundo y ha demostrado ser un procedimiento seguro y Engineer, manufacturing. Aunque hay muchos tipos de operaciones de BPH disponibles, la HoLEP ofrece una ventaja nica al poder extraer Neomia Dear gran cantidad de tejido sin ninguna incisin en el cuerpo, incluso en prstatas muy grandes, al [employer] el riesgo de hemorragia y proporciona tejido para Electronics engineer (para Risk analyst). Esto disminuye la necesidad de transfusiones de sangre durante la intervencin, minimiza la estada en el hospital y reduce el riesgo de tener que repetir el tratamiento.  Cules son los efectos secundarios de la intervencin de HoLEP?   Ardor y sangrado temporal al [employer]. Puede verse algo de Retail buyer en la orina durante algunas semanas despus de la intervencin y es parte del proceso de sanacin.   Es de Youth worker que todos los pacientes presenten incontinencia urinaria (incapacidad para controlar el flujo de la orina) inmediatamente despus de la intervencin y debern usar protectores durante los primeros das/semanas. Esto generalmente mejora en el transcurso de varias semanas. La prctica de los ejercicios Kegel puede ayudar a disminuir el goteo por movimientos de esfuerzo como toser, estornudar o Astronomer. El porcentaje de pacientes con goteo a largo plazo es muy bajo. Los pacientes tambin pueden presentar goteos con [place] y esto puede tratarse con medicamentos. El riesgo de incontinencia con urgencia  puede depender de varios factores como la edad, el tamao de la prstata, los sntomas y otros problemas mdicos.   Eyaculacin retrgrada o "eyaculacin hacia atrs." En el 75% de los casos, el paciente no ver ningn lquido durante la eyaculacin despus de la intervencin.   La funcin erctil no suele verse afectada de forma considerable.  Cules son los riesgos de la intervencin de HoLEP?   Lesin de la uretra o desarrollo de tejido de Editor, commissioning en el futuro   Lesin de la cpsula de la prstata (normalmente tratada con un cateterismo ms largo).   Lesin de la vejiga o de los orificios ureterales (por donde Comptroller orina del rin)   Infeccin de la vejiga, los testculos o los riones   Reaparicin de la obstruccin urinaria posteriormente que requiere otra intervencin (<2%)   Necesidad de transfusin de [place] u otra intervencin debido a una hemorragia   Falta de alivio de todos los sntomas y/o necesidad de cateterismo prolongado despus de la  ciruga   En un 5-15% de los pacientes se Clinical research associate un cncer de prstata no diagnosticado  previamente en la [place]. El cncer de prstata puede tratarse despus de la HoLEP.   Riesgos  habituales de la anestesia, como cogulos de [place], [place], Catering manager.  Cundo debo llamar a mi mdico?   Si presenta fiebre de ms de 101.3 grados   Incapacidad para orinar, o grandes cogulos de sangre en la orina  Holmium Laser Enucleation of the Prostate (HoLEP)  HoLEP is a treatment for men with benign prostatic hyperplasia (BPH). The laser surgery removed blockages of urine flow, and is done without any incisions on the body.     What is HoLEP?  HoLEP is a type of laser surgery used to treat obstruction (blockage) of urine flow as a result of benign prostatic hyperplasia (BPH). In men with BPH, the prostate gland is not cancerous, but has become enlarged. An enlarged prostate can result in a number of urinary tract  symptoms such as weak urinary stream, difficulty in starting urination, inability to urinate, frequent urination, or getting up at night to urinate.  HoLEP was developed in the 1990's as a more effective and less expensive surgical option for BPH, compared to other surgical options such as laser vaporization(PVP/greenlight laser), transurethral resection of the prostate(TURP), and open simple prostatectomy.   What happens during a HoLEP?  HoLEP requires general anesthesia ("asleep" throughout the procedure).   An antibiotic is given to reduce the risk of infection  A surgical instrument called a resectoscope is inserted through the urethra (the tube that carries urine from the bladder). The resectoscope has a camera that allows the surgeon to view the internal structure of the prostate gland, and to see where the incisions are being made during surgery.  The laser is inserted into the resectoscope and is used to enucleate (free up) the enlarged prostate tissue from the capsule (outer shell) and then to seal up any blood vessels. The tissue that has been removed is pushed back into the bladder.  A morcellator is placed through the resectoscope, and is used to suction out the prostate tissue that has been pushed into the bladder.  When the prostate tissue has been removed, the resectoscope is removed, and a foley catheter is placed to allow healing and drain the urine from the bladder.     What happens after a HoLEP?  More than 90% of patients go home the same day a few hours after surgery. Less than 10% will be admitted to the hospital overnight for observation to monitor the urine, or if they have other medical problems.  Fluid is flushed through the catheter for about 1 hour after surgery to clear any blood from the urine. It is normal to have some blood in the urine after surgery. The need for blood transfusion is extremely rare.  Eating and drinking are permitted after the procedure once  the patient has fully awakened from anesthesia.  The catheter is usually removed 2-3 days after surgery- the patient will come to clinic to have the catheter removed and make sure they can urinate on their own.  It is very important to drink lots of fluids after surgery for one week to keep the bladder flushed.  At first, there may be some burning with urination, but this typically improved within a few hours to days. Most patients do not have a significant amount of pain, and narcotic pain medications are rarely needed.  Symptoms of urinary frequency, urgency, and even leakage are NORMAL for the first few weeks after surgery as the bladder adjusts after having to work hard against blockage from the prostate for many years. This will improve, but can sometimes  take several months.  The use of pelvic floor exercises (Kegel exercises) can help improve problems with urinary incontinence.   After catheter removal, patients will be seen at 6 weeks and 6 months for symptom check  No heavy lifting for at least 2-3 weeks after surgery, however patients can walk and do light activities the first day after surgery. Return to work time depends on occupation.    What are the advantages of HoLEP?  HoLEP has been studied in many different parts of the world and has been shown to be a safe and effective procedure. Although there are many types of BPH surgeries available, HoLEP offers a unique advantage in being able to remove a large amount of tissue without any incisions on the body, even in very large prostates, while decreasing the risk of bleeding and providing tissue for pathology (to look for cancer). This decreases the need for blood transfusions during surgery, minimizes hospital stay, and reduces the risk of needing repeat treatment.  What are the side effects of HoLEP?  Temporary burning and bleeding during urination. Some blood may be seen in the urine for weeks after surgery and is part of the  healing process.  Urinary incontinence (inability to control urine flow) is expected in all patients immediately after surgery and they should wear pads for the first few days/weeks. This typically improves over the course of several weeks. Performing Kegel exercises can help decrease leakage from stress maneuvers such as coughing, sneezing, or lifting. The rate of long term leakage is very low. Patients may also have leakage with urgency and this may be treated with medication. The risk of urge incontinence can be dependent on several factors including age, prostate size, symptoms, and other medical problems.  Retrograde ejaculation or "backwards ejaculation." In 75% of cases, the patient will not see any fluid during ejaculation after surgery.  Erectile function is generally not significantly affected.   What are the risks of HoLEP?  Injury to the urethra or development of scar tissue at a later date  Injury to the capsule of the prostate (typically treated with longer catheterization).  Injury to the bladder or ureteral orifices (where the urine from the kidney drains out)  Infection of the bladder, testes, or kidneys  Return of urinary obstruction at a later date requiring another operation (<2%)  Need for blood transfusion or re-operation due to bleeding  Failure to relieve all symptoms and/or need for prolonged catheterization after surgery  5-15% of patients are found to have previously undiagnosed prostate cancer in their specimen. Prostate cancer can be treated after HoLEP.  Standard risks of anesthesia including blood clots, heart attacks, etc  When should I call my doctor?  Fever over 101.3 degrees  Inability to urinate, or large blood clots in the urine

## 2023-04-13 NOTE — Progress Notes (Signed)
   04/13/2023 4:01 PM   [name] 1951-09-12 [id number]  Reason for visit: Follow up BPH, retention, history of elevated PSA  HPI: 72 year old male who I originally met in March 2024 for long history of BPH, recurrent retention and mildly elevated PSA that have been chronically elevated ranging from 7-8 over the last 10 years with a negative prostate biopsy previously in [place] that reportedly showed a significantly enlarged prostate.  He was on maximal medical therapy with Flomax and finasteride, but deferred further evaluation for consideration of outlet procedures.  He has had retention at least 5 times requiring catheter, as well as a suprapubic tube in [place] at some point.  No prior cross-sectional imaging to review, has retrograde ejaculation on the Flomax.  Recently seen in the ER on 04/08/2023 with urinary retention, ER unable to place Foley and Dr. Lonna Cobb ultimately placed a 14 [place] Coloplast catheter.  He presents today to discuss bladder management options.  I do very frank conversation with the patient that I strongly feel HOLEP would be his best option for his recurrent urinary retention likely secondary to BPH.  Prior to finalizing a treatment plan, need to move forward with cystoscopy/TRUS for better evaluation of urethral/bladder anatomy, bladder stones, and prostate volume.  We briefly reviewed other options like UroLift, TURP, PAE, and we will discuss in more detail after cystoscopy/TRUS findings if those are feasible options.  We discussed the risks and benefits of HoLEP at length.  The procedure requires general anesthesia and takes 1 to 2 hours, and a holmium laser is used to enucleate the prostate and push this tissue into the bladder.  A morcellator is then used to remove this tissue, which is sent for pathology.  The vast majority(>95%) of patients are able to discharge the same day with a catheter in place for 2 to 3 days, and will follow-up in clinic for a  voiding trial.  We specifically discussed the risks of bleeding, infection, retrograde ejaculation, temporary urgency and urge incontinence, very low risk of long-term incontinence, urethral stricture/bladder neck contracture, pathologic evaluation of prostate tissue and possible detection of prostate cancer or other malignancy, and possible need for additional procedures.  Okay to proceed with voiding trial as scheduled with Michiel Cowboy, PA, though high risk for failure/recurrent retention Schedule cystoscopy/TRUS ASAP with anticipation of scheduling HOLEP in the near future  I spent 45 total minutes on the day of the encounter including pre-visit review of the medical record, face-to-face time with the patient, and post visit ordering of labs/imaging/tests.  Extensive review of ER notes, patient had numerous questions about HOLEP and options for bladder management.   Sondra Come, MD  [facility] [address], [address] [place], [place] [address] [phone]

## 2023-04-18 NOTE — Progress Notes (Deleted)
04/19/2023 8:21 AM   Matthew Jordan 04/06/1951 [id number]  Referring provider: Debera Lat, PA-C [address] [place],  [place] [address]  Urological history: 1. BPH with retention -tamsulosin 0.4 mg daily  -tadalafil 5 mg daily -currently managed with indwelling Foley  2. Elevated PSA -PSA (12/2022) 8.6 -prostate biopsy in [place] ~ 8 years ago  3. Erectile dysfunction -contributing factors of BPH, age, CAD, hypertension, sleep apnea, congestive heart failure, alcohol consumption and history of smoking -Tadalafil 5 mg daily  No chief complaint on file.   HPI: Matthew Jordan is a 72 y.o. male who presents today for trial of void.    Previous records reviewed.   He has had several instances of urinary retention and was last seen by Dr. Richardo Hanks on Apr 13, 2023.  At that appointment, it was discussed that a bladder outlet procedure would be beneficial for him and that the HoLEP would be his best option.  He is scheduled for cystoscopy/TRUS with Dr. Richardo Hanks on June 6.  Foley catheter is removed this a.m.  He is instructed to drink enough fluids to be well-hydrated and to give his bladder an adequate challenge.  PMH: Past Medical History:  Diagnosis Date   Arthritis    BPH with urinary obstruction    CAD (coronary artery disease) Fall 2014   40% stenosis of LAD on cardiac cath   CAD (coronary artery disease)    Cardiac arrhythmia    Cardiac dysrhythmia    Chest pain    CHF (congestive heart failure) (HCC)    Dysphagia    ED (erectile dysfunction)    Elevated PSA    GERD (gastroesophageal reflux disease)    Hypertension    Irregular heart beat    Left ventricular hypertrophy Fall 2014   mild concentric   Prostate finding    Sleep apnea    SOB (shortness of breath)    SOB (shortness of breath)     Surgical History: Past Surgical History:  Procedure Laterality Date   CARDIAC CATHETERIZATION  2014   [place]   HERNIA REPAIR      Home  Medications:  Allergies as of 04/19/2023       Reactions   Amoxicillin Other (See Comments)        Medication List        Accurate as of April 18, 2023  8:21 AM. If you have any questions, ask your nurse or doctor.          apixaban 5 MG Tabs tablet Commonly known as: ELIQUIS Take 1 tablet (5 mg total) by mouth 2 (two) times daily.   cephALEXin 500 MG capsule Commonly known as: KEFLEX Take 1 capsule (500 mg total) by mouth 2 (two) times daily.   clotrimazole-betamethasone cream Commonly known as: Lotrisone Apply 1 Application topically 2 (two) times daily.   diltiazem 240 MG 24 hr capsule Commonly known as: CARDIZEM CD Take 1 capsule (240 mg total) by mouth daily.   furosemide 40 MG tablet Commonly known as: LASIX TAKE 1 TABLET BY MOUTH DAILY AS NEEDED   lisinopril-hydrochlorothiazide 10-12.5 MG tablet Commonly known as: ZESTORETIC Take 1 tablet by mouth daily.   Rocklatan 0.02-0.005 % Soln Generic drug: Netarsudil-Latanoprost Apply 1 drop to eye at bedtime.   tadalafil 5 MG tablet Commonly known as: CIALIS Take 1-2 tablets (5-10 mg total) by mouth daily as needed for erectile dysfunction (take 1 hour prior to sexual activity).   tamsulosin 0.4 MG Caps capsule  Commonly known as: FLOMAX Take 1 capsule (0.4 mg total) by mouth daily.   terbinafine 250 MG tablet Commonly known as: LamISIL Take 1 tablet (250 mg total) by mouth daily.   traMADol 50 MG tablet Commonly known as: Ultram Take 0.5-1 tablets (25-50 mg total) by mouth every 12 (twelve) hours as needed for moderate pain.        Allergies:  Allergies  Allergen Reactions   Amoxicillin Other (See Comments)    Family History: Family History  Problem Relation Age of Onset   Hypertension Mother    Lung disease Mother    Hypertension Father    Cancer Father        possible prostate cancer   Kidney disease Neg Hx     Social History:  reports that he quit smoking about 42 years ago. His  smoking use included cigarettes. He has a 2.50 pack-year smoking history. He has been exposed to tobacco smoke. He has never used smokeless tobacco. He reports that he does not currently use alcohol. He reports that he does not use drugs.  ROS: Pertinent ROS in HPI  Physical Exam: There were no vitals taken for this visit.  Constitutional:  Well nourished. Alert and oriented, No acute distress. HEENT: [place] AT, moist mucus membranes.  Trachea midline, no masses. Cardiovascular: No clubbing, cyanosis, or edema. Respiratory: Normal respiratory effort, no increased work of breathing. GI: Abdomen is soft, non tender, non distended, no abdominal masses. Liver and spleen not palpable.  No hernias appreciated.  Stool sample for occult testing is not indicated.   GU: No CVA tenderness.  No bladder fullness or masses.  Patient with circumcised/uncircumcised phallus. ***Foreskin easily retracted***  Urethral meatus is patent.  No penile discharge. No penile lesions or rashes. Scrotum without lesions, cysts, rashes and/or edema.  Testicles are located scrotally bilaterally. No masses are appreciated in the testicles. Left and right epididymis are normal. Rectal: Patient with  normal sphincter tone. Anus and perineum without scarring or rashes. No rectal masses are appreciated. Prostate is approximately *** grams, *** nodules are appreciated. Seminal vesicles are normal. Skin: No rashes, bruises or suspicious lesions. Lymph: No cervical or inguinal adenopathy. Neurologic: Grossly intact, no focal deficits, moving all 4 extremities. Psychiatric: Normal mood and affect.  Laboratory Data: Lab Results  Component Value Date   WBC 6.3 04/08/2023   HGB 15.5 04/08/2023   HCT 44.5 04/08/2023   MCV 90.3 04/08/2023   PLT 217 04/08/2023    Lab Results  Component Value Date   CREATININE 0.77 04/08/2023    Lab Results  Component Value Date   HGBA1C 5.9 (H) 03/17/2023    Lab Results  Component Value Date    TSH 2.660 03/17/2023       Component Value Date/Time   CHOL 171 03/17/2023 0838   HDL 33 (L) 03/17/2023 0838   CHOLHDL 5.2 (H) 03/17/2023 0838   LDLCALC 112 (H) 03/17/2023 0838    Lab Results  Component Value Date   AST 19 03/17/2023   Lab Results  Component Value Date   ALT 20 03/17/2023    Urinalysis  See EPIC and HPI I have reviewed the labs.   Pertinent Imaging: ***   Assessment & Plan:    1. BPH with retention -UA *** -PVR *** -cysto/TRUS scheduled for next week  No follow-ups on file.  These notes generated with voice recognition software. I apologize for typographical errors.  Cloretta Ned  [facility] [address]  [address]  [address] [place], [place] [address] [phone]

## 2023-04-19 ENCOUNTER — Ambulatory Visit: Payer: Medicare HMO | Admitting: Urology

## 2023-04-19 DIAGNOSIS — N138 Other obstructive and reflux uropathy: Secondary | ICD-10-CM

## 2023-04-19 DIAGNOSIS — R338 Other retention of urine: Secondary | ICD-10-CM

## 2023-04-21 ENCOUNTER — Other Ambulatory Visit: Payer: Self-pay | Admitting: Urology

## 2023-04-21 ENCOUNTER — Ambulatory Visit (INDEPENDENT_AMBULATORY_CARE_PROVIDER_SITE_OTHER): Payer: Medicare HMO | Admitting: Urology

## 2023-04-21 ENCOUNTER — Encounter: Payer: Self-pay | Admitting: Urology

## 2023-04-21 VITALS — BP 101/68 | HR 84 | Ht 70.0 in | Wt 244.0 lb

## 2023-04-21 DIAGNOSIS — R339 Retention of urine, unspecified: Secondary | ICD-10-CM

## 2023-04-21 DIAGNOSIS — N401 Enlarged prostate with lower urinary tract symptoms: Secondary | ICD-10-CM

## 2023-04-21 DIAGNOSIS — N138 Other obstructive and reflux uropathy: Secondary | ICD-10-CM

## 2023-04-21 MED ORDER — NYSTATIN 100000 UNIT/GM EX POWD: 1.0000 | Freq: Three times a day (TID) | CUTANEOUS | 0 refills | Status: DC

## 2023-04-21 NOTE — Patient Instructions (Signed)
Holmium Laser Enucleation of the Prostate (HoLEP)  HoLEP is a treatment for men with benign prostatic hyperplasia (BPH). The laser surgery removed blockages of urine flow, and is done without any incisions on the body.     What is HoLEP?  HoLEP is a type of laser surgery used to treat obstruction (blockage) of urine flow as a result of benign prostatic hyperplasia (BPH). In men with BPH, the prostate gland is not cancerous, but has become enlarged. An enlarged prostate can result in a number of urinary tract symptoms such as weak urinary stream, difficulty in starting urination, inability to urinate, frequent urination, or getting up at night to urinate.  HoLEP was developed in the 1990's as a more effective and less expensive surgical option for BPH, compared to other surgical options such as laser vaporization(PVP/greenlight laser), transurethral resection of the prostate(TURP), and open simple prostatectomy.   What happens during a HoLEP?  HoLEP requires general anesthesia ("asleep" throughout the procedure).   An antibiotic is given to reduce the risk of infection  A surgical instrument called a resectoscope is inserted through the urethra (the tube that carries urine from the bladder). The resectoscope has a camera that allows the surgeon to view the internal structure of the prostate gland, and to see where the incisions are being made during surgery.  The laser is inserted into the resectoscope and is used to enucleate (free up) the enlarged prostate tissue from the capsule (outer shell) and then to seal up any blood vessels. The tissue that has been removed is pushed back into the bladder.  A morcellator is placed through the resectoscope, and is used to suction out the prostate tissue that has been pushed into the bladder.  When the prostate tissue has been removed, the resectoscope is removed, and a foley catheter is placed to allow healing and drain the urine from the  bladder.     What happens after a HoLEP?  More than 90% of patients go home the same day a few hours after surgery. Less than 10% will be admitted to the hospital overnight for observation to monitor the urine, or if they have other medical problems.  Fluid is flushed through the catheter for about 1 hour after surgery to clear any blood from the urine. It is normal to have some blood in the urine after surgery. The need for blood transfusion is extremely rare.  Eating and drinking are permitted after the procedure once the patient has fully awakened from anesthesia.  The catheter is usually removed 2-3 days after surgery- the patient will come to clinic to have the catheter removed and make sure they can urinate on their own.  It is very important to drink lots of fluids after surgery for one week to keep the bladder flushed.  At first, there may be some burning with urination, but this typically improved within a few hours to days. Most patients do not have a significant amount of pain, and narcotic pain medications are rarely needed.  Symptoms of urinary frequency, urgency, and even leakage are NORMAL for the first few weeks after surgery as the bladder adjusts after having to work hard against blockage from the prostate for many years. This will improve, but can sometimes take several months.  The use of pelvic floor exercises (Kegel exercises) can help improve problems with urinary incontinence.   After catheter removal, patients will be seen at 6 weeks and 6 months for symptom check  No heavy lifting for  at least 2-3 weeks after surgery, however patients can walk and do light activities the first day after surgery. Return to work time depends on occupation.    What are the advantages of HoLEP?  HoLEP has been studied in many different parts of the world and has been shown to be a safe and effective procedure. Although there are many types of BPH surgeries available, HoLEP offers a  unique advantage in being able to remove a large amount of tissue without any incisions on the body, even in very large prostates, while decreasing the risk of bleeding and providing tissue for pathology (to look for cancer). This decreases the need for blood transfusions during surgery, minimizes hospital stay, and reduces the risk of needing repeat treatment.  What are the side effects of HoLEP?  Temporary burning and bleeding during urination. Some blood may be seen in the urine for weeks after surgery and is part of the healing process.  Urinary incontinence (inability to control urine flow) is expected in all patients immediately after surgery and they should wear pads for the first few days/weeks. This typically improves over the course of several weeks. Performing Kegel exercises can help decrease leakage from stress maneuvers such as coughing, sneezing, or lifting. The rate of long term leakage is very low. Patients may also have leakage with urgency and this may be treated with medication. The risk of urge incontinence can be dependent on several factors including age, prostate size, symptoms, and other medical problems.  Retrograde ejaculation or "backwards ejaculation." In 75% of cases, the patient will not see any fluid during ejaculation after surgery.  Erectile function is generally not significantly affected.   What are the risks of HoLEP?  Injury to the urethra or development of scar tissue at a later date  Injury to the capsule of the prostate (typically treated with longer catheterization).  Injury to the bladder or ureteral orifices (where the urine from the kidney drains out)  Infection of the bladder, testes, or kidneys  Return of urinary obstruction at a later date requiring another operation (<2%)  Need for blood transfusion or re-operation due to bleeding  Failure to relieve all symptoms and/or need for prolonged catheterization after surgery  5-15% of patients are  found to have previously undiagnosed prostate cancer in their specimen. Prostate cancer can be treated after HoLEP.  Standard risks of anesthesia including blood clots, heart attacks, etc  When should I call my doctor?  Fever over 101.3 degrees  Inability to urinate, or large blood clots in the urine   Enucleacin de la prstata con lser de Holmio (HoLEP)  Holmium Laser Enucleation of the Prostate (HoLEP)  La HoLEP es un tratamiento para los hombres con hiperplasia prosttica benigna (BPH, por sus siglas en ingls). La intervencin con lser elimina las obstrucciones del flujo de la [place] y se hace sin ninguna incisin en el cuerpo.  Qu es la HoLEP?  La HoLEP es un tipo de intervencin con lser que se [place] para tratar la obstruccin (bloqueo) del flujo de la orina como resultado de la hiperplasia prosttica benigna (BPH). En los hombres con BPH, la glndula prosttica no es cancerosa, [place] se ha aumentado de [place]. El aumento de tamao de la prstata puede provocar una serie de sntomas en las vas urinarias, tales como un chorro de orina escaso, dificultad para Corporate investment banker a Geographical information systems officer, incapacidad para Geographical information systems officer, Geographical information systems officer con frecuencia o levantarse por la noche para Geographical information systems officer.  La HoLEP se desarroll en la dcada  de los 1990's como una opcin quirrgica ms eficaz y menos costosa para la BPH, en comparacin con otras opciones quirrgicas tales como la vaporizacin (PVP/lser de luz verde), la extirpacin transuretral de la prstata (TURP) y la prostatectoma abierta simple.  Qu ocurre durante una intervencin de HoLEP?   La HoLEP requiere anestesia general (el paciente est "dormido" durante el procedimiento).   Se le da un antibitico para reducir el riesgo de infeccin.   Se introduce un instrumento quirrgico llamado resectoscopio a travs de la uretra (el conducto que lleva la orina desde la vejiga). El resectoscopio tiene una cmara que le permite al cirujano ver la estructura  interna de la glndula prosttica y ver por dnde se hacen las incisiones durante la [place].   El lser se inserta en el resectoscopio y se [place] para Pension scheme manager (separar) el tejido prosttico agrandado de la cpsula (cubierta exterior) y luego sellar los vasos sanguneos. El tejido extirpado se introduce en la vejiga.   Se coloca un morcelador a travs del resectoscopio y se [place] para succionar el tejido prosttico que se ha introducido en la vejiga.   Una vez extrado el tejido de la prstata, se retira el resectoscopio y se coloca una sonda de Foley para facilitar la sanacin y drenar la orina de la vejiga.  Qu ocurre despus de una intervencin de HoLEP?  Ms del 90% de los pacientes se Zenaida Niece a casa el mismo da, unas horas despus de la operacin. Menos del 10% ingresarn al hospital por una noche, para observacin y [address] de la [place], o si tienen otros problemas mdicos.   Se har un lavado del catter con suero fisiolgico aproximadamente por una hora despus de la operacin para eliminar la sangre de la [place]. Es normal que haya algo de sangre en la orina despus de la intervencin. La necesidad de una transfusin de sangre es extremadamente rara.   Una vez que el paciente se haya despertado completamente de la anestesia, se le permite comer y tomar lquidos.   El catter [employer] se retira despus de 2-3 das de la operacin; el paciente ir a la clnica para que le retiren Associate Professor y se asegure que puede orinar por s mismo.   Es muy importante tomar muchos lquidos despus de la intervencin por una semana para mantener la vejiga limpia.   Al principio puede haber algo de ardor al Geographical information systems officer, pero esto generalmente mejora en unas horas o das. La mayora de los pacientes no presentan un dolor intenso, y rara vez se necesitan analgsicos narcticos.   Los sntomas de Geographical information systems officer con frecuencia, con urgencia e incluso de un goteo son NORMALES durante las primeras semanas  despus de la [place], ya que la vejiga se adapta despus de haber tenido que trabajar contra el bloqueo de la prstata durante muchos aos. Esto mejorar, pero a veces puede tomar varios meses.   El uso de ejercicios del suelo plvico (ejercicios de Kegel) pueden ayudar a mejorar los problemas de incontinencia urinaria.   Despus de [employer], los pacientes sern examinados a las 6 semanas y a los 6 meses para revisar los sntomas.   No se deben levantar objetos pesados por lo menos durante 2-3 semanas despus de la intervencin, sin embargo los pacientes pueden caminar y Engineer, manufacturing despus de la intervencin. El regreso al trabajo depende de la ocupacin que tenga.  Cules son las ventajas de la intervencin de HoLEP?   La HoLEP ha sido  estudiado en muchas partes diferentes del mundo y ha demostrado ser un procedimiento seguro y Engineer, manufacturing. Aunque hay muchos tipos de operaciones de BPH disponibles, la HoLEP ofrece una ventaja nica al poder extraer Neomia Dear gran cantidad de tejido sin ninguna incisin en el cuerpo, incluso en prstatas muy grandes, al [employer] el riesgo de hemorragia y proporciona tejido para Electronics engineer (para Risk analyst). Esto disminuye la necesidad de transfusiones de sangre durante la intervencin, minimiza la estada en el hospital y reduce el riesgo de tener que repetir el tratamiento.  Cules son los efectos secundarios de la intervencin de HoLEP?   Ardor y sangrado temporal al [employer]. Puede verse algo de Retail buyer en la orina durante algunas semanas despus de la intervencin y es parte del proceso de sanacin.   Es de Youth worker que todos los pacientes presenten incontinencia urinaria (incapacidad para controlar el flujo de la orina) inmediatamente despus de la intervencin y debern usar protectores durante los primeros das/semanas. Esto generalmente mejora en el transcurso de varias semanas. La prctica de los ejercicios  Kegel puede ayudar a disminuir el goteo por movimientos de esfuerzo como toser, estornudar o Astronomer. El porcentaje de pacientes con goteo a largo plazo es muy bajo. Los pacientes tambin pueden presentar goteos con [place] y esto puede tratarse con medicamentos. El riesgo de incontinencia con urgencia puede depender de varios factores como la edad, el tamao de la prstata, los sntomas y otros problemas mdicos.   Eyaculacin retrgrada o "eyaculacin hacia atrs." En el 75% de los casos, el paciente no ver ningn lquido durante la eyaculacin despus de la intervencin.   La funcin erctil no suele verse afectada de forma considerable.  Cules son los riesgos de la intervencin de HoLEP?   Lesin de la uretra o desarrollo de tejido de Editor, commissioning en el futuro   Lesin de la cpsula de la prstata (normalmente tratada con un cateterismo ms largo).   Lesin de la vejiga o de los orificios ureterales (por donde Comptroller orina del rin)   Infeccin de la vejiga, los testculos o los riones   Reaparicin de la obstruccin urinaria posteriormente que requiere otra intervencin (<2%)   Necesidad de transfusin de [place] u otra intervencin debido a una hemorragia   Falta de alivio de todos los sntomas y/o necesidad de cateterismo prolongado despus de la  ciruga   En un 5-15% de los pacientes se Clinical research associate un cncer de prstata no diagnosticado  previamente en la [place]. El cncer de prstata puede tratarse despus de la HoLEP.   Riesgos habituales de la anestesia, como cogulos de [place], [place], Catering manager.  Cundo debo llamar a mi mdico?   Si presenta fiebre de ms de 101.3 grados   Incapacidad para Geographical information systems officer, o grandes cogulos de Retail buyer en la [place]

## 2023-04-21 NOTE — Progress Notes (Signed)
Surgical Physician Order Form [facility]  * Scheduling expectation : Next Available  *Length of Case: 2.5 hours  *Clearance needed: no  *Anticoagulation Instructions: Hold all anticoagulants  *Aspirin Instructions: Hold Aspirin and Plavix  *Post-op visit Date/Instructions:  1-3 day cath removal  *Diagnosis: BPH w/urinary obstruction  *Procedure:     HOLEP (16109)   Additional orders: N/A  -Admit type: OUTpatient  -Anesthesia: General  -VTE Prophylaxis Standing Order SCD's       Other:   -Standing Lab Orders Per Anesthesia    Lab other: UA&Urine Culture  -Standing Test orders EKG/Chest x-ray per Anesthesia       Test other:   - Medications:  Cipro 400mg  IV  -Other orders:  N/A

## 2023-04-21 NOTE — Progress Notes (Signed)
Cystoscopy Procedure Note:  Indication: BPH and urinary retention  72 year old male who I originally met in March 2024 for long history of BPH, recurrent retention and mildly elevated PSA that have been chronically elevated ranging from 7-8 over the last 10 years with a negative prostate biopsy previously in [place] that reportedly showed a significantly enlarged prostate.  He was on maximal medical therapy with Flomax and finasteride, but deferred further evaluation for consideration of outlet procedures.  He has had retention at least 5 times requiring catheter, as well as a suprapubic tube in [place] at some point.  Recurrent retention most recently on 04/08/2023, ER unable to place Foley and Dr. Lonna Cobb placed a 14 [place] Coloplast catheter.  After informed consent and discussion of the procedure and its risks, Matthew Jordan was positioned and prepped in the standard fashion. Cystoscopy was performed with a flexible cystoscope. The urethra, bladder neck and entire bladder was visualized in a standard fashion. The prostate was massive with a high bladder neck and a large median lobe.  Mild catheter cystitis at posterior wall.  Moderate to severe trabeculations, retroflexion with large median lobe intravesical portion of the prostate.  A Super Stiff wire was advanced into the bladder, and an 61 Psychiatrist advanced easily over the wire with return of light pink urine, 10ml were placed in the balloon.  Catheter was connected to drainage and secured to the thigh  Imaging: The transrectal ultrasound device was inserted into the rectum for a calculated prostate volume of 213g  Findings: Massive 213 g prostate  -----------------------------------------------------------------------------------------------------------------------------  Assessment and Plan: 72 year old male with long history of BPH with recurrent retention 6+ times requiring Foley catheter, suprapubic tube in [place], and  most recently May 2024 requiring a 14 [place] Coloplast catheter placed by Dr. Lonna Cobb.  Also has elevated PSA ranging from 7-8 over the last 10 years that has been stable, and prior prostate biopsy was negative.  Suspect BPH as etiology of his elevated PSA.  With his recurrent retention I strongly recommended outlet procedure.  With his very large gland, HOLEP would certainly be his best option.  We discussed the risks and benefits of HoLEP at length.  The procedure requires general anesthesia and takes 1 to 2 hours, and a holmium laser is used to enucleate the prostate and push this tissue into the bladder.  A morcellator is then used to remove this tissue, which is sent for pathology.  The vast majority(>95%) of patients are able to discharge the same day with a catheter in place for 2 to 3 days, and will follow-up in clinic for a voiding trial.  We specifically discussed the risks of bleeding, infection, retrograde ejaculation, temporary urgency and urge incontinence, very low risk of long-term incontinence, urethral stricture/bladder neck contracture, pathologic evaluation of prostate tissue and possible detection of prostate cancer or other malignancy, and possible need for additional procedures.  Schedule HOLEP  [name], MD 04/21/2023

## 2023-04-22 ENCOUNTER — Telehealth: Payer: Self-pay

## 2023-04-22 NOTE — Telephone Encounter (Signed)
PA initiated via covermymeds for nystatin powder, awaiting response.

## 2023-04-25 ENCOUNTER — Telehealth: Payer: Self-pay

## 2023-04-25 NOTE — Telephone Encounter (Signed)
Called pt's home number no answer.   Called pt's mobile number pt's daughter answers and states that pt is not with her currently. She states she will have pt return call.

## 2023-04-25 NOTE — Progress Notes (Signed)
Spoke with pt. Earlier today. States that at this time he is not interested in having surgery and always feels like he should get a second opinion to make sure that is his best option. I told him to call back if he was interested in being placed on the surgery grid. Patient verbalized understanding.

## 2023-04-25 NOTE — Telephone Encounter (Signed)
Spoke with pt. Pt. Does not want to proceed with surgery, states that he is looking in to a second opinion just to make sure. Reports that he would like to have his catheter out and would like to speak with Dr. Richardo Hanks nurse to ask questions about his medications. I told him I would send this to Dr. Kathlee Nations team.

## 2023-04-27 ENCOUNTER — Other Ambulatory Visit: Payer: Self-pay

## 2023-04-27 ENCOUNTER — Emergency Department
Admission: EM | Admit: 2023-04-27 | Discharge: 2023-04-27 | Disposition: A | Discharge status: Home / Self Care | Payer: Medicare HMO | Attending: Emergency Medicine | Admitting: Emergency Medicine

## 2023-04-27 ENCOUNTER — Telehealth: Payer: Self-pay

## 2023-04-27 ENCOUNTER — Ambulatory Visit: Payer: Self-pay | Admitting: *Deleted

## 2023-04-27 ENCOUNTER — Telehealth: Payer: Self-pay | Admitting: *Deleted

## 2023-04-27 DIAGNOSIS — I110 Hypertensive heart disease with heart failure: Secondary | ICD-10-CM | POA: Diagnosis not present

## 2023-04-27 DIAGNOSIS — B49 Unspecified mycosis: Secondary | ICD-10-CM | POA: Insufficient documentation

## 2023-04-27 DIAGNOSIS — I509 Heart failure, unspecified: Secondary | ICD-10-CM | POA: Diagnosis not present

## 2023-04-27 DIAGNOSIS — Z1211 Encounter for screening for malignant neoplasm of colon: Secondary | ICD-10-CM

## 2023-04-27 DIAGNOSIS — J029 Acute pharyngitis, unspecified: Secondary | ICD-10-CM | POA: Insufficient documentation

## 2023-04-27 DIAGNOSIS — I2510 Atherosclerotic heart disease of native coronary artery without angina pectoris: Secondary | ICD-10-CM | POA: Insufficient documentation

## 2023-04-27 LAB — GROUP A STREP BY PCR: Group A Strep by PCR: NOT DETECTED

## 2023-04-27 MED ORDER — CLOTRIMAZOLE 1 % EX CREA: 1.0000 | TOPICAL_CREAM | Freq: Two times a day (BID) | CUTANEOUS | 0 refills | Status: DC

## 2023-04-27 NOTE — Telephone Encounter (Signed)
Patient with diagnosis of PAF(paroxysmal atrial fibrillation)  on Apixaban for anticoagulation.    Procedure: COLONOSCOPY  Date of procedure: TBD   CHA2DS2-VASc Score = 4   This indicates a 4.8% annual risk of stroke. The patient's score is based upon: CHF History: 1 HTN History: 1 Diabetes History: 0 Stroke History: 0 Vascular Disease History: 1 Age Score: 1 Gender Score: 0   CrCl >100 mL/min Platelet count 217 K (04/08/2023)    Per office protocol, patient can hold Eliquis for 2 days prior to procedure.     **This guidance is not considered finalized until pre-operative APP has relayed final recommendations.**

## 2023-04-27 NOTE — Discharge Instructions (Addendum)
You may use the cream to help with your rash which appears to be fungal.  Please follow-up with your urologist for further management.  Please return for any new, worsening, or change in symptoms or other concerns.  It was a pleasure caring for you today.

## 2023-04-27 NOTE — ED Notes (Signed)
Patient declined discharge vital signs. 

## 2023-04-27 NOTE — Telephone Encounter (Signed)
-----   Message from Levi Aland, NP sent at 04/27/2023  1:51 PM EDT ----- Regarding: FW: Colonoscopy To Be Scheduled After Cardiac Clearance and Med Advice Received  ----- Message ----- From: Avie Arenas, CMA Sent: 04/27/2023   1:31 PM EDT To: Avie Arenas, CMA; Cv Div Preop Subject: Colonoscopy To Be Scheduled After Cardiac Cl#     [facility] Pre-operative Risk Assessment   Request for surgical clearance: YES  What type of surgery is being performed? Colonoscopy   When is this surgery scheduled? Date to be scheduled after clearance has been granted.   Are there any medications that need to be held prior to surgery and how long?YES Eliquis   Practice name and name of physician performing surgery? [facility]   What is your office phone and fax number? [phone]   Anesthesia type (None, local, MAC, general) ? General   Avie Arenas 04/27/2023, 1:28 PM  _________________________________________________________________   (provider comments below)

## 2023-04-27 NOTE — ED Triage Notes (Signed)
Patient arrived by EMS for c/o sore throat and difficulty swallowing. Patient states he has a lump on left side of throat.Patient is able to handle oral secretions and is drinking from a water bottle. Patient also states his Foley catheter is leaking and patient also c/o rash on buttocks.

## 2023-04-27 NOTE — Telephone Encounter (Signed)
   Pre-operative Risk Assessment    Patient Name: Matthew Jordan  DOB: 31-Dec-1950 MRN: [id number]      Request for Surgical Clearance    Procedure:   COLONOSCOPY  Date of Surgery:  Clearance TBD                                 Surgeon:  NOT LISTED Surgeon's Group or Practice Name:  [facility] Phone number:  [phone] Fax number:  [phone]   Type of Clearance Requested:   - Medical  - Pharmacy:  Hold Apixaban (Eliquis)     Type of Anesthesia:  General    Additional requests/questions:    [name]   04/27/2023, 1:52 PM

## 2023-04-27 NOTE — Telephone Encounter (Signed)
Patient's daughter, Jeanice Lim, has called- patient did go to ED- he is currently being treated for possible pharyngitis- fungal infection of skin Daughter states patient has infection on genital area, inner thigh and back of leg, left shoulder.   Daughter is concerned that he may need other medications- requesting office follow up. HH is still being requested- patient is blind and will not let daughter help apply medications given.

## 2023-04-27 NOTE — ED Provider Notes (Signed)
[facility] Provider Note    Event Date/Time   First MD Initiated Contact with Patient 04/27/23 [phone]     (approximate)   History   Sore Throat   HPI  Matthew Jordan is a 72 y.o. male with a history of BPH, recurrent retention, currently with an indwelling Foley who presents today with several complaints.  Patient reports that he awoke with a sore throat this morning.  He is able to drink and eat, no difficulty swallowing.  He has not noticed any swelling or voice changes.  He has not had any drooling.  He is unsure of any sick contacts.  Secondly, patient reports that he is developed an itchy rash to his inner thighs.  He applied rubbing alcohol in this area but it did not help.  He denies any fevers or chills.  The rash is not painful.  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   History of inguinal hernia repair, bilateral 04/06/2019   Behavior concern 10/08/2015   BPH with obstruction/lower urinary tract symptoms 10/07/2015   Varicose veins 10/03/2015   CAD (coronary artery disease)    Arthritis    GERD (gastroesophageal reflux disease)    Elevated PSA    Left ventricular hypertrophy    Cardiac dysrhythmia    Dysphagia    ED (erectile dysfunction)    Essential hypertension 01/02/2015   Abnormal ECG 01/02/2015          Physical Exam   Triage Vital Signs: ED Triage Vitals  Enc Vitals Group     BP 04/27/23 0932 (!) 136/97     Pulse Rate 04/27/23 0932 65     Resp 04/27/23 0932 16     Temp 04/27/23 0932 97.8 F (36.6 C)     Temp Source 04/27/23 0932 Oral     SpO2 04/27/23 0932 93 %     Weight 04/27/23 0933 250 lb (113.4 kg)     Height 04/27/23 0933 5\' 10"   (1.778 m)     Head Circumference --      Peak Flow --      Pain Score 04/27/23 0933 0     Pain Loc --      Pain Edu? --      Excl. in GC? --     Most recent vital signs: Vitals:   04/27/23 0932  BP: (!) 136/97  Pulse: 65  Resp: 16  Temp: 97.8 F (36.6 C)  SpO2: 93%    Physical Exam Vitals and nursing note reviewed.  Constitutional:      General: Awake and alert. No acute distress.    Appearance: Normal appearance. The patient is obese.  HENT:     Head: Normocephalic and atraumatic.     Mouth: Mucous membranes are moist.  Eyes:     General: PERRL. Normal EOMs        Right eye: No discharge.        Left eye: No discharge.     Conjunctiva/sclera: Conjunctivae normal.  Cardiovascular:     Rate and Rhythm: Normal rate and regular rhythm.     Pulses: Normal pulses.     Heart sounds: Normal heart sounds Pulmonary:     Effort: Pulmonary  effort is normal. No respiratory distress.     Breath sounds: Normal breath sounds.  Abdominal:     Abdomen is soft. There is no abdominal tenderness. No rebound or guarding. No distention. Musculoskeletal:        General: No swelling. Normal range of motion.     Cervical back: Normal range of motion and neck supple.  Skin:    General: Skin is warm and dry.     Capillary Refill: Capillary refill takes less than 2 seconds.     Findings: Mildly erythematous rash with satellite lesions noted to inner thighs bilaterally.  No open wounds.  No purulence.  No crepitus.  No induration or swelling noted.  Indwelling Foley present. Neurological:     Mental Status: The patient is awake and alert.      ED Results / Procedures / Treatments   Labs (all labs ordered are listed, but only abnormal results are displayed) Labs Reviewed  GROUP A STREP BY PCR     EKG     RADIOLOGY     PROCEDURES:  Critical Care performed:   Procedures   MEDICATIONS ORDERED IN ED: Medications - No data to display   IMPRESSION / MDM / ASSESSMENT  AND PLAN / ED COURSE  I reviewed the triage vital signs and the nursing notes.   Differential diagnosis includes, but is not limited to, fungal rash, strep pharyngitis, viral pharyngitis.  I reviewed the patient's chart.  Patient saw Dr. Irish Elders on 04/13/2023.  They discussed scheduling cystoscopy/TRUS with anticipation of scheduling a HOLEP in the near future.  Patient is awake and alert, hemodynamically stable and afebrile.  He is nontoxic in appearance.  The rash is nonpainful, though it is very itchy.  It appears to be consistent with fungal infection.  He was started on ointment.  As for his throat, he has mildly erythematous posterior oropharynx.  There is no uvular deviation, no peritonsillar exudate, no trismus, no voice change, no nuchal rigidity, no swelling to his neck, not consistent with peritonsillar or retropharyngeal abscess.  Strep swab obtained in triage.  This was negative for strep pharyngitis.  We discussed symptomatic management and return precautions.  Patient or stands and agrees with plan.  He was discharged in stable condition.  Patient's presentation is most consistent with acute complicated illness / injury requiring diagnostic workup.    FINAL CLINICAL IMPRESSION(S) / ED DIAGNOSES   Final diagnoses:  Viral pharyngitis  Fungal infection     Rx / DC Orders   ED Discharge Orders          Ordered    clotrimazole (CLOTRIMAZOLE ANTI-FUNGAL) 1 % cream  2 times daily        04/27/23 1042             Note:  This document was prepared using Dragon voice recognition software and may include unintentional dictation errors.   Keturah Shavers 04/27/23 1336    Minna Antis, MD 04/27/23 1535

## 2023-04-27 NOTE — Telephone Encounter (Signed)
Gastroenterology Pre-Procedure Review  Request Date: TBD (To be scheduled after cardiac clearance and blood thinner advice has been received) Requesting Physician: Dr. Jodelle Gross  PATIENT REVIEW QUESTIONS: The patient responded to the following health history questions as indicated:    1. Are you having any GI issues? no 2. Do you have a personal history of Polyps? no 3. Do you have a family history of Colon Cancer or Polyps? no 4. Diabetes Mellitus? no 5. Joint replacements in the past 12 months?no 6. Major health problems in the past 3 months?no 7. Any artificial heart valves, MVP, or defibrillator? Cardiac dysrhythmia, CHF, CAD, Left Ventricular Hypertrophy  Cardiac Clearance sent to CV Preop Team    MEDICATIONS & ALLERGIES:    Patient reports the following regarding taking any anticoagulation/antiplatelet therapy:   Plavix, Coumadin, Eliquis, Xarelto, Lovenox, Pradaxa, Brilinta, or Effient? yes (Eliquis prescribed by Dr. Myriam Forehand) Aspirin? no  Patient confirms/reports the following medications:  Current Outpatient Medications  Medication Sig Dispense Refill   apixaban (ELIQUIS) 5 MG TABS tablet Take 1 tablet (5 mg total) by mouth 2 (two) times daily. 60 tablet 3   clotrimazole (CLOTRIMAZOLE ANTI-FUNGAL) 1 % cream Apply 1 Application topically 2 (two) times daily. 30 g 0   clotrimazole-betamethasone (LOTRISONE) cream Apply 1 Application topically 2 (two) times daily. 45 g 3   diltiazem (CARDIZEM CD) 240 MG 24 hr capsule Take 1 capsule (240 mg total) by mouth daily. 90 capsule 3   furosemide (LASIX) 40 MG tablet TAKE 1 TABLET BY MOUTH DAILY AS NEEDED 90 tablet 1   lisinopril-hydrochlorothiazide (ZESTORETIC) 10-12.5 MG tablet Take 1 tablet by mouth daily. 90 tablet 3   nystatin (MYCOSTATIN/NYSTOP) powder Apply 1 Application topically 3 (three) times daily. 15 g 0   ROCKLATAN 0.02-0.005 % SOLN Apply 1 drop to eye at bedtime.     tadalafil (CIALIS) 5 MG tablet Take 1-2 tablets (5-10 mg total) by  mouth daily as needed for erectile dysfunction (take 1 hour prior to sexual activity). 30 tablet 6   tamsulosin (FLOMAX) 0.4 MG CAPS capsule Take 1 capsule (0.4 mg total) by mouth daily. 30 capsule 5   terbinafine (LAMISIL) 250 MG tablet Take 1 tablet (250 mg total) by mouth daily. 90 tablet 0   traMADol (ULTRAM) 50 MG tablet Take 0.5-1 tablets (25-50 mg total) by mouth every 12 (twelve) hours as needed for moderate pain. 30 tablet 0   No current facility-administered medications for this visit.    Patient confirms/reports the following allergies:  Allergies  Allergen Reactions   Amoxicillin Other (See Comments)    No orders of the defined types were placed in this encounter.   AUTHORIZATION INFORMATION Primary Insurance: 1D#: Group #:  Secondary Insurance: 1D#: Group #:  SCHEDULE INFORMATION: Date: TBD Time: Location: [facility]

## 2023-04-27 NOTE — Telephone Encounter (Signed)
Patient recently seen by you on 03/29/23 who is pending cardiac risk assessment for colonoscopy. Would you please comment on cardiac risk for upcoming procedure and route your response to p cv div preop?  Thank you, Levi Aland, NP-C 04/27/2023, 2:50 PM [address], [address] Office [phone] Fax [phone]

## 2023-04-27 NOTE — Telephone Encounter (Signed)
  Chief Complaint: daughter on DPR requesting OV for assist with home care , rash , immobility , at ED now  Symptoms: raised rash to groin area and shoulder, blistered, itching. had difficulty breathing / swallowing this am ,at ED now .  Catheter in place due to enlarged prostate. Difficulty caring for self at home  Frequency: na Pertinent Negatives: Patient denies chest pain difficulty breathing now no fever Disposition: [] ED /[] Urgent Care (no appt availability in office) / [] Appointment(In office/virtual)/ []  [facility]/ [] Home Care/ [] Refused Recommended Disposition /[] [place] [facility]/ [x]  Follow-up with PCP Additional Notes:   Please advise if appt needed or My Chart VV appropriate. Patient currently in ED. Please advise.      Reason for Disposition  Large or small blisters on skin (i.e., fluid filled bubbles or sacs)  Answer Assessment - Initial Assessment Questions 1. APPEARANCE of RASH: "Describe the rash." (e.g., spots, blisters, raised areas, skin peeling, scaly)     Red raised blisters per daughter on DPR 2. SIZE: "How big are the spots?" (e.g., tip of pen, eraser, coin; inches, centimeters)     Wide spread groin legs , penis  3. LOCATION: "Where is the rash located?"     Groin legs penis shoulder 4. COLOR: "What color is the rash?" (Note: It is difficult to assess rash color in people with darker-colored skin. When this situation occurs, simply ask the caller to describe what they see.)     Na  5. ONSET: "When did the rash begin?"     Na  6. FEVER: "Do you have a fever?" If Yes, ask: "What is your temperature, how was it measured, and when did it start?"     Not sure  7. ITCHING: "Does the rash itch?" If Yes, ask: "How bad is the itch?" (Scale 1-10; or mild, moderate, severe)     Yes  8. CAUSE: "What do you think is causing the rash?"     Catheter in bladder due to issues with prostate 9. MEDICINE FACTORS: "Have you started any new medicines within the  last 2 weeks?" (e.g., antibiotics)      Na  10. OTHER SYMPTOMS: "Do you have any other symptoms?" (e.g., dizziness, headache, sore throat, joint pain)       Difficulty swallowing and breathing this am now in ED. 11. PREGNANCY: "Is there any chance you are pregnant?" "When was your last menstrual period?"       na  Protocols used: Rash or Redness - [facility]

## 2023-04-28 ENCOUNTER — Ambulatory Visit: Payer: Medicare HMO | Admitting: Physician Assistant

## 2023-04-28 NOTE — Telephone Encounter (Signed)
   Primary Cardiologist: Debbe Odea, MD  Chart reviewed as part of pre-operative protocol coverage. Given past medical history and time since last visit, based on ACC/AHA guidelines, JONNATHAN BIRMAN would be at acceptable risk for the planned procedure without further cardiovascular testing.   Patient was advised that if he develops new symptoms prior to surgery to contact our office to arrange a follow-up appointment.  He verbalized understanding.  Per office protocol, patient can hold Eliquis for 2 days prior to procedure. Resume as soon as hemodynamically stable post-procedure.  I will route this recommendation to the requesting party via Epic fax function and remove from pre-op pool.  Please call with questions.  Levi Aland, NP-C  04/28/2023, 8:26 AM [address], [address] Office [phone] Fax [phone]

## 2023-04-28 NOTE — Progress Notes (Deleted)
     I,Sha'taria Taishaun Levels,acting as a Neurosurgeon for [employer], PA-C.,have documented all relevant documentation on the behalf of Debera Lat, PA-C,as directed by  [employer], PA-C while in the presence of [employer], PA-C.   Established patient visit   Patient: Matthew Jordan   DOB: 04-Mar-1951   72 y.o. Male  MRN: [id number] Visit Date: 04/28/2023  Today's healthcare provider: Debera Lat, PA-C   No chief complaint on file.  Subjective    HPI  Follow up Hospitalization  Patient was admitted to *** on *** and discharged on ***. He was treated for ***. Treatment for this included ***. Telephone follow up was done on *** He reports {excellent/good/fair:19992} compliance with treatment. He reports this condition is {resolved/improved/worsened:23923}.  ----------------------------------------------------------------------------------------- -   Medications: Outpatient Medications Prior to Visit  Medication Sig   apixaban (ELIQUIS) 5 MG TABS tablet Take 1 tablet (5 mg total) by mouth 2 (two) times daily.   clotrimazole (CLOTRIMAZOLE ANTI-FUNGAL) 1 % cream Apply 1 Application topically 2 (two) times daily.   clotrimazole-betamethasone (LOTRISONE) cream Apply 1 Application topically 2 (two) times daily.   diltiazem (CARDIZEM CD) 240 MG 24 hr capsule Take 1 capsule (240 mg total) by mouth daily.   furosemide (LASIX) 40 MG tablet TAKE 1 TABLET BY MOUTH DAILY AS NEEDED   lisinopril-hydrochlorothiazide (ZESTORETIC) 10-12.5 MG tablet Take 1 tablet by mouth daily.   nystatin (MYCOSTATIN/NYSTOP) powder Apply 1 Application topically 3 (three) times daily.   ROCKLATAN 0.02-0.005 % SOLN Apply 1 drop to eye at bedtime.   tadalafil (CIALIS) 5 MG tablet Take 1-2 tablets (5-10 mg total) by mouth daily as needed for erectile dysfunction (take 1 hour prior to sexual activity).   tamsulosin (FLOMAX) 0.4 MG CAPS capsule Take 1 capsule (0.4 mg total) by mouth daily.   terbinafine (LAMISIL) 250  MG tablet Take 1 tablet (250 mg total) by mouth daily.   traMADol (ULTRAM) 50 MG tablet Take 0.5-1 tablets (25-50 mg total) by mouth every 12 (twelve) hours as needed for moderate pain.   No facility-administered medications prior to visit.    Review of Systems  {Labs  Heme  Chem  Endocrine  Serology  Results Review (optional):23779}   Objective    There were no vitals taken for this visit. {Show previous vital signs (optional):23777}  Physical Exam  ***  No results found for any visits on 04/28/23.  Assessment & Plan     ***  No follow-ups on file.      {provider attestation***:1}   Debera Lat, PA-C  [facility] [phone] (phone) [phone] (fax)  [facility]

## 2023-05-02 NOTE — Telephone Encounter (Signed)
I am okay with ordering HH Nurse, but he will need an in-person appointment since Medicare requires a face-to-face visit to order [facility].

## 2023-05-03 ENCOUNTER — Other Ambulatory Visit: Payer: Self-pay

## 2023-05-03 ENCOUNTER — Telehealth: Payer: Self-pay

## 2023-05-03 DIAGNOSIS — Z1211 Encounter for screening for malignant neoplasm of colon: Secondary | ICD-10-CM

## 2023-05-03 MED ORDER — NA SULFATE-K SULFATE-MG SULF 17.5-3.13-1.6 GM/177ML PO SOLN: 1.0000 | Freq: Once | ORAL | 0 refills | Status: AC

## 2023-05-03 NOTE — Telephone Encounter (Signed)
Cardiac clearance has been granted for patient to have his colonoscopy.  Per CV Preop Team chart note received " Okay for procedure from a cardiac perspective.  Hold Eliquis 48 hours prior to procedure, restart as soon as safely possible after".  Patient has been contacted with the assistance of [employer] [phone] Cyril Mourning to assist in scheduling patients colonoscopy.  Patients colonoscopy has been scheduled for 05/25/23 at [facility] with Dr. Midge Minium.  Patient advised to stop Eliquis on 05/23/23.  Restart after colonoscopy.  Instructions reviewed and sent to patient via mychart and mail.  Rx sent to pharmacy for Suprep.

## 2023-05-03 NOTE — Telephone Encounter (Addendum)
Spoke with pt per [employer] pt plan oor MD is out of network. Per pt he will  call [employer]/ [employer] to find out who is in network. Pt will let me know if he want to cancel.   Spoke with pt on 05-05-2023 pt has not call insurance company to see who he is in network with as of now pt wants to keep appt. With [place] on 05-25-2023  Per pt he has an emergency must leave for [place] and want to move appt until 8-122024. Pt also states he called [phone] ( insurance [employer])and he was told he only had to pay 25.00. PT want to proceeded with procedure here even though insurance states out of network.  Spoke with  customer svr rep at [phone]  both facility and MD out of network . Spoke with patient with interpreter pt states he still wants to have procedure and that he is sure the insurance rep told him it would only be 25.00 .

## 2023-05-06 NOTE — Telephone Encounter (Signed)
Misty Stanley spoke with patient again to let him know that 06/25/23 is on a Saturday and we could do 06/27/23.  Instructions have been updated referral updated to reflect date change as well as physician change.  Thanks,  [place], [place]

## 2023-05-23 ENCOUNTER — Encounter: Payer: Self-pay | Admitting: Physician Assistant

## 2023-06-05 IMAGING — CR DG CHEST 2V
1 series · 2 of 2 positions shown · non-contrast
Comparison: 11/16/2020

CLINICAL DATA: Shortness of breath

EXAM:
CHEST - 2 VIEW

[Series 1: dg chest 2 view · 0.14mm/px · 2 of 2 slices shown]
[im 1/2]
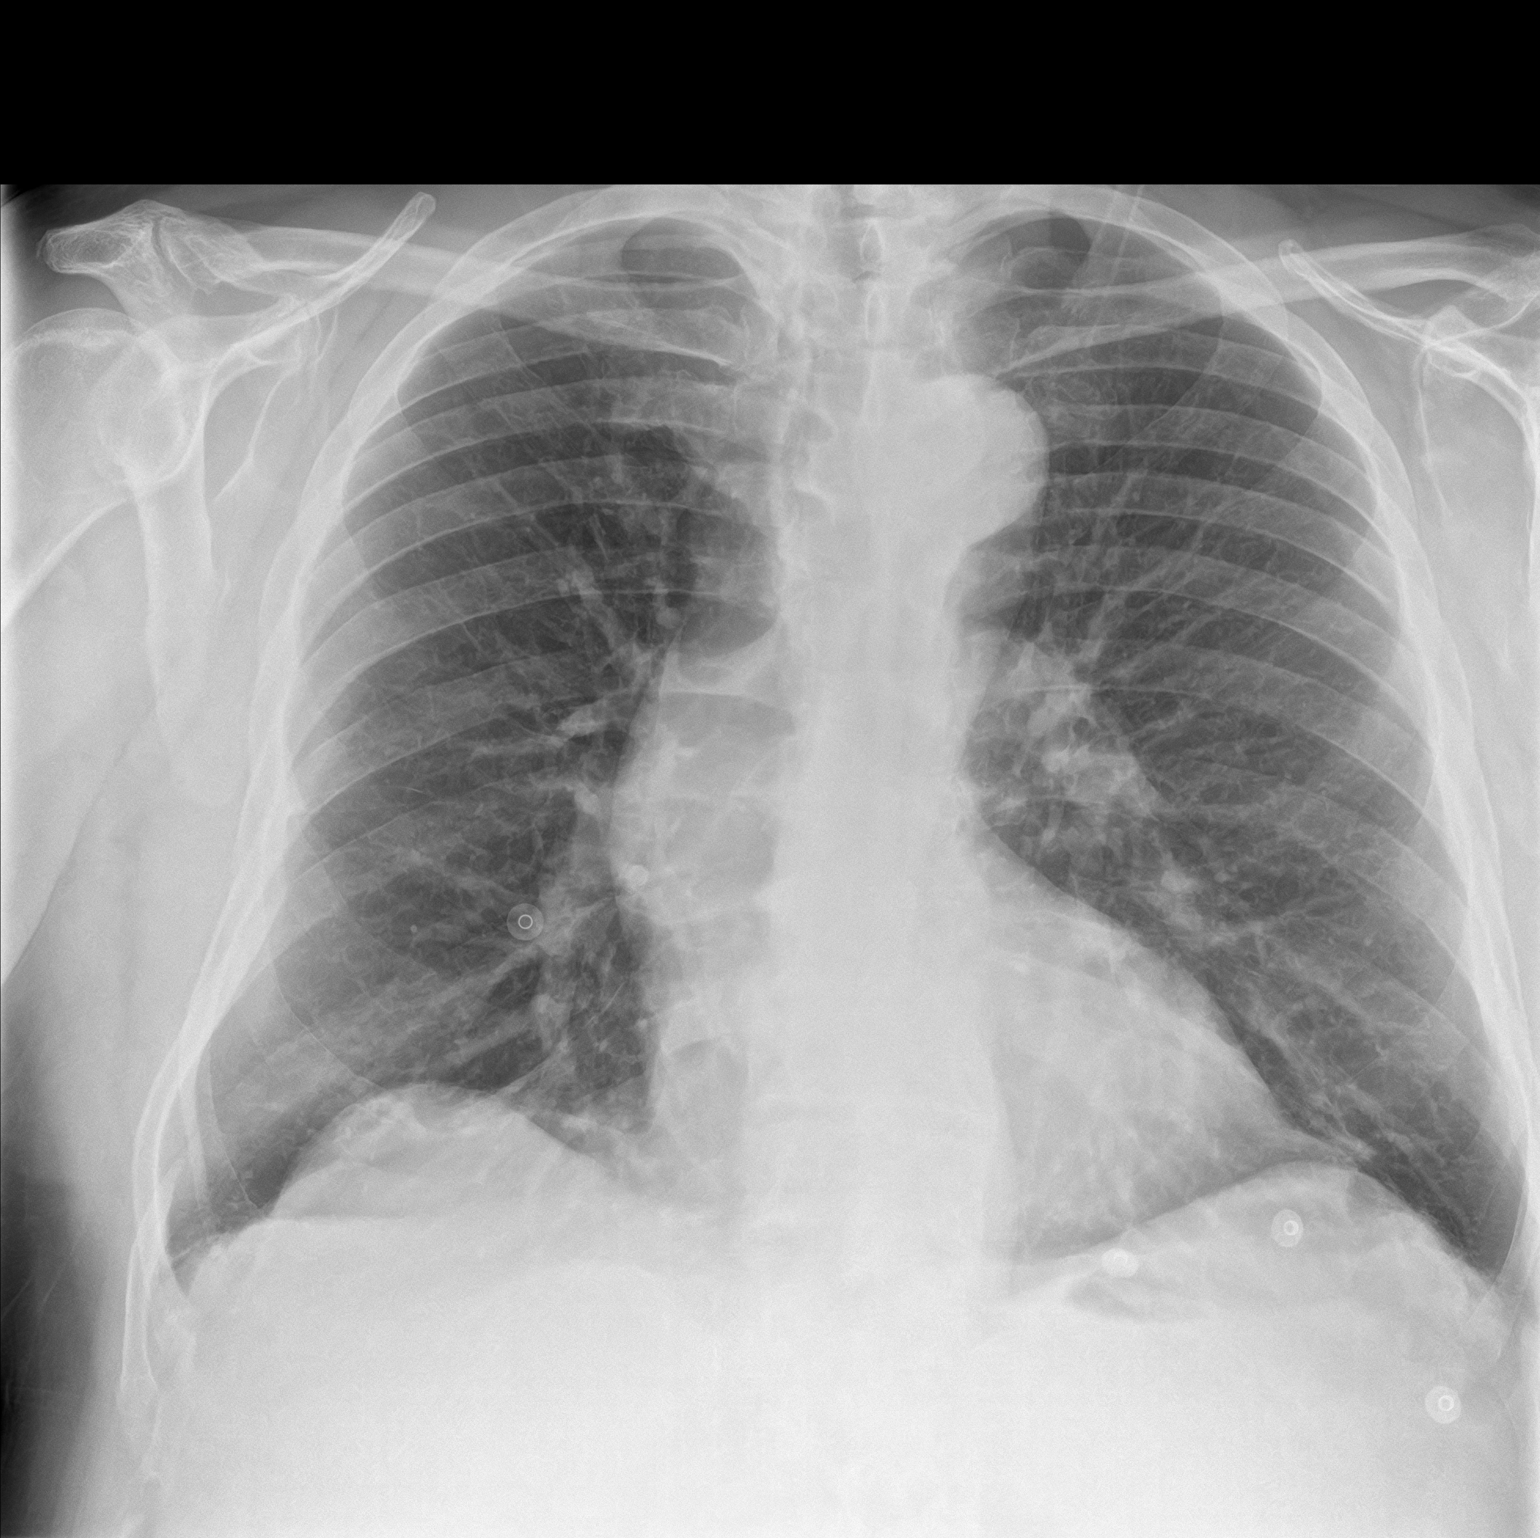
[im 2/2]
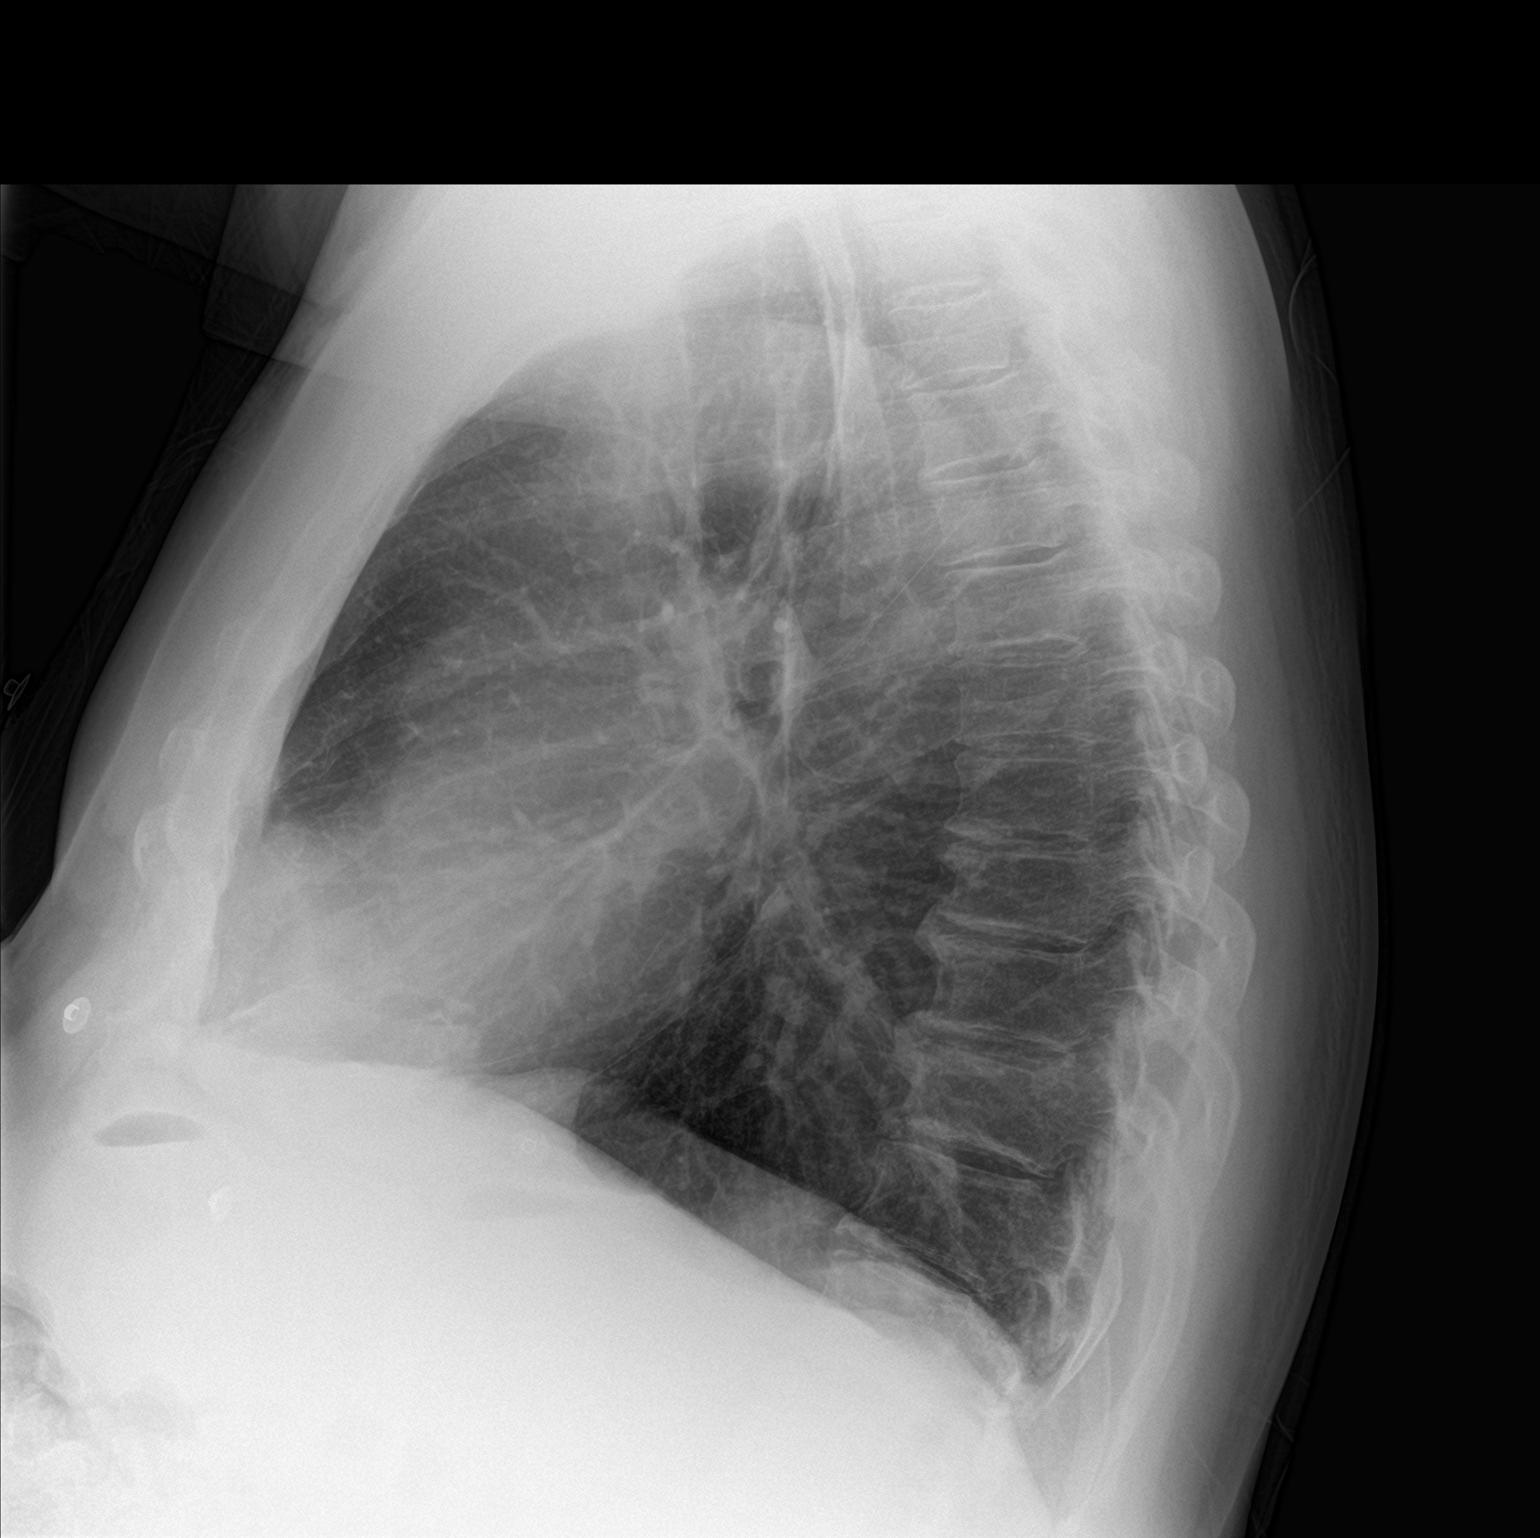

[2 of 2 positions shown; findings below may reference images not displayed]

FINDINGS: Check shadow is within normal limits. Lungs are well aerated
bilaterally. No focal infiltrate or sizable effusion is seen. No
sizable effusion is noted. No acute bony abnormality is seen.
IMPRESSION: No active cardiopulmonary disease.

## 2023-06-20 ENCOUNTER — Encounter: Payer: Self-pay | Admitting: Gastroenterology

## 2023-06-27 ENCOUNTER — Ambulatory Visit: Admission: RE | Admit: 2023-06-27 | Payer: Medicare HMO | Source: Home / Self Care | Admitting: Gastroenterology

## 2023-06-27 ENCOUNTER — Encounter: Admission: RE | Payer: Self-pay | Source: Home / Self Care

## 2023-06-27 SURGERY — COLONOSCOPY WITH PROPOFOL
Anesthesia: General

## 2023-07-20 ENCOUNTER — Ambulatory Visit: Payer: Medicare HMO | Admitting: Urology

## 2023-08-14 ENCOUNTER — Other Ambulatory Visit: Payer: Self-pay | Admitting: Physician Assistant

## 2023-08-14 DIAGNOSIS — N138 Other obstructive and reflux uropathy: Secondary | ICD-10-CM

## 2023-08-31 ENCOUNTER — Ambulatory Visit: Payer: Medicare HMO | Admitting: Urology

## 2023-08-31 ENCOUNTER — Other Ambulatory Visit: Payer: Self-pay

## 2023-08-31 ENCOUNTER — Encounter: Payer: Self-pay | Admitting: Urology

## 2023-08-31 ENCOUNTER — Other Ambulatory Visit
Admission: RE | Admit: 2023-08-31 | Discharge: 2023-08-31 | Disposition: A | Discharge status: Home / Self Care | Payer: Medicare HMO | Attending: Urology | Admitting: Urology

## 2023-08-31 VITALS — BP 138/84 | HR 63 | Ht 70.0 in | Wt 228.0 lb

## 2023-08-31 DIAGNOSIS — N401 Enlarged prostate with lower urinary tract symptoms: Secondary | ICD-10-CM | POA: Diagnosis not present

## 2023-08-31 DIAGNOSIS — N138 Other obstructive and reflux uropathy: Secondary | ICD-10-CM | POA: Diagnosis present

## 2023-08-31 DIAGNOSIS — R339 Retention of urine, unspecified: Secondary | ICD-10-CM

## 2023-08-31 MED ORDER — NITROFURANTOIN MONOHYD MACRO 100 MG PO CAPS: 100.0000 mg | ORAL_CAPSULE | Freq: Two times a day (BID) | ORAL | 0 refills | Status: DC

## 2023-08-31 NOTE — Progress Notes (Signed)
08/31/2023 8:41 AM   [name] 1951-02-04 [id number]  Reason for visit: Follow up BPH and recurrent urinary retention  HPI: 72 year old male who I originally met in March 2024 for long history of BPH, recurrent retention and mildly elevated PSA that has been chronically elevated ranging from 7-8 over the last 10 years with a negative prostate biopsy previously in [place] that reportedly showed a significantly enlarged prostate.  He is on maximal medical therapy with Flomax and finasteride, he has developed retention requiring Foley catheter at least 6 times, including a suprapubic tube at some point in [place].  In June 2024 he underwent a cystoscopy showing massive prostate with high bladder neck and large median lobe, moderate to severe trabeculations, no suspicious lesions, TRUS showed a prostate volume of 213 g.  Foley catheter was replaced at that visit.  I recommended HOLEP at that time, but he never followed up.  At some point his Foley catheter was removed, and he developed recurrent retention when he was in [place] last month.  He currently has a 14 [place] Foley in place.  He thinks he might have a UTI because he is having some burning at the penis and in his bladder.  His Foley catheter was replaced today with an 72 French coud Foley, please see procedure note.  I also sent in 2 weeks of Macrobid based on high likelihood of UTI, will follow-up culture from new catheter.  We again discussed the importance of a definitive outlet procedure like HOLEP with his numerous episodes of recurrent retention and infections.  He is in agreement. We discussed the risks and benefits of HoLEP at length.  The procedure requires general anesthesia and takes 1 to 2 hours, and a holmium laser is used to enucleate the prostate and push this tissue into the bladder.  A morcellator is then used to remove this tissue, which is sent for pathology.  The vast majority(>95%) of patients are able to  discharge the same day with a catheter in place for 2 to 3 days, and will follow-up in clinic for a voiding trial.  We specifically discussed the risks of bleeding, infection, retrograde ejaculation, temporary urgency and urge incontinence, very low risk of long-term incontinence, urethral stricture/bladder neck contracture, pathologic evaluation of prostate tissue and possible detection of prostate cancer or other malignancy, and possible need for additional procedures.  Schedule HOLEP  Sondra Come, MD  [facility] [address] [place], [place] [address] [phone]

## 2023-08-31 NOTE — Progress Notes (Unsigned)
Surgical Physician Order Form [facility]  Dr. [name], MD  * Scheduling expectation : Next Available  *Length of Case: 2.5 hours  *Clearance needed: no  *Anticoagulation Instructions: Hold all anticoagulants  *Aspirin Instructions: Hold Aspirin and Plavix  *Post-op visit Date/Instructions:  1-3 day cath removal  *Diagnosis: BPH w/BOO  *Procedure:  HOLEP ([address])   Additional orders: N/A  -Admit type: OUTpatient  -Anesthesia: General  -VTE Prophylaxis Standing Order SCD's       Other:   -Standing Lab Orders Per Anesthesia    Lab other: UA&Urine Culture  -Standing Test orders EKG/Chest x-ray per Anesthesia       Test other:   - Medications:  Cipro 400mg  IV  -Other orders:  N/A

## 2023-08-31 NOTE — Patient Instructions (Signed)
Enucleacin de la prstata con lser de Holmio (HoLEP)  Holmium Laser Enucleation of the Prostate (HoLEP)  La HoLEP es un tratamiento para los hombres con hiperplasia prosttica benigna (BPH, por sus siglas en ingls). La intervencin con lser elimina las obstrucciones del flujo de la [place] y se hace sin ninguna incisin en el cuerpo.  Qu es la HoLEP?  La HoLEP es un tipo de intervencin con lser que se [place] para tratar la obstruccin (bloqueo) del flujo de la orina como resultado de la hiperplasia prosttica benigna (BPH). En los hombres con BPH, la glndula prosttica no es cancerosa, [place] se ha aumentado de [place]. El aumento de tamao de la prstata puede provocar una serie de sntomas en las vas urinarias, tales como un chorro de orina escaso, dificultad para Corporate investment banker a Geographical information systems officer, incapacidad para Geographical information systems officer, Geographical information systems officer con frecuencia o levantarse por la noche para Geographical information systems officer.  La HoLEP se desarroll en la dcada de los 1990's como una opcin quirrgica ms eficaz y menos costosa para la BPH, en comparacin con otras opciones quirrgicas tales como la vaporizacin (PVP/lser de luz verde), la extirpacin transuretral de la prstata (TURP) y la prostatectoma abierta simple.  Qu ocurre durante una intervencin de HoLEP?   La HoLEP requiere anestesia general (el paciente est "dormido" durante el procedimiento).   Se le da un antibitico para reducir el riesgo de infeccin.   Se introduce un instrumento quirrgico llamado resectoscopio a travs de la uretra (el conducto que lleva la orina desde la vejiga). El resectoscopio tiene una cmara que le permite al cirujano ver la estructura interna de la glndula prosttica y ver por dnde se hacen las incisiones durante la [place].   El lser se inserta en el resectoscopio y se [place] para Pension scheme manager (separar) el tejido prosttico agrandado de la cpsula (cubierta exterior) y luego sellar los vasos sanguneos. El tejido extirpado se introduce en la  vejiga.   Se coloca un morcelador a travs del resectoscopio y se [place] para succionar el tejido prosttico que se ha introducido en la vejiga.   Una vez extrado el tejido de la prstata, se retira el resectoscopio y se coloca una sonda de [place] para facilitar la sanacin y drenar la orina de la vejiga.  Qu ocurre despus de una intervencin de HoLEP?  Ms del 90% de los pacientes se Zenaida Niece a casa el mismo da, unas horas despus de la operacin. Menos del 10% ingresarn al hospital por una noche, para observacin y [address] de la [place], o si tienen otros problemas mdicos.   Se har un lavado del catter con suero fisiolgico aproximadamente por una hora despus de la operacin para eliminar la sangre de la [place]. Es normal que haya algo de sangre en la orina despus de la intervencin. La necesidad de una transfusin de sangre es extremadamente rara.   Una vez que el paciente se haya despertado completamente de la anestesia, se le permite comer y tomar lquidos.   El catter [employer] se retira despus de 2-3 das de la operacin; el paciente ir a la clnica para que le retiren Associate Professor y se asegure que puede orinar por s mismo.   Es muy importante tomar muchos lquidos despus de la intervencin por una semana para mantener la vejiga limpia.   Al principio puede haber algo de ardor al Geographical information systems officer, pero esto generalmente mejora en unas horas o das. La mayora de los pacientes no presentan un dolor intenso, y rara vez se necesitan analgsicos narcticos.  Los sntomas de Geographical information systems officer con frecuencia, con urgencia e incluso de un goteo son NORMALES durante las primeras semanas despus de la cir[place], ya que la vejiga se adapta despus de haber tenido que trabajar contra el bloqueo de la prstata durante muchos aos. Esto mejorar, pero a veces puede tomar varios meses.   El uso de ejercicios del suelo plvico (ejercicios de Kegel) pueden ayudar a mejorar los problemas de incontinencia  urinaria.   Despus de [employer], los pacientes sern examinados a las 6 semanas y a los 6 meses para revisar los sntomas.   No se deben levantar objetos pesados por lo menos durante 2-3 semanas despus de la intervencin, sin embargo los pacientes pueden caminar y Engineer, manufacturing despus de la intervencin. El regreso al trabajo depende de la ocupacin que tenga.  Cules son las ventajas de la intervencin de HoLEP?   La HoLEP ha sido estudiado en muchas partes diferentes del mundo y ha demostrado ser un procedimiento seguro y Engineer, manufacturing. Aunque hay muchos tipos de operaciones de BPH disponibles, la HoLEP ofrece una ventaja nica al poder extraer Neomia Dear gran cantidad de tejido sin ninguna incisin en el cuerpo, incluso en prstatas muy grandes, al [employer] el riesgo de hemorragia y proporciona tejido para Electronics engineer (para Risk analyst). Esto disminuye la necesidad de transfusiones de sangre durante la intervencin, minimiza la estada en el hospital y reduce el riesgo de tener que repetir el tratamiento.  Cules son los efectos secundarios de la intervencin de HoLEP?   Ardor y sangrado temporal al [employer]. Puede verse algo de Retail buyer en la orina durante algunas semanas despus de la intervencin y es parte del proceso de sanacin.   Es de Youth worker que todos los pacientes presenten incontinencia urinaria (incapacidad para controlar el flujo de la orina) inmediatamente despus de la intervencin y debern usar protectores durante los primeros das/semanas. Esto generalmente mejora en el transcurso de varias semanas. La prctica de los ejercicios Kegel puede ayudar a disminuir el goteo por movimientos de esfuerzo como toser, estornudar o Astronomer. El porcentaje de pacientes con goteo a largo plazo es muy bajo. Los pacientes tambin pueden presentar goteos con [place] y esto puede tratarse con medicamentos. El riesgo de incontinencia con urgencia  puede depender de varios factores como la edad, el tamao de la prstata, los sntomas y otros problemas mdicos.   Eyaculacin retrgrada o "eyaculacin hacia atrs." En el 75% de los casos, el paciente no ver ningn lquido durante la eyaculacin despus de la intervencin.   La funcin erctil no suele verse afectada de forma considerable.  Cules son los riesgos de la intervencin de HoLEP?   Lesin de la uretra o desarrollo de tejido de Editor, commissioning en el futuro   Lesin de la cpsula de la prstata (normalmente tratada con un cateterismo ms largo).   Lesin de la vejiga o de los orificios ureterales (por donde Comptroller orina del rin)   Infeccin de la vejiga, los testculos o los riones   Reaparicin de la obstruccin urinaria posteriormente que requiere otra intervencin (<2%)   Necesidad de transfusin de [place] u otra intervencin debido a una hemorragia   Falta de alivio de todos los sntomas y/o necesidad de cateterismo prolongado despus de la  ciruga   En un 5-15% de los pacientes se Clinical research associate un cncer de prstata no diagnosticado  previamente en la [place]. El cncer de prstata puede tratarse despus de la HoLEP.   Riesgos  habituales de la anestesia, como cogulos de [place], [place], Catering manager.  Cundo debo llamar a mi mdico?   Si presenta fiebre de ms de 101.3 grados   Incapacidad para orinar, o grandes cogulos de sangre en la orina  Holmium Laser Enucleation of the Prostate (HoLEP)  HoLEP is a treatment for men with benign prostatic hyperplasia (BPH). The laser surgery removed blockages of urine flow, and is done without any incisions on the body.     What is HoLEP?  HoLEP is a type of laser surgery used to treat obstruction (blockage) of urine flow as a result of benign prostatic hyperplasia (BPH). In men with BPH, the prostate gland is not cancerous, but has become enlarged. An enlarged prostate can result in a number of urinary tract  symptoms such as weak urinary stream, difficulty in starting urination, inability to urinate, frequent urination, or getting up at night to urinate.  HoLEP was developed in the 1990's as a more effective and less expensive surgical option for BPH, compared to other surgical options such as laser vaporization(PVP/greenlight laser), transurethral resection of the prostate(TURP), and open simple prostatectomy.   What happens during a HoLEP?  HoLEP requires general anesthesia ("asleep" throughout the procedure).   An antibiotic is given to reduce the risk of infection  A surgical instrument called a resectoscope is inserted through the urethra (the tube that carries urine from the bladder). The resectoscope has a camera that allows the surgeon to view the internal structure of the prostate gland, and to see where the incisions are being made during surgery.  The laser is inserted into the resectoscope and is used to enucleate (free up) the enlarged prostate tissue from the capsule (outer shell) and then to seal up any blood vessels. The tissue that has been removed is pushed back into the bladder.  A morcellator is placed through the resectoscope, and is used to suction out the prostate tissue that has been pushed into the bladder.  When the prostate tissue has been removed, the resectoscope is removed, and a foley catheter is placed to allow healing and drain the urine from the bladder.     What happens after a HoLEP?  More than 95% of patients go home the same day a few hours after surgery. Less than 5% will be admitted to the hospital overnight for observation to monitor the urine, or if they have other medical problems.  Fluid is flushed through the catheter for about 1 hour after surgery to clear any blood from the urine. It is normal to have some blood in the urine after surgery. The need for blood transfusion is extremely rare.  Eating and drinking are permitted after the procedure once  the patient has fully awakened from anesthesia.  The catheter is usually removed 2-3 days after surgery- the patient will come to clinic to have the catheter removed and make sure they can urinate on their own.  It is very important to drink lots of fluids after surgery for one week to keep the bladder flushed.  At first, there may be some burning with urination, but this typically improved within a few hours to days. Most patients do not have a significant amount of pain, and narcotic pain medications are rarely needed.  Symptoms of urinary frequency, urgency, and even leakage are NORMAL for the first few weeks after surgery as the bladder adjusts after having to work hard against blockage from the prostate for many years. This will improve, but can sometimes  take several months.  The use of pelvic floor exercises (Kegel exercises) can help improve problems with urinary incontinence.   After catheter removal, patients will be seen at 12 weeks and 6 months for symptom check  No heavy lifting for at least 2-3 weeks after surgery, however patients can walk and do light activities the first day after surgery. Return to work time depends on occupation.    What are the advantages of HoLEP?  HoLEP has been studied in many different parts of the world and has been shown to be a safe and effective procedure. Although there are many types of BPH surgeries available, HoLEP offers a unique advantage in being able to remove a large amount of tissue without any incisions on the body, even in very large prostates, while decreasing the risk of bleeding and providing tissue for pathology (to look for cancer). This decreases the need for blood transfusions during surgery, minimizes hospital stay, and reduces the risk of needing repeat treatment.  What are the side effects of HoLEP?  Temporary burning and bleeding during urination. Some blood may be seen in the urine for weeks after surgery and is part of the  healing process.  Urinary incontinence (inability to control urine flow) is expected in all patients immediately after surgery and they should wear pads for the first few days/weeks. This typically improves over the course of several weeks. Performing Kegel exercises can help decrease leakage from stress maneuvers such as coughing, sneezing, or lifting. The rate of long term leakage is very low, 1-2%. Patients may also have leakage with urgency and this may be treated with medication. The risk of urge incontinence can be dependent on several factors including age, prostate size, symptoms, and other medical problems.  Retrograde ejaculation or "backwards ejaculation." In 80% of cases, the patient will not see any fluid during ejaculation after surgery.  Erectile function is generally not significantly affected.   What are the risks of HoLEP?  Injury to the urethra or development of scar tissue at a later date  Injury to the capsule of the prostate (typically treated with longer catheterization).  Injury to the bladder or ureteral orifices (where the urine from the kidney drains out)  Infection of the bladder, testes, or kidneys (~4%)  Return of urinary obstruction at a later date requiring another operation (<2%)  Need for blood transfusion or re-operation due to bleeding  Failure to relieve all symptoms and/or need for prolonged catheterization after surgery  5-15% of patients are found to have previously undiagnosed prostate cancer in their specimen. Prostate cancer can be treated after HoLEP.  Standard risks of anesthesia including blood clots, heart attacks, etc ~1-2% risk of long term urinary incontinence (leakage)  When should I call my doctor?  Fever over 101.3 degrees  Inability to urinate, or large blood clots in the urine

## 2023-09-01 ENCOUNTER — Telehealth: Payer: Self-pay

## 2023-09-01 NOTE — Progress Notes (Signed)
   [facility] Posting Form  Surgery Date: Date: 09/09/2023  Surgeon: Dr. 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

## 2023-09-01 NOTE — Telephone Encounter (Signed)
Per Dr. Richardo Hanks, Patient is to be scheduled for Holmium Laser Enucleation of the Prostate   Matthew Jordan was contacted and possible surgical dates were discussed, Friday October 25th, 2024 was agreed upon for surgery.   Patient was directed to call [phone] between 1-3pm the day before surgery to find out surgical arrival time.  Instructions were given not to eat or drink from midnight on the night before surgery and have a driver for the day of surgery. On the surgery day patient was instructed to enter through the [facility] entrance of [facility] report the Same Day Surgery desk.   Pre-Admit Testing will be in contact via phone to set up an interview with the anesthesia team to review your history and medications prior to surgery.   Reminder of this information was sent via MyChart and Mailed to the patient.

## 2023-09-02 LAB — URINE CULTURE: Culture: >=100000 — AB

## 2023-09-05 ENCOUNTER — Other Ambulatory Visit: Payer: Self-pay

## 2023-09-05 ENCOUNTER — Emergency Department
Admission: EM | Admit: 2023-09-05 | Discharge: 2023-09-05 | Discharge status: Against Medical Advice | Payer: Medicare HMO | Attending: Emergency Medicine | Admitting: Emergency Medicine

## 2023-09-05 DIAGNOSIS — Y838 Other surgical procedures as the cause of abnormal reaction of the patient, or of later complication, without mention of misadventure at the time of the procedure: Secondary | ICD-10-CM | POA: Diagnosis not present

## 2023-09-05 DIAGNOSIS — T83031A Leakage of indwelling urethral catheter, initial encounter: Secondary | ICD-10-CM | POA: Insufficient documentation

## 2023-09-05 DIAGNOSIS — Z5321 Procedure and treatment not carried out due to patient leaving prior to being seen by health care provider: Secondary | ICD-10-CM | POA: Insufficient documentation

## 2023-09-05 NOTE — ED Triage Notes (Signed)
Pt comes to ER for foley catheter leak.

## 2023-09-05 NOTE — ED Provider Triage Note (Signed)
Emergency Medicine Provider Triage Evaluation Note  Matthew Jordan , a 72 y.o. male  was evaluated in triage.  Pt complains of catheter leaking.  Review of Systems  Positive:  Negative:   Physical Exam  There were no vitals taken for this visit. Gen:   Awake, no distress   Resp:  Normal effort  MSK:   Moves extremities without difficulty  Other:    Medical Decision Making  Medically screening exam initiated at 1:22 PM.  Appropriate orders placed.  THACKERY PORTUGAL was informed that the remainder of the evaluation will be completed by another provider, this initial triage assessment does not replace that evaluation, and the importance of remaining in the ED until their evaluation is complete.     Faythe Ghee, PA-C 09/05/23 1323

## 2023-09-06 ENCOUNTER — Other Ambulatory Visit: Payer: Self-pay

## 2023-09-06 ENCOUNTER — Telehealth: Payer: Self-pay | Admitting: *Deleted

## 2023-09-06 ENCOUNTER — Encounter
Admission: RE | Admit: 2023-09-06 | Discharge: 2023-09-06 | Disposition: A | Discharge status: Home / Self Care | Payer: Medicare HMO | Source: Ambulatory Visit | Attending: Urology | Admitting: Urology

## 2023-09-06 ENCOUNTER — Encounter: Payer: Self-pay | Admitting: Urology

## 2023-09-06 VITALS — Ht 69.0 in | Wt 228.0 lb

## 2023-09-06 DIAGNOSIS — I2510 Atherosclerotic heart disease of native coronary artery without angina pectoris: Secondary | ICD-10-CM

## 2023-09-06 DIAGNOSIS — I10 Essential (primary) hypertension: Secondary | ICD-10-CM

## 2023-09-06 DIAGNOSIS — E669 Obesity, unspecified: Secondary | ICD-10-CM

## 2023-09-06 DIAGNOSIS — R7303 Prediabetes: Secondary | ICD-10-CM

## 2023-09-06 NOTE — Patient Instructions (Addendum)
Your procedure is scheduled on: Friday 09/09/2023  Report to the Registration Desk on the 1st floor of the [facility]. To find out your arrival time, please call [phone] between 1PM - 3PM on: Thursday 09/08/23 If your arrival time is 6:00 am, do not arrive before that time as the [facility] entrance doors do not open until 6:00 am.  REMEMBER: Instructions that are not followed completely may result in serious medical risk, up to and including death; or upon the discretion of your surgeon and anesthesiologist your surgery may need to be rescheduled.  Do not eat food or drink fluids after midnight the night before surgery.  No gum chewing or hard candies.   One week prior to surgery: Stop Anti-inflammatories (NSAIDS) such as Advil, Aleve, Ibuprofen, Motrin, Naproxen, Naprosyn and Aspirin based products such as Excedrin, Goody's Powder, BC Powder. Stop ANY OVER THE COUNTER supplements until after surgery.  You may however, continue to take Tylenol if needed for pain up until the day of surgery.  Continue taking all of your other prescription medications up until the day of surgery.  ON THE DAY OF SURGERY ONLY TAKE THESE MEDICATIONS WITH SIPS OF WATER:  nitrofurantoin, macrocrystal-monohydrate, (MACROBID) 100 MG  tamsulosin (FLOMAX) 0.4 MG   No Alcohol for 24 hours before or after surgery.  No Smoking including e-cigarettes for 24 hours before surgery.  No chewable tobacco products for at least 6 hours before surgery.  No nicotine patches on the day of surgery.  Do not use any "recreational" drugs for at least a week (preferably 2 weeks) before your surgery.  Please be advised that the combination of cocaine and anesthesia may have negative outcomes, up to and including death. If you test positive for cocaine, your surgery will be cancelled.  On the morning of surgery brush your teeth with toothpaste and water, you may rinse your mouth with mouthwash if you wish. Do not  swallow any toothpaste or mouthwash.  Get a fresh shower in the morning before coming to the hospital.  Do not wear jewelry, make-up, hairpins, clips or nail polish.  For welded (permanent) jewelry: bracelets, anklets, waist bands, etc.  Please have this removed prior to surgery.  If it is not removed, there is a chance that hospital personnel will need to cut it off on the day of surgery.  Do not wear lotions, powders, or perfumes.   Do not shave body hair from the neck down 48 hours before surgery.  Contact lenses, hearing aids and dentures may not be worn into surgery.  Do not bring valuables to the hospital. [facility] is not responsible for any missing/lost belongings or valuables.   Notify your doctor if there is any change in your medical condition (cold, fever, infection).  Wear comfortable clothing (specific to your surgery type) to the hospital.  After surgery, you can help prevent lung complications by doing breathing exercises.  Take deep breaths and cough every 1-2 hours. Your doctor may order a device called an Incentive Spirometer to help you take deep breaths. When coughing or sneezing, hold a pillow firmly against your incision with both hands. This is called "splinting." Doing this helps protect your incision. It also decreases belly discomfort.  If you are being admitted to the hospital overnight, leave your suitcase in the car. After surgery it may be brought to your room.  In case of increased patient census, it may be necessary for you, the patient, to continue your postoperative care in  the Same Day Surgery department.  If you are being discharged the day of surgery, you will not be allowed to drive home. You will need a responsible individual to drive you home and stay with you for 24 hours after surgery.   If you are taking public transportation, you will need to have a responsible individual with you.  Please call the Pre-admissions Testing Dept. at [phone] if you have any questions about these instructions.  Surgery Visitation Policy:  Patients having surgery or a procedure may have two visitors.  Children under the age of 43 must have an adult with them who is not the patient.  Inpatient Visitation:    Visiting hours are 7 a.m. to 8 p.m. Up to four visitors are allowed at one time in a patient room. The visitors may rotate out with other people during the day.  One visitor age 63 or older may stay with the patient overnight and must be in the room by 8 p.m.     Su procedimiento est programado para: Viernes 09/09/2023. Presntese en el mostrador de Tax adviser del [employer] Para saber su hora de llegada, llame al [phone] entre la 1:00 p. m. y las 3:00 p. m. en: Peggye Ley 09/08/23 Si su hora de llegada es a las 6:00 am, no llegue antes de esa hora ya que las puertas de [place] del [facility] no se abren Teacher, adult education las 6:00 am.  RECORDAR: Las instrucciones que no se siguen completamente pueden provocar riesgos mdicos graves, que pueden llegar hasta la [place]; o, segn el criterio de su cirujano y Scientific laboratory technician, es posible que sea Aeronautical engineer su Leisure centre manager.  No ingiera alimentos ni bebidas despus de la medianoche del da anterior a la ciruga. No mascar chicle ni caramelos duros.   Una semana antes de la ciruga: Detenga los antiinflamatorios (AINE) como Advil, Aleve, Ibuprofeno, Motrin, Naproxen, Naprosyn y productos a base de aspirina como Excedrin, Goody's Powder, BC Powder. Suspenda CUALQUIER suplemento de venta libre hasta despus de la [place]. Sin embargo, puede Educational psychologist tomando Tylenol si es necesario para Marketing executive de la [place]. Contine tomando todos los medicamentos recetados, excepto los siguientes:   Siga las recomendaciones del cardilogo o PCP con respecto a suspender los anticoagulantes.  TOME SLO ESTOS MEDICAMENTOS LA MAANA DE LA CIRUGA CON UN SORBO DE  AGUA:  nitrofurantoin, macrocrystal-monohydrate, (MACROBID) 100 MG  tamsulosin (FLOMAX) 0.4 MG    No consumir alcohol durante 24 horas antes o despus de la [place].  No fumar, incluidos los cigarrillos electrnicos, durante las 24 horas previas a la [place]. No consumir productos de tabaco masticables durante al menos 6 horas antes de la [place]. Sin parches de Optometrist de la [place].  No use ningn medicamento "recreativo" durante al menos una semana (preferiblemente 2 semanas) antes de la ciruga. Tenga en cuenta que la combinacin de cocana y anestesia puede [employer], que pueden llegar hasta la [place]. Si su prueba de cocana da positivo, su ciruga ser cancelada.  La maana de la ciruga cepille sus dientes con pasta dental y agua, puede enjuagarse la boca con enjuague bucal si lo desea. No ingiera pasta de dientes ni enjuague bucal.  Tomese una bao la maana antes de venir al hospital.  No use joyas, maquillaje, horquillas, clips ni esmalte de uas.  No use lociones, polvos ni perfumes.  No se afeite el vello corporal desde el cuello hacia abajo 48 horas antes  de la [place].  No se pueden usar lentes de contacto, audfonos ni dentaduras postizas durante la [place].  No lleve objetos de valor al hospital. [facility] no es responsable de ninguna pertenencia u objeto de valor perdido o perdido.  Notifique a su mdico si hay algn cambio en su condicin mdica (resfriado, fiebre, infeccin).  Lleve ropa cmoda (especfica para su tipo de [place]) al hospital.  Despus de la ciruga, usted puede ayudar a prevenir complicaciones pulmonares haciendo ejercicios de respiracin. Respire profundamente y tosa cada 1 o 2 horas. Su mdico puede indicarle un dispositivo llamado espirmetro incentivador para ayudarle a respirar profundamente. Al toser o Engineering geologist, sostenga firmemente una almohada contra la incisin con ambas manos. Esto se llama  "ferulizacin". Hacer esto ayuda a proteger su incisin. Tambin disminuye las molestias abdominales.  Si vas a pasar la noche en el hospital, deja tu maleta en el coche. Despus de la [place], es posible que lo lleven a su habitacin.  En caso de un mayor censo de [place], puede ser necesario que usted, el [place], contine con su atencin posoperatoria en el departamento de [place] el [place].  Si le dan el alta el da de la [place], no se le permitir conducir a casa. Necesitar que una persona responsable lo lleve a su casa y se quede con usted durante las 24 horas posteriores a la [place].  Si viaja en transporte pblico, deber ir acompaado de una persona responsable.  Llame al Departamento de pruebas previas a la admisin al [phone] si tiene alguna pregunta sobre estas instrucciones.  Poltica de visitas a ciruga:  [employer] se someten a [place] o procedimiento pueden [employer] familiares o personas de apoyo con ellos, siempre y cuando la persona no sea positiva para COVID-19 ni experimente sus sntomas.  Visitas para pacientes hospitalizados:  El horario de visita es de 7 a 20 horas. Se permiten hasta cuatro visitantes a la vez en la habitacin de un paciente. Los visitantes podrn rotar con Garment/textile technologist. Neomia Dear persona de apoyo designada (adulto) podr pasar la noche.  Debido a un aumento en las tasas de VSR e influenza y las hospitalizaciones asociadas, los nios menores de 12 aos no podrn visitar a los pacientes en los hospitales de [employer]. Se siguen recomendando encarecidamente las mascarillas.

## 2023-09-06 NOTE — Telephone Encounter (Signed)
-----   Message from Verlee Monte sent at 09/06/2023 12:43 PM EDT ----- Regarding: Request for pre-operative cardiac clearance Request for pre-operative cardiac clearance:  1. What type of surgery is being performed?  HOLEP-LASER ENUCLEATION OF THE PROSTATE WITH MORCELLATION  2. When is this surgery scheduled?  09/09/2023  3. Type of clearance being requested (medical, pharmacy, both)? BOTH   4. Are there any medications that need to be held prior to surgery? APIXABAN  5. Practice name and name of physician performing surgery?  Performing surgeon: Dr. [name], MD Requesting clearance: Quentin Mulling, FNP-C    6. Anesthesia type (none, local, MAC, general)? GENERAL  7. What is the office phone and fax number?   Phone: [phone] Fax: [phone]  ATTENTION: Unable to create telephone message as per your standard workflow. Directed by [facility] providers to send requests for cardiac clearance to this pool for appropriate distribution to provider covering pre-operative clearances.   Quentin Mulling, MSN, APRN, FNP-C, CEN [facility]  Peri-operative Services Nurse Practitioner Phone: [phone] 09/06/23 12:43 PM

## 2023-09-06 NOTE — Telephone Encounter (Signed)
Patient with diagnosis of afib on Eliquis for anticoagulation.    Procedure: HOLEP-LASER ENUCLEATION OF THE PROSTATE WITH MORCELLATION   Date of procedure: 09/09/23  CHA2DS2-VASc Score = 4  This indicates a 4.8% annual risk of stroke. The patient's score is based upon: CHF History: 1 HTN History: 1 Diabetes History: 0 Stroke History: 0 Vascular Disease History: 1 Age Score: 1 Gender Score: 0   CrCl >170mL/min Platelet count 188K  Per office protocol, patient can hold Eliquis for 2 days prior to procedure.    **This guidance is not considered finalized until pre-operative APP has relayed final recommendations.**

## 2023-09-06 NOTE — Telephone Encounter (Signed)
Name: Matthew Jordan  DOB: 06/23/1951  MRN: [id number]  Primary Cardiologist: Debbe Odea, MD   Preoperative team, please contact this patient and set up a phone call appointment for further preoperative risk assessment. Please obtain consent and complete medication review. Thank you for your help.  I confirm that guidance regarding antiplatelet and oral anticoagulation therapy has been completed and, if necessary, noted below.  Per office protocol, patient can hold Eliquis for 2 days prior to procedure.   I also confirmed the patient resides in the state of [place]. As per [facility] Medical Board telemedicine laws, the patient must reside in the state in which the provider is licensed.   Ronney Asters, NP 09/06/2023, 2:53 PM [facility]

## 2023-09-06 NOTE — Telephone Encounter (Signed)
I tried to call the pt and left message to call back ASAP. Pt will need to be added on tomorrow for tele pre op appt due to procedure date and med hold.

## 2023-09-07 ENCOUNTER — Ambulatory Visit: Payer: Medicare HMO | Attending: General Practice

## 2023-09-07 ENCOUNTER — Encounter
Admission: RE | Admit: 2023-09-07 | Discharge: 2023-09-07 | Disposition: A | Discharge status: Home / Self Care | Payer: Medicare HMO | Source: Ambulatory Visit | Attending: Urology | Admitting: Urology

## 2023-09-07 ENCOUNTER — Telehealth: Payer: Self-pay | Admitting: *Deleted

## 2023-09-07 ENCOUNTER — Encounter: Payer: Self-pay | Admitting: Urology

## 2023-09-07 DIAGNOSIS — I2510 Atherosclerotic heart disease of native coronary artery without angina pectoris: Secondary | ICD-10-CM | POA: Insufficient documentation

## 2023-09-07 DIAGNOSIS — E669 Obesity, unspecified: Secondary | ICD-10-CM | POA: Insufficient documentation

## 2023-09-07 DIAGNOSIS — Z01812 Encounter for preprocedural laboratory examination: Secondary | ICD-10-CM | POA: Diagnosis not present

## 2023-09-07 DIAGNOSIS — I10 Essential (primary) hypertension: Secondary | ICD-10-CM | POA: Insufficient documentation

## 2023-09-07 DIAGNOSIS — I2583 Coronary atherosclerosis due to lipid rich plaque: Secondary | ICD-10-CM | POA: Diagnosis not present

## 2023-09-07 DIAGNOSIS — Z01810 Encounter for preprocedural cardiovascular examination: Secondary | ICD-10-CM | POA: Diagnosis not present

## 2023-09-07 DIAGNOSIS — I480 Paroxysmal atrial fibrillation: Secondary | ICD-10-CM

## 2023-09-07 DIAGNOSIS — Z01818 Encounter for other preprocedural examination: Secondary | ICD-10-CM | POA: Diagnosis present

## 2023-09-07 DIAGNOSIS — R7303 Prediabetes: Secondary | ICD-10-CM | POA: Insufficient documentation

## 2023-09-07 LAB — BASIC METABOLIC PANEL
Anion gap: 11 (ref 5–15)
BUN: 15 mg/dL (ref 8–23)
CO2: 24 mmol/L (ref 22–32)
Calcium: 9 mg/dL (ref 8.9–10.3)
Chloride: 105 mmol/L (ref 98–111)
Creatinine, Ser: 0.72 mg/dL (ref 0.61–1.24)
GFR, Estimated: >60 mL/min (ref >60)
Glucose, Bld: 131 mg/dL — ABNORMAL HIGH (ref 70–99)
Potassium: 3.1 mmol/L — ABNORMAL LOW (ref 3.5–5.1)
Sodium: 140 mmol/L (ref 135–145)

## 2023-09-07 LAB — CBC
HCT: 45.4 % (ref 39.0–52.0)
Hemoglobin: 15.3 g/dL (ref 13.0–17.0)
MCH: 30.6 pg (ref 26.0–34.0)
MCHC: 33.7 g/dL (ref 30.0–36.0)
MCV: 90.8 fL (ref 80.0–100.0)
Platelets: 211 10*3/uL (ref 150–400)
RBC: 5 MIL/uL (ref 4.22–5.81)
RDW: 12.7 % (ref 11.5–15.5)
WBC: 5.9 10*3/uL (ref 4.0–10.5)
nRBC: 0 % (ref 0.0–0.2)

## 2023-09-07 MED ORDER — DILTIAZEM HCL ER COATED BEADS 240 MG PO CP24: 240.0000 mg | ORAL_CAPSULE | Freq: Every day | ORAL | 3 refills | Status: DC

## 2023-09-07 MED ORDER — APIXABAN 5 MG PO TABS: 5.0000 mg | ORAL_TABLET | Freq: Two times a day (BID) | ORAL | 3 refills | Status: DC

## 2023-09-07 NOTE — Telephone Encounter (Signed)
S/w the pt's daughter who gives the consent for tele appt. Per Quentin Mulling, FNP pt has stopped all medications except:  Ok spoke with the daughter. She is going to call him and tell him to call. Off all meds except that eye gtt and zestorectic. Has been off for months.  I have confirmed the medications with the pt's daughter as well.

## 2023-09-07 NOTE — Progress Notes (Signed)
Virtual Visit via Telephone Note   Because of Matthew Jordan's co-morbid illnesses, he is at least at moderate risk for complications without adequate follow up.  This format is felt to be most appropriate for this patient at this time.  The patient did not have access to video technology/had technical difficulties with video requiring transitioning to audio format only (telephone).  All issues noted in this document were discussed and addressed.  No physical exam could be performed with this format.  Please refer to the patient's chart for his consent to telehealth for [facility].  Evaluation Performed:  Preoperative cardiovascular risk assessment _____________   Date:  09/07/2023   Patient ID:  Matthew Jordan, DOB April 18, 1951, MRN [id number] Patient Location:  Home Provider location:   Office  Primary Care Provider:  Debera Lat, PA-C Primary Cardiologist:  Debbe Odea, MD  Chief Complaint / Patient Profile   72 y.o. y/o male with a h/o coronary artery disease, diastolic CHF, paroxysmal atrial fibrillation who is pending HOLEP-LASER ENUCLEATION OF THE PROSTATE WITH MORCELLATION  and presents today for telephonic preoperative cardiovascular risk assessment.  History of Present Illness    Matthew Jordan is a 72 y.o. male who presents via audio/video conferencing for a telehealth visit today.  Pt was last seen in cardiology clinic on 03/29/2023 by Dr. Azucena Cecil.  At that time Matthew Jordan was doing well .  The patient is now pending procedure as outlined above. Since his last visit, he continues to be stable from a cardiac standpoint.  He did report an episode of elevated blood pressure and chest discomfort while in [place].  He denies further episodes of chest discomfort and his blood pressure is now well-controlled.  He attributed elevated blood pressure to high salt diet.  He also reports not taking his diltiazem and apixaban medication.  I will refill  his diltiazem and apixaban.  I recommend that he resume diltiazem and also recommended that he resume apixaban after prostate surgery.  He expressed understanding.  Today he denies chest pain, shortness of breath, lower extremity edema, fatigue,  melena, hematuria, hemoptysis, diaphoresis, weakness, presyncope, syncope, orthopnea, and PND.   Past Medical History    Past Medical History:  Diagnosis Date   (HFpEF) heart failure with preserved ejection fraction (HCC)    a.) stress TTE 04/26/2019: EF >55%, mod LVH, triv MR/TR/PR, PASP 26.4; b.) TTE 09/25/2020: EF >75%, no RWMAs, G2DD   Aortic atherosclerosis (HCC)    Arthritis    BPH with urinary obstruction    CAD (coronary artery disease) 2014   a.) LHC in 2014 while in [place]: 40% LAD - med mgmt; b.) MV 09/16/2021: no ischemia   Chest pain    Cholelithiasis    Dysphagia    Elevated PSA    Erectile dysfunction    a.) on PDE5i (tadalafil)   GERD (gastroesophageal reflux disease)    Hepatic cyst    HLD (hyperlipidemia)    Hypertension    On apixaban therapy    OSA (obstructive sleep apnea)    a.) does not utilize noctural PAP therapy   PAF (paroxysmal atrial fibrillation) (HCC)    a.) CHA2DS2-VASc = 4 (age, HFpEF, HTN, vascular disease history) as of 09/06/2023; b.) cardiac rate/rhythm maintained on oral diltiazem; chronically anticoagulated using apixaban   SOB (shortness of breath)    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 aneurysmal)  --> further assessed by CTA  and noted to be tortuosity of the aorta rather than aneurysm   Past Surgical History:  Procedure Laterality Date   CORONARY ANGIOPLASTY WITH STENT PLACEMENT  11/15/2012   Location: [place]   EYE SURGERY Right 01/2023   HERNIA REPAIR      Allergies  Allergies  Allergen Reactions   Amoxicillin Other (See Comments)    Home Medications    Prior to Admission medications   Medication Sig Start Date End Date  Taking? Authorizing Provider  apixaban (ELIQUIS) 5 MG TABS tablet Take 1 tablet (5 mg total) by mouth 2 (two) times daily. Patient not taking: Reported on 09/06/2023 12/29/22 04/28/23  Furth, Cadence H, PA-C  clotrimazole (CLOTRIMAZOLE ANTI-FUNGAL) 1 % cream Apply 1 Application topically 2 (two) times daily. Patient not taking: Reported on 09/06/2023 04/27/23   Poggi, Herb Grays, PA-C  clotrimazole-betamethasone (LOTRISONE) cream Apply 1 Application topically 2 (two) times daily. Patient not taking: Reported on 09/06/2023 01/04/23   Felecia Shelling, DPM  diltiazem (CARDIZEM CD) 240 MG 24 hr capsule Take 1 capsule (240 mg total) by mouth daily. Patient not taking: Reported on 09/06/2023 12/29/22 12/24/23  Furth, Cadence H, PA-C  furosemide (LASIX) 40 MG tablet TAKE 1 TABLET BY MOUTH DAILY AS NEEDED Patient not taking: Reported on 09/06/2023 01/21/23   Debbe Odea, MD  lisinopril-hydrochlorothiazide (ZESTORETIC) 10-12.5 MG tablet Take 1 tablet by mouth daily. 12/29/22 12/24/23  Furth, Cadence H, PA-C  nitrofurantoin, macrocrystal-monohydrate, (MACROBID) 100 MG capsule Take 1 capsule (100 mg total) by mouth every 12 (twelve) hours. 08/31/23   Sondra Come, MD  nystatin (MYCOSTATIN/NYSTOP) powder Apply 1 Application topically 3 (three) times daily. Patient not taking: Reported on 09/06/2023 04/21/23   Sondra Come, MD  ROCKLATAN 0.02-0.005 % SOLN Apply 1 drop to eye at bedtime. 04/19/22   [provider]  tadalafil (CIALIS) 5 MG tablet Take 1-2 tablets (5-10 mg total) by mouth daily as needed for erectile dysfunction (take 1 hour prior to sexual activity). Patient not taking: Reported on 09/06/2023 01/19/23   Sondra Come, MD  tamsulosin (FLOMAX) 0.4 MG CAPS capsule TAKE 1 CAPSULE BY MOUTH EVERY DAY 08/15/23   Ostwalt, Edmon Crape, PA-C  terbinafine (LAMISIL) 250 MG tablet Take 1 tablet (250 mg total) by mouth daily. Patient not taking: Reported on 09/06/2023 01/04/23   Felecia Shelling, DPM  traMADol  (ULTRAM) 50 MG tablet Take 0.5-1 tablets (25-50 mg total) by mouth every 12 (twelve) hours as needed for moderate pain. Patient not taking: Reported on 09/06/2023 10/22/20   Berniece Pap, FNP    Physical Exam    Vital Signs:  Matthew Jordan does not have vital signs available for review today.  Given telephonic nature of communication, physical exam is limited. AAOx3. NAD. Normal affect.  Speech and respirations are unlabored.  Accessory Clinical Findings    None  Assessment & Plan    1.  Preoperative Cardiovascular Risk Assessment:HOLEP-LASER ENUCLEATION OF THE PROSTATE WITH MORCELLATION , 09/09/2023 , Dr. [name], MD , Matthew Mulling, FNP-C , Fax: [phone]       Primary Cardiologist: Debbe Odea, MD  Chart reviewed as part of pre-operative protocol coverage. Given past medical history and time since last visit, based on ACC/AHA guidelines, Matthew Jordan would be at acceptable risk for the planned procedure without further cardiovascular testing.   His RCRI is moderate risk, 6.6% risk of major cardiac event.  He is able to complete greater than 4 METS of physical activity.  Patient with diagnosis of afib on Eliquis for anticoagulation.     Procedure: HOLEP-LASER ENUCLEATION OF THE PROSTATE WITH MORCELLATION    Date of procedure: 09/09/23   CHA2DS2-VASc Score = 4  This indicates a 4.8% annual risk of stroke. The patient's score is based upon: CHF History: 1 HTN History: 1 Diabetes History: 0 Stroke History: 0 Vascular Disease History: 1 Age Score: 1 Gender Score: 0   CrCl >168mL/min Platelet count 188K   Per office protocol, patient can hold Eliquis for 2 days prior to procedure.   Patient was advised that if he develops new symptoms prior to surgery to contact our office to arrange a follow-up appointment.  He verbalized understanding.  I will route this recommendation to the requesting party via Epic fax function and remove from pre-op  pool.       Time:   Today, I have spent 9 minutes with the patient with telehealth technology discussing medical history, symptoms, and management plan.  Prior to patient's phone evaluation I spent greater than 10 minutes reviewing their past medical history and cardiac medications.    Matthew Asters, NP  09/07/2023, 8:57 AM

## 2023-09-07 NOTE — Progress Notes (Signed)
Catheter Removal Patient underwent HoLEP procedure on September 09, 2023.  Postprocedural course was as expected and uneventful.  Prostate to pathology still pending prostate chip pathology still pending.  Patient is present today for a catheter removal.  58 ml of water was drained from the balloon. A 24FR 3- way foley cath was removed from the bladder, no complications were noted. Patient tolerated well.  Performed by: Michiel Cowboy, PA-C   Follow up/ Additional notes: Reviewed what to expect now that the catheter is out.  Reviewed return to clinic precautions.  Return for February w/ Dr. Richardo Hanks .

## 2023-09-07 NOTE — Telephone Encounter (Signed)
Ok per Pre OP APP Edd Fabian, FNP to add pt to 9:40 today.

## 2023-09-07 NOTE — Progress Notes (Signed)
Perioperative / Anesthesia Services  Pre-Admission Testing Clinical Review / Pre-Operative Anesthesia Consult  Date: 09/08/23  Patient Demographics:  Name: Matthew Jordan DOB:   [birth year] MRN:   [id number]  Planned Surgical Procedure(s):    Case: [id number] Date/Time: 09/09/23 0715   Procedure: HOLEP-LASER ENUCLEATION OF THE PROSTATE WITH MORCELLATION   Anesthesia type: General   Pre-op diagnosis: Benign Prostatic Hyperplasia with Urinary Obstruction   Location: [facility] OR ROOM 10 / [facility]   Surgeons: Sondra Come, MD     NOTE: Available PAT nursing documentation and vital signs have been reviewed. Clinical nursing staff has updated patient's PMH/PSHx, current medication list, and drug allergies/intolerances to ensure comprehensive history available to assist in medical decision making as it pertains to the aforementioned surgical procedure and anticipated anesthetic course. Extensive review of available clinical information personally performed. [place] PMH and PSHx updated with any diagnoses/procedures that  may have been inadvertently omitted during his intake with the pre-admission testing department's nursing staff.  Clinical Discussion:  Matthew Jordan is a 72 y.o. male who is submitted for pre-surgical anesthesia review and clearance prior to him undergoing the above procedure. Patient is a Former Smoker (2.5 pack years; quit 09/1980). Pertinent PMH includes: CAD, HFpEF, PAF, aortic atherosclerosis, angina, aortic tortuosity, HTN, HLD, dyspnea, OSAH (does not require nocturnal PAP therapy), dysphagia, GERD (no daily Tx), ED (on PDE5i), OA, BPH.  Patient is followed by cardiology Azucena Cecil, MD). He was last seen in the cardiology clinic on 03/29/2023; notes reviewed. At the time of his clinic visit, patient doing well overall from a cardiovascular perspective. Patient denied any chest pain, shortness of breath, PND, orthopnea, palpitations,  significant peripheral edema, weakness, fatigue, vertiginous symptoms, or presyncope/syncope. Patient with a past medical history significant for cardiovascular diagnoses. Documented physical exam was grossly benign, providing no evidence of acute exacerbation and/or decompensation of the patient's known cardiovascular conditions.  Of note, complete records regarding patient's cardiovascular history unavailable for review at time of consult.  Patient has received care outside of the state of [place].  Information gathered from patient report and from notes provided by his local cardiologist.  Patient reportedly underwent diagnostic LEFT heart catheterization in 2014 while living in [place].  Study revealed mild 40% lesion in the LAD.  Given the nonobstructive nature of his coronary artery disease, the decision was made to defer intervention opting for medical management.  Patient underwent stress echocardiogram on 04/26/2019 revealing a normal left ventricular systolic function with an EF of greater than 55%. There was mild concentric LVH. Patient was stressed for a total of 6 minutes and 49 seconds achieving a maximum heart rate of 137 bpm.  No valvular significant valvular abnormalities noted; trivial mitral, tricuspid, and pulmonary valve regurgitation.  RVSP 26.4 mmHg.  Right ventricular size and function normal.  All gradients were normal; no valvular stenosis.  Aorta normal in size with no aneurysmal dilatation.  TTE was performed on 09/25/2020 revealing a normal left ventricular systolic function with a hyperdynamic LVEF of greater than 75%.  There were no regional wall motion abnormalities. Left ventricular diastolic Doppler parameters consistent with pseudonormalization (G2DD).  Right ventricular size and function was normal.  There was no significant valvular regurgitation. All transvalvular gradients were noted to be normal providing no evidence suggestive of valvular stenosis. Aorta normal in size with  no evidence of aneurysmal dilatation.  Myocardial perfusion imaging study was performed on 09/16/2021 revealing a low normal left ventricular systolic function with an  EF of 51%.  There was no evidence of stress-induced myocardial ischemia or arrhythmia; no scintigraphic evidence of scar.  Study determined to be normal and low risk.  Patient with an atrial fibrillation diagnosis; CHA2DS2-VASc Score = 4 (age, HFpEF, HTN, vascular disease history). His rate and rhythm are currently being maintained on oral diltiazem. He is chronically anticoagulated using apixaban; reported to be compliant with therapy with no evidence or reports of GI/GU bleeding.  Blood pressure reasonably controlled at 130/80 mmHg on currently prescribed ACEi/diuretic (lisinopril/HCTZ), CCB (diltiazem), and diuretic (furosemide) therapies.  Patient not currently taking any type of lipid-lowering therapies for his HLD diagnosis or further ASCVD prevention.  Of note, in the setting of known cardiovascular diagnoses, it is important to point out that patient is on a PDE5i (tadalafil) for and erectile dysfunction diagnosis. Patient is not diabetic.  He does have an OSAH diagnosis, however does not utilize nocturnal PAP therapy. Patient is able to complete all of his  ADL/IADLs without cardiovascular limitation.  Per the DASI, patient is able to achieve at least 4 METS of physical activity without experiencing any significant degree of angina/anginal equivalent symptoms.  No changes were made to his daily medication regimen.  Patient to follow-up with outpatient cardiology in 6 months or sooner if needed.  HUXON LEVIER is scheduled for a HOLEP-LASER ENUCLEATION OF THE PROSTATE WITH MORCELLATION on 09/09/2023 with Dr. [name], MD.  Given patient's past medical history significant for cardiovascular diagnoses, presurgical cardiac clearance was sought by the PAT team.  Per cardiology, "given past medical history and time since last visit,  based on ACC/AHA guidelines, NEHEMIAH SNARR would be at acceptable risk for the planned procedure without further cardiovascular testing.  His RCRI is moderate risk, 6.6% risk of major cardiac event.  He is able to complete greater than 4 METS of physical activity. He reports not taking his diltiazem and apixaban medication. I will refill his diltiazem and apixaban. I recommend that he resume diltiazem and also recommended that he resume apixaban after prostate surgery".  Patient denies previous perioperative complications with anesthesia in the past.  In review his EMR, there are no records available for review pertaining to any procedural/anesthetic courses within the [facility] system in the recent past.     09/06/2023   10:04 AM 09/05/2023    1:24 PM 08/31/2023    8:49 AM  Vitals with BMI  Height 5\' 9"   5\' 10"   Weight 227 lbs 15 oz  228 lbs  BMI 33.65  32.71  Systolic  133 138  Diastolic  540 84  Pulse  70 63    Providers/Specialists:   NOTE: Primary physician provider listed below. Patient may have been seen by APP or partner within same practice.   PROVIDER ROLE / SPECIALTY LAST Lise Auer, MD Urology (Surgeon) 08/31/2023  Debera Lat, PA-C Primary Care Provider 03/16/2023  Debbe Odea, MD Cardiology 03/29/2023; update preop APP call on 09/07/2023   Allergies:  Amoxicillin  Current Home Medications:   No current facility-administered medications for this encounter.    apixaban (ELIQUIS) 5 MG TABS tablet   clotrimazole (CLOTRIMAZOLE ANTI-FUNGAL) 1 % cream   clotrimazole-betamethasone (LOTRISONE) cream   diltiazem (CARDIZEM CD) 240 MG 24 hr capsule   furosemide (LASIX) 40 MG tablet   lisinopril-hydrochlorothiazide (ZESTORETIC) 10-12.5 MG tablet   nitrofurantoin, macrocrystal-monohydrate, (MACROBID) 100 MG capsule   nystatin (MYCOSTATIN/NYSTOP) powder   ROCKLATAN 0.02-0.005 % SOLN   tadalafil (CIALIS) 5 MG tablet  tamsulosin (FLOMAX) 0.4 MG CAPS  capsule   terbinafine (LAMISIL) 250 MG tablet   traMADol (ULTRAM) 50 MG tablet   History:   Past Medical History:  Diagnosis Date   (HFpEF) heart failure with preserved ejection fraction (HCC)    a.) stress TTE 04/26/2019: EF >55%, mod LVH, triv MR/TR/PR, PASP 26.4; b.) TTE 09/25/2020: EF >75%, no RWMAs, G2DD   Aortic atherosclerosis (HCC)    Arthritis    BPH with urinary obstruction    CAD (coronary artery disease) 2014   a.) LHC in 2014 while in [place]: 40% LAD - med mgmt; b.) MV 09/16/2021: no ischemia   Chest pain    Cholelithiasis    Dysphagia    Elevated PSA    Erectile dysfunction    a.) on PDE5i (tadalafil)   GERD (gastroesophageal reflux disease)    Hepatic cyst    HLD (hyperlipidemia)    Hypertension    On apixaban therapy    OSA (obstructive sleep apnea)    a.) does not utilize noctural PAP therapy   PAF (paroxysmal atrial fibrillation) (HCC)    a.) CHA2DS2-VASc = 4 (age, HFpEF, HTN, vascular disease history) as of 09/06/2023; b.) cardiac rate/rhythm maintained on oral diltiazem; chronically anticoagulated using apixaban   SOB (shortness of breath)    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 aneurysmal)  --> further assessed by CTA and noted to be tortuosity of the aorta rather than aneurysm   Past Surgical History:  Procedure Laterality Date   CORONARY ANGIOPLASTY WITH STENT PLACEMENT  11/15/2012   Location: [place]   EYE SURGERY Right 01/2023   HERNIA REPAIR     Family History  Problem Relation Age of Onset   Hypertension Mother    Lung disease Mother    Hypertension Father    Cancer Father        possible prostate cancer   Kidney disease Neg Hx    Social History   Tobacco Use   Smoking status: Former    Current packs/day: 0.00    Average packs/day: 0.3 packs/day for 10.0 years (2.5 ttl pk-yrs)    Types: Cigarettes    Start date: 10/02/1970    Quit date: 10/02/1980    Years since quitting:  42.9    Passive exposure: Past   Smokeless tobacco: Never  Vaping Use   Vaping status: Never Used  Substance Use Topics   Alcohol use: Yes    Alcohol/week: 2.0 standard drinks of alcohol    Types: 2 Cans of beer per week    Comment: occa   Drug use: No    Pertinent Clinical Results:  LABS:   Hospital Outpatient Visit on 09/07/2023  Component Date Value Ref Range Status   WBC 09/07/2023 5.9  4.0 - 10.5 K/uL Final   RBC 09/07/2023 5.00  4.22 - 5.81 MIL/uL Final   Hemoglobin 09/07/2023 15.3  13.0 - 17.0 g/dL Final   HCT 84/13/2440 45.4  39.0 - 52.0 % Final   MCV 09/07/2023 90.8  80.0 - 100.0 fL Final   MCH 09/07/2023 30.6  26.0 - 34.0 pg Final   MCHC 09/07/2023 33.7  30.0 - 36.0 g/dL Final   RDW 09/11/2535 12.7  11.5 - 15.5 % Final   Platelets 09/07/2023 211  150 - 400 K/uL Final   nRBC 09/07/2023 0.0  0.0 - 0.2 % Final   Performed at [facility] [address] [place], [place] [address]  Sodium 09/07/2023 140  135 - 145 mmol/L Final   Potassium 09/07/2023 3.1 (L)  3.5 - 5.1 mmol/L Final   Chloride 09/07/2023 105  98 - 111 mmol/L Final   CO2 09/07/2023 24  22 - 32 mmol/L Final   Glucose, Bld 09/07/2023 131 (H)  70 - 99 mg/dL Final   Glucose reference range applies only to samples taken after fasting for at least 8 hours.   BUN 09/07/2023 15  8 - 23 mg/dL Final   Creatinine, Ser 09/07/2023 0.72  0.61 - 1.24 mg/dL Final   Calcium 95/62/1308 9.0  8.9 - 10.3 mg/dL Final   GFR, Estimated 09/07/2023 >60  >60 mL/min Final   Comment: (NOTE) Calculated using the CKD-EPI Creatinine Equation (2021)    Anion gap 09/07/2023 11  5 - 15 Final   Performed at [facility], [address] [place], [place] [address]    ECG: Date: 03/29/2023 Time ECG obtained: 0849 AM Rate: 67 bpm Rhythm:  Normal sinus rhythm; LAFB Intervals: PR 176 ms. QRS 110 ms. QTc 469 ms. ST segment and T wave changes: No evidence of acute ST segment elevation or depression.  Evidence of a  possible age undetermined anterior infarct present. Comparison: Similar to previous tracing obtained on 12/29/2022   IMAGING / PROCEDURES: MYOCARDIAL PERFUSION IMAGING STUDY (LEXISCAN) performed on 09/16/2021 Low normal left ventricular systolic function with an EF of 51% No evidence of stress-induced myocardial ischemia or arrhythmia; no sonographic evidence of scar Normal low risk study  TRANSTHORACIC ECHOCARDIOGRAM performed on 09/25/2020 Left ventricular ejection fraction, by estimation, is >75%. The left ventricle has hyperdynamic function. The left ventricle has no regional wall motion abnormalities. Left ventricular diastolic parameters are consistent with Grade II diastolic dysfunction (pseudonormalization).  Right ventricular systolic function is normal. The right ventricular size is normal.  The mitral valve is normal in structure. No evidence of mitral valve regurgitation.  The aortic valve is grossly normal. Aortic valve regurgitation is not visualized.   Impression and Plan:  Matthew Jordan has been referred for pre-anesthesia review and clearance prior to him undergoing the planned anesthetic and procedural courses. Available labs, pertinent testing, and imaging results were personally reviewed by me in preparation for upcoming operative/procedural course. [facility] medical record has been updated following extensive record review and patient interview with PAT staff.   This patient has been appropriately cleared by cardiology with an overall ACCEPTABLE risk of experiencing significant perioperative cardiovascular complications. Based on clinical review performed today (09/08/23), barring any significant acute changes in the patient's overall condition, it is anticipated that he will be able to proceed with the planned surgical intervention. Any acute changes in clinical condition may necessitate his procedure being postponed and/or cancelled. Patient will meet with anesthesia  team (MD and/or CRNA) on the day of his procedure for preoperative evaluation/assessment. Questions regarding anesthetic course will be fielded at that time.   Pre-surgical instructions were reviewed with the patient during his PAT appointment, and questions were fielded to satisfaction by PAT clinical staff. He has been instructed on which medications that he will need to hold prior to surgery, as well as the ones that have been deemed safe/appropriate to take on the day of his procedure. As part of the general education provided by PAT, patient made aware both verbally and in writing, that he would need to abstain from the use of any illegal substances during his perioperative course.  He was advised that failure to follow the provided instructions  could necessitate case cancellation or result in serious perioperative complications up to and including death. Patient encouraged to contact PAT and/or his surgeon's office to discuss any questions or concerns that may arise prior to surgery; verbalized understanding.   Quentin Mulling, MSN, APRN, FNP-C, CEN [facility]  Perioperative Services Nurse Practitioner Phone: [phone] Fax: [phone] 09/08/23 11:56 AM  NOTE: This note has been prepared using Dragon dictation software. Despite my best ability to proofread, there is always the potential that unintentional transcriptional errors may still occur from this process.

## 2023-09-07 NOTE — Telephone Encounter (Signed)
2nd attempt to reach the pt to add for tele pre op appt today, see previous notes. I have d/w Quentin Mulling, FNP with Dr. Inda Coke office that we have attempted to reach the pt x 2 to schedule a tele appt today but pt has not returned our calls.   Per Quentin Mulling, FNP: He should be here within the hour. I have a spanish interpreter scheduled to be here when he comes in just in case to make sure he calls you ASAP. Says he speaks english, though just want to be sure.

## 2023-09-07 NOTE — Telephone Encounter (Signed)
S/w the pt's daughter who gives the consent for tele appt. Per Quentin Mulling, FNP pt has stopped all medications except:  Ok spoke with the daughter. She is going to call him and tell him to call. Off all meds except that eye gtt and zestorectic. Has been off for months.   I have confirmed the medications with the pt's daughter as well.       I d/w Quentin Mulling, FNP about the pt and his daughter going by his office to do the tele appt. Pt's daughter tells me that the pt is having lab work right now and she tried to call him but he did not answer. Per Quentin Mulling, FNP ok to have the pt and his daughter to come by his office. Once there Judie Grieve will call my line and I will transfer the call to Edd Fabian, FNP. All parties in agreement to plan of care for the pt today.

## 2023-09-08 ENCOUNTER — Telehealth: Payer: Self-pay | Admitting: Urgent Care

## 2023-09-08 DIAGNOSIS — R4589 Other symptoms and signs involving emotional state: Secondary | ICD-10-CM

## 2023-09-08 DIAGNOSIS — Z79899 Other long term (current) drug therapy: Secondary | ICD-10-CM

## 2023-09-08 DIAGNOSIS — Z01812 Encounter for preprocedural laboratory examination: Secondary | ICD-10-CM

## 2023-09-08 DIAGNOSIS — E876 Hypokalemia: Secondary | ICD-10-CM

## 2023-09-08 MED ORDER — LACTATED RINGERS IV SOLN: INTRAVENOUS | Status: DC

## 2023-09-08 MED ORDER — POTASSIUM CHLORIDE CRYS ER 20 MEQ PO TBCR: 20.0000 meq | EXTENDED_RELEASE_TABLET | Freq: Every day | ORAL | 0 refills | Status: DC

## 2023-09-08 MED ORDER — POTASSIUM CHLORIDE CRYS ER 20 MEQ PO TBCR: EXTENDED_RELEASE_TABLET | ORAL | 0 refills | Status: DC

## 2023-09-08 MED ORDER — CIPROFLOXACIN IN D5W 400 MG/200ML IV SOLN
400.0000 mg | INTRAVENOUS | Status: AC
  Administered 2023-09-09: 400 mg via INTRAVENOUS

## 2023-09-08 MED ORDER — ORAL CARE MOUTH RINSE: 15.0000 mL | Freq: Once | OROMUCOSAL | Status: AC

## 2023-09-08 MED ORDER — CHLORHEXIDINE GLUCONATE 0.12 % MT SOLN
15.0000 mL | Freq: Once | OROMUCOSAL | Status: AC
  Administered 2023-09-09: 15 mL via OROMUCOSAL

## 2023-09-08 NOTE — Progress Notes (Signed)
[facility] Perioperative Services: Pre-Admission/Anesthesia Testing  Abnormal Lab Notification and Treatment Plan of Care   Date: 09/08/23  Name: Matthew Jordan MRN:   [id number]  Re: Abnormal labs noted during PAT appointment   Notified:  Provider Name Provider Role Notification Mode  [name], MD Urology (Surgeon) Routed and/or faxed via Deberah Castle, PA-C Primary Care Provider Routed and/or faxed via [facility]   Clinical Information and Notes:  ABNORMAL LAB VALUE(S): Lab Results  Component Value Date   K 3.1 (L) 09/07/2023   Matthew Jordan is scheduled for an elective HOLEP-LASER ENUCLEATION OF THE PROSTATE WITH MORCELLATION on 09/09/2023. In review of his medication reconciliation, it is noted that the patient is taking prescribed diuretic medications (furosemide and HCTZ) daily.   Please note, in efforts to promote a safe and effective anesthetic course, per current guidelines/standards set by the [facility] anesthesia team, the minimal acceptable K+ level for the patient to proceed with general anesthesia is 3.0 mmol/L. With that being said, if the patient drops any lower, his elective procedure will need to be postponed until K+ is better optimized. In efforts to prevent case cancellation, will make efforts to optimize pre-surgical K+ level so that patient can safely undergo the planned surgical intervention.   In speaking with the patient's daughter, she notes that her father is very afraid to have surgery. She states that she does not think that he is going to go through with the procedure. Actively listened to concerns. Daughter asking for a "before surgery counselor" to speak with patient to tell him what is happening and what to expect. Daughter feels like this will make him feel better about the whole thing.   Daughter finally asked that surgery be postponed for a couple of weeks to allow patient time to calm down and think about it more.  Daughter states, "he is so upset. I don't know what to do. He is like even suicidal at this point". I explained to daughter that if she had concerns for patient's safety, then she should seek help in the local ED. This could be accomplished by calling EMS and/or law enforcement if patient refused to go, or if he made any attempts to harm himself or others. Daughter states, "now that I have cancelled his surgery, he is going to feel better about things. He will be ok". I reinforced the previous directives; daughter verbalized understanding.   Impression and Plan:  Matthew Jordan found to be HYPOkalemic at 3.1 mmol/L on preoperative labs.   Called patient to discuss results and plans for correction of noted electrolyte derangement. He is on thiazide and loop diuretic therapy. Patient does not take a daily K+ supplement. Discussed diuretic therapy as likely etiology in the setting of normal Mg level and in the absence of GI related symptoms (no diarrhea). Patient denies regular use of laxative medications. Reviewed decreased intake as potentially being contributory as well. Reviewed plans for preoperative optimization.  With K+ correction, encouraged patient to follow up with PCP about 2-3 weeks to have labs rechecked to ensure that levels are remaining within normal range. Discussed nutritional intake of K+ rich foods as an adjunctive way to keep his K+ levels normal; list of K+ rich foods provided. Also mentioned ORS, however advised him not to rely solely on these drinks, as they are high in Na+, and he has a HTN diagnosis. discussed that PCP may elect to pursue a change in diuretic therapy to a K+  sparing type medication, or alternatively, they may consider adding a daily K+ supplement if levels remain low on recheck. Order entered to recheck K+ on the day of his surgery to ensure optimization.  Advised daughter that I would have surgery cancelled for tomorrow. I encouraged her to call for help if she  has concerns that her father may harm himself. She indicates that she really thinks that he will "get better now that surgery is not happening". Regarding further surgical information, I advised daughter that I would have surgical coordinator at surgeon's office reach out to them to discuss further.   Called office to discuss case cancellation with surgery scheduler. Will send copy of this note to surgeon and PCP to make them aware of K+ level and plans for correction. Wished daughter the best of luck with current situation and offered for her to reach back out to me with further questions and concerns. Asked again that she have a low threshold of belief for any verbalized SI/HI with her father; verbalized understanding. Daughter was appreciative of the care/concern expressed by PAT staff.   Encounter Diagnoses  Name Primary?   Pre-operative laboratory examination Yes   Diuretic-induced hypokalemia    Long term current use of diuretic    Anxiety about health    ADDENDUM 09/08/2023 at 1305 PM - Received call from surgeon's office. Daughter is wanting to meet with surgeon again before surgery. MD has met with patient and dicussed this procedure multiple times. MD asking that patient come in for surgery and he can discuss it with him further, otherwise case will be cancelled and patient will need to find another urological provider. Daughter verbalized understanding and plans to bring patient in tomorrow morning for surgery. Initial Rx for K+ changed to allow for expedited optimization the day before surgery. New Rx sent; daughter updated and advises that she will pick up prescription for patient to take as prescribed.   Meds ordered this encounter  Medications   potassium chloride SA (KLOR-CON M) 20 MEQ tablet    Sig: Take 2 pills (40 mEq) today, then 1 pill (20 mEq) in the morning before surgery. Follow up with PCP for repeat labs.    Dispense:  3 tablet    Refill:  0    Disregard previous Rx. Call  left on your voicemail as well. Surgery is now taking place and need expedited K+ optimization. Call patient ASAP when available for pick up.   Quentin Mulling, MSN, APRN, FNP-C, CEN [facility]  Perioperative Services Nurse Practitioner Phone: [phone] 09/08/23 1:14 PM  NOTE: This note has been prepared using Dragon dictation software. Despite my best ability to proofread, there is always the potential that unintentional transcriptional errors may still occur from this process.

## 2023-09-09 ENCOUNTER — Other Ambulatory Visit: Payer: Self-pay

## 2023-09-09 ENCOUNTER — Ambulatory Visit
Admission: RE | Admit: 2023-09-09 | Discharge: 2023-09-09 | Disposition: A | Discharge status: Home / Self Care | Payer: Medicare HMO | Attending: Urology | Admitting: Urology

## 2023-09-09 ENCOUNTER — Encounter: Payer: Self-pay | Admitting: Urology

## 2023-09-09 ENCOUNTER — Ambulatory Visit: Payer: Medicare HMO | Admitting: Urgent Care

## 2023-09-09 ENCOUNTER — Encounter
Admission: RE | Disposition: A | Discharge status: Home / Self Care | Payer: Self-pay | Source: Home / Self Care | Attending: Urology

## 2023-09-09 DIAGNOSIS — K219 Gastro-esophageal reflux disease without esophagitis: Secondary | ICD-10-CM | POA: Insufficient documentation

## 2023-09-09 DIAGNOSIS — I110 Hypertensive heart disease with heart failure: Secondary | ICD-10-CM | POA: Insufficient documentation

## 2023-09-09 DIAGNOSIS — E785 Hyperlipidemia, unspecified: Secondary | ICD-10-CM | POA: Insufficient documentation

## 2023-09-09 DIAGNOSIS — Z955 Presence of coronary angioplasty implant and graft: Secondary | ICD-10-CM | POA: Insufficient documentation

## 2023-09-09 DIAGNOSIS — I5032 Chronic diastolic (congestive) heart failure: Secondary | ICD-10-CM | POA: Insufficient documentation

## 2023-09-09 DIAGNOSIS — E876 Hypokalemia: Secondary | ICD-10-CM

## 2023-09-09 DIAGNOSIS — Z87891 Personal history of nicotine dependence: Secondary | ICD-10-CM | POA: Diagnosis not present

## 2023-09-09 DIAGNOSIS — I700 Atherosclerosis of aorta: Secondary | ICD-10-CM | POA: Diagnosis not present

## 2023-09-09 DIAGNOSIS — N138 Other obstructive and reflux uropathy: Secondary | ICD-10-CM | POA: Insufficient documentation

## 2023-09-09 DIAGNOSIS — I480 Paroxysmal atrial fibrillation: Secondary | ICD-10-CM | POA: Diagnosis not present

## 2023-09-09 DIAGNOSIS — G4733 Obstructive sleep apnea (adult) (pediatric): Secondary | ICD-10-CM | POA: Insufficient documentation

## 2023-09-09 DIAGNOSIS — R310 Gross hematuria: Secondary | ICD-10-CM | POA: Diagnosis not present

## 2023-09-09 DIAGNOSIS — I2510 Atherosclerotic heart disease of native coronary artery without angina pectoris: Secondary | ICD-10-CM | POA: Diagnosis not present

## 2023-09-09 DIAGNOSIS — N401 Enlarged prostate with lower urinary tract symptoms: Secondary | ICD-10-CM | POA: Diagnosis present

## 2023-09-09 DIAGNOSIS — R338 Other retention of urine: Secondary | ICD-10-CM | POA: Insufficient documentation

## 2023-09-09 DIAGNOSIS — Z79899 Other long term (current) drug therapy: Secondary | ICD-10-CM

## 2023-09-09 DIAGNOSIS — Z01812 Encounter for preprocedural laboratory examination: Secondary | ICD-10-CM

## 2023-09-09 HISTORY — DX: Calculus of gallbladder without cholecystitis without obstruction: K80.20

## 2023-09-09 HISTORY — DX: Atherosclerosis of aorta: I70.0

## 2023-09-09 HISTORY — DX: Hyperlipidemia, unspecified: E78.5

## 2023-09-09 HISTORY — DX: Paroxysmal atrial fibrillation: I48.0

## 2023-09-09 HISTORY — DX: Obstructive sleep apnea (adult) (pediatric): G47.33

## 2023-09-09 HISTORY — DX: Male erectile dysfunction, unspecified: N52.9

## 2023-09-09 HISTORY — DX: Other specified diseases of liver: K76.89

## 2023-09-09 HISTORY — DX: Long term (current) use of anticoagulants: Z79.01

## 2023-09-09 HISTORY — DX: Unspecified diastolic (congestive) heart failure: I50.30

## 2023-09-09 HISTORY — PX: HOLEP-LASER ENUCLEATION OF THE PROSTATE WITH MORCELLATION: SHX6641

## 2023-09-09 LAB — POCT I-STAT, CHEM 8
BUN: 9 mg/dL (ref 8–23)
Calcium, Ion: 1.21 mmol/L (ref 1.15–1.40)
Chloride: 103 mmol/L (ref 98–111)
Creatinine, Ser: 0.6 mg/dL — ABNORMAL LOW (ref 0.61–1.24)
Glucose, Bld: 108 mg/dL — ABNORMAL HIGH (ref 70–99)
HCT: 44 % (ref 39.0–52.0)
Hemoglobin: 15 g/dL (ref 13.0–17.0)
Potassium: 3.6 mmol/L (ref 3.5–5.1)
Sodium: 141 mmol/L (ref 135–145)
TCO2: 22 mmol/L (ref 22–32)

## 2023-09-09 SURGERY — ENUCLEATION, PROSTATE, USING LASER, WITH MORCELLATION
Anesthesia: General

## 2023-09-09 MED ORDER — PROPOFOL 10 MG/ML IV BOLUS
INTRAVENOUS | Status: AC
  Filled 2023-09-09: qty 20

## 2023-09-09 MED ORDER — FENTANYL CITRATE (PF) 100 MCG/2ML IJ SOLN
INTRAMUSCULAR | Status: AC
  Filled 2023-09-09: qty 2

## 2023-09-09 MED ORDER — FENTANYL CITRATE (PF) 100 MCG/2ML IJ SOLN
25.0000 ug | INTRAMUSCULAR | Status: DC | PRN
  Administered 2023-09-09: 25 ug via INTRAVENOUS
  Administered 2023-09-09: 50 ug via INTRAVENOUS
  Administered 2023-09-09: 25 ug via INTRAVENOUS

## 2023-09-09 MED ORDER — OXYCODONE HCL 5 MG/5ML PO SOLN
5.0000 mg | Freq: Once | ORAL | Status: AC | PRN
Start: 2023-09-09 — End: 2023-09-09

## 2023-09-09 MED ORDER — LIDOCAINE HCL (CARDIAC) PF 100 MG/5ML IV SOSY
PREFILLED_SYRINGE | INTRAVENOUS | Status: DC | PRN
  Administered 2023-09-09: 80 mg via INTRAVENOUS

## 2023-09-09 MED ORDER — DEXAMETHASONE SODIUM PHOSPHATE 10 MG/ML IJ SOLN
INTRAMUSCULAR | Status: DC | PRN
  Administered 2023-09-09: 10 mg via INTRAVENOUS

## 2023-09-09 MED ORDER — OXYCODONE HCL 5 MG PO TABS
5.0000 mg | ORAL_TABLET | Freq: Once | ORAL | Status: AC | PRN
  Administered 2023-09-09: 5 mg via ORAL

## 2023-09-09 MED ORDER — ROCURONIUM BROMIDE 100 MG/10ML IV SOLN
INTRAVENOUS | Status: DC | PRN
  Administered 2023-09-09: 60 mg via INTRAVENOUS

## 2023-09-09 MED ORDER — SODIUM CHLORIDE (PF) 0.9 % IJ SOLN
INTRAMUSCULAR | Status: AC
  Filled 2023-09-09: qty 10

## 2023-09-09 MED ORDER — CHLORHEXIDINE GLUCONATE 0.12 % MT SOLN
OROMUCOSAL | Status: AC
  Filled 2023-09-09: qty 15

## 2023-09-09 MED ORDER — ONDANSETRON HCL 4 MG/2ML IJ SOLN
INTRAMUSCULAR | Status: DC | PRN
  Administered 2023-09-09: 4 mg via INTRAVENOUS

## 2023-09-09 MED ORDER — TRAMADOL HCL 50 MG PO TABS
50.0000 mg | ORAL_TABLET | Freq: Four times a day (QID) | ORAL | 0 refills | Status: AC | PRN
Start: 2023-09-09 — End: 2023-09-14

## 2023-09-09 MED ORDER — PROPOFOL 10 MG/ML IV BOLUS
INTRAVENOUS | Status: DC | PRN
  Administered 2023-09-09: 200 mg via INTRAVENOUS

## 2023-09-09 MED ORDER — PROPOFOL 10 MG/ML IV BOLUS
INTRAVENOUS | Status: AC
Start: 2023-09-09 — End: ?
  Filled 2023-09-09: qty 20

## 2023-09-09 MED ORDER — OXYCODONE HCL 5 MG PO TABS
ORAL_TABLET | ORAL | Status: AC
  Filled 2023-09-09: qty 1

## 2023-09-09 MED ORDER — GENTAMICIN SULFATE 40 MG/ML IJ SOLN
2.5000 mg/kg | Freq: Once | INTRAVENOUS | Status: AC
  Administered 2023-09-09: 260 mg via INTRAVENOUS
  Filled 2023-09-09: qty 6.5

## 2023-09-09 MED ORDER — SODIUM CHLORIDE 0.9 % IR SOLN
Status: DC | PRN
  Administered 2023-09-09: 6000 mL
  Administered 2023-09-09: 12000 mL

## 2023-09-09 MED ORDER — SUGAMMADEX SODIUM 200 MG/2ML IV SOLN
INTRAVENOUS | Status: DC | PRN
  Administered 2023-09-09: 100 mg via INTRAVENOUS
  Administered 2023-09-09: 200 mg via INTRAVENOUS

## 2023-09-09 MED ORDER — EPHEDRINE SULFATE-NACL 50-0.9 MG/10ML-% IV SOSY
PREFILLED_SYRINGE | INTRAVENOUS | Status: DC | PRN
  Administered 2023-09-09: 5 mg via INTRAVENOUS

## 2023-09-09 MED ORDER — CIPROFLOXACIN IN D5W 400 MG/200ML IV SOLN
INTRAVENOUS | Status: AC
  Filled 2023-09-09: qty 200

## 2023-09-09 MED ORDER — FENTANYL CITRATE (PF) 100 MCG/2ML IJ SOLN
INTRAMUSCULAR | Status: DC | PRN
  Administered 2023-09-09 (×2): 50 ug via INTRAVENOUS

## 2023-09-09 SURGICAL SUPPLY — 37 items
ADAPTER IRRIG TUBE 2 SPIKE SOL (ADAPTER) ×2 IMPLANT
ADPR TBG 2 SPK PMP STRL ASCP (ADAPTER) ×2
BAG DRN LRG CPC RND TRDRP CNTR (MISCELLANEOUS) ×1
BAG DRN RND TRDRP ANRFLXCHMBR (UROLOGICAL SUPPLIES) ×1
BAG URINE DRAIN 2000ML AR STRL (UROLOGICAL SUPPLIES) ×1 IMPLANT
BAG URO DRAIN 4000ML (MISCELLANEOUS) ×1 IMPLANT
CATH FOLEY 3WAY 30CC 24FR (CATHETERS) ×1
CATH URETL OPEN END 4X70 (CATHETERS) ×1 IMPLANT
CATH URTH STD 24FR FL 3W 2 (CATHETERS) ×1 IMPLANT
CONTAINER COLLECT MORCELLATR (MISCELLANEOUS) ×1 IMPLANT
DRAPE UTILITY 15X26 TOWEL STRL (DRAPES) IMPLANT
ELECT BIVAP BIPO 22/24 DONUT (ELECTROSURGICAL)
ELECTRD BIVAP BIPO 22/24 DONUT (ELECTROSURGICAL) IMPLANT
FIBER LASER MOSES 550 DFL (Laser) ×1 IMPLANT
FILTER OVERFLOW MORCELLATOR (FILTER) ×1 IMPLANT
GLOVE BIOGEL PI IND STRL 7.5 (GLOVE) ×1 IMPLANT
GOWN STRL REUS W/ TWL LRG LVL3 (GOWN DISPOSABLE) ×1 IMPLANT
GOWN STRL REUS W/ TWL XL LVL3 (GOWN DISPOSABLE) ×1 IMPLANT
GOWN STRL REUS W/TWL LRG LVL3 (GOWN DISPOSABLE) ×1
GOWN STRL REUS W/TWL XL LVL3 (GOWN DISPOSABLE) ×1
HOLDER FOLEY CATH W/STRAP (MISCELLANEOUS) ×1 IMPLANT
IV NS IRRIG 3000ML ARTHROMATIC (IV SOLUTION) ×5 IMPLANT
KIT TURNOVER CYSTO (KITS) ×1 IMPLANT
MBRN O SEALING YLW 17 FOR INST (MISCELLANEOUS) ×1
MEMBRANE SLNG YLW 17 FOR INST (MISCELLANEOUS) ×1 IMPLANT
MORCELLATOR COLLECT CONTAINER (MISCELLANEOUS) ×1
MORCELLATOR OVERFLOW FILTER (FILTER) ×1
MORCELLATOR ROTATION 4.75 335 (MISCELLANEOUS) ×1 IMPLANT
PACK CYSTO AR (MISCELLANEOUS) ×1 IMPLANT
SET CYSTO W/LG BORE CLAMP LF (SET/KITS/TRAYS/PACK) ×1 IMPLANT
SET IRRIG Y TYPE TUR BLADDER L (SET/KITS/TRAYS/PACK) ×1 IMPLANT
SLEEVE PROTECTION STRL DISP (MISCELLANEOUS) ×2 IMPLANT
SURGILUBE 2OZ TUBE FLIPTOP (MISCELLANEOUS) ×1 IMPLANT
SYR TOOMEY IRRIG 70ML (MISCELLANEOUS) ×1
SYRINGE TOOMEY IRRIG 70ML (MISCELLANEOUS) ×1 IMPLANT
TUBE PUMP MORCELLATOR PIRANHA (TUBING) ×1 IMPLANT
WATER STERILE IRR 1000ML POUR (IV SOLUTION) ×1 IMPLANT

## 2023-09-09 NOTE — Op Note (Signed)
Date of procedure: 09/09/23  Preoperative diagnosis:  BPH with obstruction  Postoperative diagnosis:  Same  Procedure: HoLEP (Holmium Laser Enucleation of the Prostate)  Surgeon: [name], MD  Anesthesia: General  Complications: None  Intraoperative findings:  Massive median lobe, moderate bladder trabeculations, no suspicious lesions Median lobe enucleated, open prostatic fossa, ureteral orifices and verumontanum intact at conclusion of case   EBL: 25 mL  Specimens: Prostate chips  Enucleation time: 18 minutes  Morcellation time: 10 minutes  Intra-op weight: 45g  Drains: 24 French three-way, 60 cc in balloon  Indication: Matthew Jordan is a 72 y.o. patient with BPH and recurrent urinary retention who opted for HOLEP.  He has had numerous concerns about potential risk of incontinence as well as retrograde ejaculation.  We discussed these at length in the preop area, and we also discussed alternative options like prostate artery embolization.  Ultimately, using shared decision making he opted for HOLEP, with plan to enucleate just the large median lobe to reduce risk of incontinence and retrograde ejaculation.  After reviewing the management options for treatment, they elected to proceed with the above surgical procedure(s). We have discussed the potential benefits and risks of the procedure, side effects of the proposed treatment, the likelihood of the patient achieving the goals of the procedure, and any potential problems that might occur during the procedure or recuperation.  We specifically discussed the risks of bleeding, infection, hematuria and clot retention, need for additional procedures, possible overnight hospital stay, temporary urgency and incontinence, rare long-term incontinence, and retrograde ejaculation.  Informed consent has been obtained.   Description of procedure:  The patient was taken to the operating room and general anesthesia was induced.   The patient was placed in the dorsal lithotomy position, prepped and draped in the usual sterile fashion, and preoperative antibiotics(gentamicin, Cipro) were administered.  SCDs were placed for DVT prophylaxis.  A preoperative time-out was performed.   Sissy Hoff sounds were used to gently dilated the urethra up to 76F. The 66 French continuous flow resectoscope was inserted into the urethra using the visual obturator  The prostate was large with a high bladder neck and massive median lobe. The bladder was thoroughly inspected and notable for moderate trabeculations but no suspicious lesions.  The ureteral orifices were located in orthotopic position.    The laser was set to 2 J and 60 Hz and a 5 and 7 o' clock incisions were made on either side of the median lobe from the bladder neck down to the verumontanum, A lambda incision was then made proximal to the verumontanum.  The median lobe of the prostate was enucleated into the bladder.  The prostatic fossa was open after median lobe enucleation.  The bladder neck was examined and laser was used for meticulous hemostasis.    The 7 French resectoscope was then switched out for the 26 French nephroscope and prostate tissue was morcellated(Piranha) and the tissue sent to pathology.  A 24 French three-way catheter was inserted easily with the aid of a catheter guide, and 60 cc were placed in the balloon.  Urine was pink.  The catheter irrigated easily with a Toomey syringe.  CBI was initiated.   The patient tolerated the procedure well without any immediate complications and was extubated and transferred to the recovery room in stable condition.  Urine was clear on fast CBI.  Disposition: Stable to PACU  Plan: Wean CBI in PACU, anticipate discharge home today with Foley removal in clinic in  2-3 days  [name], MD 09/09/2023

## 2023-09-09 NOTE — Progress Notes (Signed)
Called to assess urine in postop area-urine was strawberry after completing CBI.  I irrigated the catheter with of saline, no clots, urine cleared quickly to medium pink and translucent.  Return precautions discussed with family  [name], MD 09/09/2023

## 2023-09-09 NOTE — Transfer of Care (Signed)
Immediate Anesthesia Transfer of Care Note  Patient: Matthew Jordan  Procedure(s) Performed: HOLEP-LASER ENUCLEATION OF THE PROSTATE WITH MORCELLATION  Patient Location: PACU  Anesthesia Type:General  Level of Consciousness: awake, drowsy, and patient cooperative  Airway & Oxygen Therapy: Patient Spontanous Breathing and Patient connected to face mask oxygen  Post-op Assessment: Report given to RN, Post -op Vital signs reviewed and stable, and Patient moving all extremities X 4  Post vital signs: Reviewed and stable  Last Vitals:  Vitals Value Taken Time  BP 142/99 09/09/23 1011  Temp    Pulse 77 09/09/23 1014  Resp 15 09/09/23 1014  SpO2 94 % 09/09/23 1014  Vitals shown include unfiled device data.  Last Pain:  Vitals:   09/09/23 0745  TempSrc: Temporal  PainSc: 0-No pain         Complications: No notable events documented.

## 2023-09-09 NOTE — Anesthesia Procedure Notes (Signed)
Procedure Name: Intubation Date/Time: 09/09/2023 9:00 AM  Performed by: Lanell Matar, CRNAPre-anesthesia Checklist: Patient identified, Emergency Drugs available, Suction available and Patient being monitored Patient Re-evaluated:Patient Re-evaluated prior to induction Oxygen Delivery Method: Circle System Utilized Preoxygenation: Pre-oxygenation with 100% oxygen Induction Type: IV induction Ventilation: Mask ventilation without difficulty Laryngoscope Size: McGraph and 4 Grade View: Grade I Tube type: Oral Tube size: 7.5 mm Number of attempts: 1 Airway Equipment and Method: Stylet and Oral airway Placement Confirmation: ETT inserted through vocal cords under direct vision, positive ETCO2 and breath sounds checked- equal and bilateral Secured at: 22 cm Tube secured with: Tape Dental Injury: Teeth and Oropharynx as per pre-operative assessment

## 2023-09-09 NOTE — Anesthesia Preprocedure Evaluation (Addendum)
Anesthesia Evaluation  Patient identified by MRN, date of birth, ID band Patient awake    Reviewed: Allergy & Precautions, NPO status , Patient's Chart, lab work & pertinent test results  History of Anesthesia Complications Negative for: history of anesthetic complications  Airway Mallampati: III  TM Distance: >3 FB Neck ROM: full    Dental  (+) Poor Dentition   Pulmonary sleep apnea , former smoker   Pulmonary exam normal        Cardiovascular hypertension, On Medications + CAD and +CHF  + dysrhythmias Atrial Fibrillation   Myocardial perfusion imaging study was performed on 09/16/2021 revealing a low normal left ventricular systolic function with an EF of 51%.  There was no evidence of stress-induced myocardial ischemia or arrhythmia; no scintigraphic evidence of scar.  Study determined to be normal and low risk.  Per cardiology, "given past medical history and time since last visit, based on ACC/AHA guidelines, LOISE DOYAL would be at acceptable risk for the planned procedure without further cardiovascular testing.  His RCRI is moderate risk, 6.6% risk of major cardiac event.  He is able to complete greater than 4 METS of physical activity."   Neuro/Psych negative neurological ROS  negative psych ROS   GI/Hepatic Neg liver ROS,GERD  Controlled,,  Endo/Other  negative endocrine ROS    Renal/GU negative Renal ROS     Musculoskeletal  (+) Arthritis ,    Abdominal   Peds  Hematology negative hematology ROS (+)   Anesthesia Other Findings Past Medical History: No date: (HFpEF) heart failure with preserved ejection fraction (HCC)     Comment:  a.) stress TTE 04/26/2019: EF >55%, mod LVH, triv               MR/TR/PR, PASP 26.4; b.) TTE 09/25/2020: EF >75%, no               RWMAs, G2DD No date: Aortic atherosclerosis (HCC) No date: Arthritis No date: BPH with urinary obstruction 2014: CAD (coronary artery  disease)     Comment:  a.) LHC in 2014 while in [place]: 40% LAD - med mgmt;               b.) MV 09/16/2021: no ischemia No date: Chest pain No date: Cholelithiasis No date: Dysphagia No date: Elevated PSA No date: Erectile dysfunction     Comment:  a.) on PDE5i (tadalafil) No date: GERD (gastroesophageal reflux disease) No date: Hepatic cyst No date: HLD (hyperlipidemia) No date: Hypertension No date: On apixaban therapy No date: OSA (obstructive sleep apnea)     Comment:  a.) does not utilize noctural PAP therapy No date: PAF (paroxysmal atrial fibrillation) (HCC)     Comment:  a.) CHA2DS2-VASc = 4 (age, HFpEF, HTN, vascular disease               history) as of 09/06/2023; b.) cardiac rate/rhythm               maintained on oral diltiazem; chronically anticoagulated               using apixaban No date: SOB (shortness of breath) 09/2020: Tortuosity of aortic arch     Comment:  a.) concern noted on diagnostic radiographs of chest (?               aneurysmal)  --> further assessed by CTA and noted to be               tortuosity of the aorta  rather than aneurysm  Past Surgical History: 11/15/2012: CORONARY ANGIOPLASTY WITH STENT PLACEMENT     Comment:  Location: [place] 01/2023: EYE SURGERY; Right No date: HERNIA REPAIR     Reproductive/Obstetrics negative OB ROS                             Anesthesia Physical Anesthesia Plan  ASA: 3  Anesthesia Plan: General ETT   Post-op Pain Management: Ofirmev IV (intra-op)* and Toradol IV (intra-op)*   Induction: Intravenous  PONV Risk Score and Plan: 3 and Ondansetron, Dexamethasone and Treatment may vary due to age or medical condition  Airway Management Planned: Oral ETT  Additional Equipment:   Intra-op Plan:   Post-operative Plan: Extubation in OR  Informed Consent: I have reviewed the patients History and Physical, chart, labs and discussed the procedure including the risks, benefits and  alternatives for the proposed anesthesia with the patient or authorized representative who has indicated his/her understanding and acceptance.     Dental Advisory Given  Plan Discussed with: Anesthesiologist, CRNA and Surgeon  Anesthesia Plan Comments: (Patient consented for risks of anesthesia including but not limited to:  - adverse reactions to medications - damage to eyes, teeth, lips or other oral mucosa - nerve damage due to positioning  - sore throat or hoarseness - Damage to heart, brain, nerves, lungs, other parts of body or loss of life  Patient voiced understanding and assent.)        Anesthesia Quick Evaluation

## 2023-09-09 NOTE — Anesthesia Postprocedure Evaluation (Signed)
Anesthesia Post Note  Patient: Matthew Jordan  Procedure(s) Performed: HOLEP-LASER ENUCLEATION OF THE PROSTATE WITH MORCELLATION  Patient location during evaluation: PACU Anesthesia Type: General Level of consciousness: awake and alert Pain management: pain level controlled Vital Signs Assessment: post-procedure vital signs reviewed and stable Respiratory status: spontaneous breathing, nonlabored ventilation, respiratory function stable and patient connected to nasal cannula oxygen Cardiovascular status: blood pressure returned to baseline and stable Postop Assessment: no apparent nausea or vomiting Anesthetic complications: no   No notable events documented.   Last Vitals:  Vitals:   09/09/23 1013 09/09/23 1015  BP: (!) 142/99 136/86  Pulse: 78 73  Resp: 17 (!) 21  Temp: (!) 36.1 C   SpO2: 96% 93%    Last Pain:  Vitals:   09/09/23 1015  TempSrc:   PainSc: 0-No pain                 Louie Boston

## 2023-09-09 NOTE — Discharge Instructions (Signed)

## 2023-09-09 NOTE — H&P (Signed)
09/09/23 8:34 AM   Matthew Jordan 19-Jul-1951 [id number]  CC: BPH, urinary retention, recurrent gross hematuria  HPI: 72 year old male who I originally met in March 2024 for long history of BPH, recurrent retention and mildly elevated PSA that has been chronically elevated ranging from 7-8 over the last 10 years with a negative prostate biopsy previously in [place] that reportedly showed a significantly enlarged prostate.  He is on maximal medical therapy with Flomax and finasteride, he has developed urinary retention requiring Foley catheter at least 6 times, including a suprapubic tube at some point in [place].   In June 2024 he underwent a cystoscopy showing massive prostate with high bladder neck and large median lobe, moderate to severe trabeculations, no suspicious lesions, TRUS showed a prostate volume of 213 g.  Foley catheter was replaced at that visit.   He has previously been scheduled for HOLEP and no showed those visits, ultimately resulting in numerous ER visits for catheter replacement, or catheter leakage/gross hematuria.  He had concerns about HOLEP today, and we had another very long conversation about alternatives like prostatic artery embolization or chronic Foley or chronic suprapubic tube.  His primary concerns are retrograde ejaculation and incontinence.   PMH: Past Medical History:  Diagnosis Date   (HFpEF) heart failure with preserved ejection fraction (HCC)    a.) stress TTE 04/26/2019: EF >55%, mod LVH, triv MR/TR/PR, PASP 26.4; b.) TTE 09/25/2020: EF >75%, no RWMAs, G2DD   Aortic atherosclerosis (HCC)    Arthritis    BPH with urinary obstruction    CAD (coronary artery disease) 2014   a.) LHC in 2014 while in [place]: 40% LAD - med mgmt; b.) MV 09/16/2021: no ischemia   Chest pain    Cholelithiasis    Dysphagia    Elevated PSA    Erectile dysfunction    a.) on PDE5i (tadalafil)   GERD (gastroesophageal reflux disease)    Hepatic cyst    HLD  (hyperlipidemia)    Hypertension    On apixaban therapy    OSA (obstructive sleep apnea)    a.) does not utilize noctural PAP therapy   PAF (paroxysmal atrial fibrillation) (HCC)    a.) CHA2DS2-VASc = 4 (age, HFpEF, HTN, vascular disease history) as of 09/06/2023; b.) cardiac rate/rhythm maintained on oral diltiazem; chronically anticoagulated using apixaban   SOB (shortness of breath)    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 aneurysmal)  --> further assessed by CTA and noted to be tortuosity of the aorta rather than aneurysm    Surgical History: Past Surgical History:  Procedure Laterality Date   CORONARY ANGIOPLASTY WITH STENT PLACEMENT  11/15/2012   Location: [place]   EYE SURGERY Right 01/2023   HERNIA REPAIR     Family History: Family History  Problem Relation Age of Onset   Hypertension Mother    Lung disease Mother    Hypertension Father    Cancer Father        possible prostate cancer   Kidney disease Neg Hx     Social History:  reports that he quit smoking about 42 years ago. His smoking use included cigarettes. He started smoking about 52 years ago. He has a 2.5 pack-year smoking history. He has been exposed to tobacco smoke. He has never used smokeless tobacco. He reports current alcohol use of about 2.0 standard drinks of alcohol per week. He reports that he does not use drugs.  Physical Exam:  BP (!) 137/92   Pulse 64   Temp 98 F (36.7 C) (Temporal)   Resp 16   SpO2 94%    Constitutional:  Alert and oriented, No acute distress. Cardiovascular: RRR Respiratory: cta B/L GI: Abdomen is soft, nontender, nondistended, no abdominal masses  Laboratory Data: Culture 08/31/2023 with Enterobacter, treated with antibiotics  Assessment & Plan:   72 year old male with 213 g prostate, massive median lobe and high bladder neck, recurrent urinary retention requiring Foley catheter at least 6 times as well as  suprapubic tube in [place].  He also has a history of a mildly elevated PSA with low PSA density, negative prostate biopsy in [place].  He has a number of anxiety and concerns about HOLEP surgery.  We again discussed alternatives this morning including chronic Foley with monthly changes, suprapubic tube, or prostatic artery embolization.  Risk and benefits were discussed extensively.  Ultimately, using shared decision making we opted for HOLEP, and if feasible we will perform middle lobe enucleation only-this would provide a much lower risk of incontinence and retrograde ejaculation, though the risks would be recurrent retention or gross hematuria.  He understands these risks and prefers middle lobe enucleation if feasible intraoperatively.  We discussed the risks and benefits of HoLEP at length.  The procedure requires general anesthesia and takes 1 to 2 hours, and a holmium laser is used to enucleate the prostate and push this tissue into the bladder.  A morcellator is then used to remove this tissue, which is sent for pathology.  The vast majority(>95%) of patients are able to discharge the same day with a catheter in place for 2 to 3 days, and will follow-up in clinic for a voiding trial.  We specifically discussed the risks of bleeding, infection, retrograde ejaculation, temporary urgency and urge incontinence, very low risk of long-term incontinence, urethral stricture/bladder neck contracture, pathologic evaluation of prostate tissue and possible detection of prostate cancer or other malignancy, and possible need for additional procedures.  HOLEP today, will consider middle lobe enucleation only pending intraoperative findings per patient request  [name], MD 09/09/2023  [facility] [address] [place], [place] [address] [phone]

## 2023-09-09 NOTE — Progress Notes (Signed)
Urine still bloody. Paged Dr Richardo Hanks to evaluate. Irrigated bladder multiple times. Urine cleared to light red. Instructions given to daughter on home irrigation. Okay to d/c home.

## 2023-09-10 ENCOUNTER — Encounter: Payer: Self-pay | Admitting: Urology

## 2023-09-12 ENCOUNTER — Ambulatory Visit (INDEPENDENT_AMBULATORY_CARE_PROVIDER_SITE_OTHER): Payer: Medicare HMO | Admitting: Urology

## 2023-09-12 ENCOUNTER — Encounter: Payer: Self-pay | Admitting: Urology

## 2023-09-12 VITALS — BP 122/77 | HR 59 | Ht 70.0 in | Wt 230.2 lb

## 2023-09-12 DIAGNOSIS — N401 Enlarged prostate with lower urinary tract symptoms: Secondary | ICD-10-CM

## 2023-09-12 DIAGNOSIS — N138 Other obstructive and reflux uropathy: Secondary | ICD-10-CM

## 2023-09-12 LAB — SURGICAL PATHOLOGY

## 2023-09-12 NOTE — Patient Instructions (Signed)

## 2023-09-14 ENCOUNTER — Ambulatory Visit: Payer: Medicare HMO | Admitting: Physician Assistant

## 2023-09-15 ENCOUNTER — Ambulatory Visit (INDEPENDENT_AMBULATORY_CARE_PROVIDER_SITE_OTHER): Payer: Medicare HMO | Admitting: Physician Assistant

## 2023-09-15 ENCOUNTER — Encounter: Payer: Self-pay | Admitting: Physician Assistant

## 2023-09-15 VITALS — BP 97/67 | HR 96 | Ht 70.0 in | Wt 221.0 lb

## 2023-09-15 DIAGNOSIS — R82998 Other abnormal findings in urine: Secondary | ICD-10-CM

## 2023-09-15 DIAGNOSIS — N39498 Other specified urinary incontinence: Secondary | ICD-10-CM

## 2023-09-15 DIAGNOSIS — R300 Dysuria: Secondary | ICD-10-CM | POA: Diagnosis not present

## 2023-09-19 ENCOUNTER — Telehealth: Payer: Self-pay

## 2023-09-19 LAB — COMPREHENSIVE METABOLIC PANEL
ALT: 19 IU/L (ref 0–44)
AST: 15 IU/L (ref 0–40)
Albumin: 4.7 g/dL (ref 3.8–4.8)
Alkaline Phosphatase: 90 IU/L (ref 44–121)
BUN/Creatinine Ratio: 13 (ref 10–24)
BUN: 16 mg/dL (ref 8–27)
Bilirubin Total: 0.5 mg/dL (ref 0.0–1.2)
CO2: 22 mmol/L (ref 20–29)
Calcium: 10.3 mg/dL — ABNORMAL HIGH (ref 8.6–10.2)
Chloride: 102 mmol/L (ref 96–106)
Creatinine, Ser: 1.24 mg/dL (ref 0.76–1.27)
Globulin, Total: 3.1 g/dL (ref 1.5–4.5)
Glucose: 108 mg/dL — ABNORMAL HIGH (ref 70–99)
Potassium: 4.6 mmol/L (ref 3.5–5.2)
Sodium: 141 mmol/L (ref 134–144)
Total Protein: 7.8 g/dL (ref 6.0–8.5)
eGFR: 61 mL/min/{1.73_m2} (ref >59)

## 2023-09-19 LAB — URINALYSIS, ROUTINE W REFLEX MICROSCOPIC
Glucose, UA: NEGATIVE
Ketones, UA: NEGATIVE
Nitrite, UA: POSITIVE — AB
Specific Gravity, UA: 1.028 (ref 1.005–1.030)
Urobilinogen, Ur: 1 mg/dL (ref 0.2–1.0)
pH, UA: 5.5 (ref 5.0–7.5)

## 2023-09-19 LAB — CBC WITH DIFFERENTIAL/PLATELET
Basophils Absolute: 0.1 10*3/uL (ref 0.0–0.2)
Basos: 1 %
EOS (ABSOLUTE): 0.1 10*3/uL (ref 0.0–0.4)
Eos: 1 %
Hematocrit: 48.3 % (ref 37.5–51.0)
Hemoglobin: 16.4 g/dL (ref 13.0–17.7)
Immature Grans (Abs): 0.2 10*3/uL — ABNORMAL HIGH (ref 0.0–0.1)
Immature Granulocytes: 2 %
Lymphocytes Absolute: 1.5 10*3/uL (ref 0.7–3.1)
Lymphs: 15 %
MCH: 31.2 pg (ref 26.6–33.0)
MCHC: 34 g/dL (ref 31.5–35.7)
MCV: 92 fL (ref 79–97)
Monocytes Absolute: 0.9 10*3/uL (ref 0.1–0.9)
Monocytes: 9 %
Neutrophils Absolute: 7.3 10*3/uL — ABNORMAL HIGH (ref 1.4–7.0)
Neutrophils: 72 %
Platelets: 309 10*3/uL (ref 150–450)
RBC: 5.25 x10E6/uL (ref 4.14–5.80)
RDW: 13 % (ref 11.6–15.4)
WBC: 10.1 10*3/uL (ref 3.4–10.8)

## 2023-09-19 LAB — MICROSCOPIC EXAMINATION
Bacteria, UA: NONE SEEN
Casts: NONE SEEN /LPF
RBC, Urine: >30 /HPF — AB (ref 0–2)
WBC, UA: >30 /HPF — AB (ref 0–5)

## 2023-09-19 LAB — URINE CULTURE

## 2023-09-19 NOTE — Telephone Encounter (Signed)
2nd Request   Unsigned Note Copied from CRM [id number]. Topic: General - Inquiry >> Sep 19, 2023  9:36 AM Haroldine Laws wrote: Reason for CRM: pt daughter called asking a bout a medication antibiotic that should be sent to [facility] for pt's infection.   CB@  [phone]

## 2023-09-19 NOTE — Telephone Encounter (Unsigned)
Copied from CRM [id number]. Topic: General - Inquiry >> Sep 19, 2023  9:36 AM Haroldine Laws wrote: Reason for CRM: pt daughter called asking a bout a medication antibiotic that should be sent to [facility] for pt's infection.  CB@  [phone]

## 2023-09-20 NOTE — Progress Notes (Unsigned)
09/21/2023 9:01 AM   Matthew Jordan 05-08-1951 [id number]  Referring provider: Debera Lat, PA-C [address] [place],  [place] [address]  Urological history: 1. BPH with LU TS  -s/p HoLEP (08/2023)  -Prostate chips negative for malignancy   No chief complaint on file.  HPI: Matthew Jordan is a 72 y.o. male who presents today for perineal pain, dysuria and gross hematuria.   Previous records reviewed.   He states he has been having pain in his colon area for the last year.  He states he did see his primary care provider concerning this and they are going to get him scheduled for a colonoscopy.  I explained that the pain in his colon is not due to the HoLEP and encouraged him to keep his appointments for colonoscopy.  He is currently drinking a gallon of water a day, he is having blood in the urine, urgency, urge incontinence and dysuria.  Patient denies any modifying or aggravating factors.  Patient denies any recent UTI's or suprapubic/flank pain.  Patient denies any fevers, chills, nausea or vomiting.    UA Dark orange, mild pyuria, hematuria and a few bacteria  PVR 0 mL   PMH: Past Medical History:  Diagnosis Date   (HFpEF) heart failure with preserved ejection fraction (HCC)    a.) stress TTE 04/26/2019: EF >55%, mod LVH, triv MR/TR/PR, PASP 26.4; b.) TTE 09/25/2020: EF >75%, no RWMAs, G2DD   Aortic atherosclerosis (HCC)    Arthritis    BPH with urinary obstruction    CAD (coronary artery disease) 2014   a.) LHC in 2014 while in [place]: 40% LAD - med mgmt; b.) MV 09/16/2021: no ischemia   Chest pain    Cholelithiasis    Dysphagia    Elevated PSA    Erectile dysfunction    a.) on PDE5i (tadalafil)   GERD (gastroesophageal reflux disease)    Hepatic cyst    HLD (hyperlipidemia)    Hypertension    On apixaban therapy    OSA (obstructive sleep apnea)    a.) does not utilize noctural PAP therapy   PAF (paroxysmal atrial fibrillation) (HCC)     a.) CHA2DS2-VASc = 4 (age, HFpEF, HTN, vascular disease history) as of 09/06/2023; b.) cardiac rate/rhythm maintained on oral diltiazem; chronically anticoagulated using apixaban   SOB (shortness of breath)    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 aneurysmal)  --> further assessed by CTA and noted to be tortuosity of the aorta rather than aneurysm    Surgical History: Past Surgical History:  Procedure Laterality Date   CORONARY ANGIOPLASTY WITH STENT PLACEMENT  11/15/2012   Location: [place]   EYE SURGERY Right 01/2023   HERNIA REPAIR     HOLEP-LASER ENUCLEATION OF THE PROSTATE WITH MORCELLATION N/A 09/09/2023   Procedure: HOLEP-LASER ENUCLEATION OF THE PROSTATE WITH MORCELLATION;  Surgeon: Sondra Come, MD;  Location: [facility];  Service: Urology;  Laterality: N/A;    Home Medications:  Allergies as of 09/21/2023       Reactions   Amoxicillin Other (See Comments)        Medication List        Accurate as of September 21, 2023  9:01 AM. If you have any questions, ask your nurse or doctor.          clotrimazole 1 % cream Commonly known as: Clotrimazole Anti-Fungal Apply 1 Application topically 2 (two) times daily.  clotrimazole-betamethasone cream Commonly known as: Lotrisone Apply 1 Application topically 2 (two) times daily.   diltiazem 240 MG 24 hr capsule Commonly known as: CARDIZEM CD Take 1 capsule (240 mg total) by mouth daily.   furosemide 40 MG tablet Commonly known as: LASIX TAKE 1 TABLET BY MOUTH DAILY AS NEEDED   lisinopril-hydrochlorothiazide 10-12.5 MG tablet Commonly known as: ZESTORETIC Take 1 tablet by mouth daily.   nystatin powder Commonly known as: MYCOSTATIN/NYSTOP Apply 1 Application topically 3 (three) times daily.   potassium chloride SA 20 MEQ tablet Commonly known as: KLOR-CON M Take 2 pills (40 mEq) today, then 1 pill (20 mEq) in the morning before surgery. Follow up with PCP  for repeat labs.   Rocklatan 0.02-0.005 % Soln Generic drug: Netarsudil-Latanoprost Apply 1 drop to eye at bedtime.   tadalafil 5 MG tablet Commonly known as: CIALIS Take 1-2 tablets (5-10 mg total) by mouth daily as needed for erectile dysfunction (take 1 hour prior to sexual activity).   terbinafine 250 MG tablet Commonly known as: LamISIL Take 1 tablet (250 mg total) by mouth daily.   traMADol 50 MG tablet Commonly known as: Ultram Take 0.5-1 tablets (25-50 mg total) by mouth every 12 (twelve) hours as needed for moderate pain.        Allergies:  Allergies  Allergen Reactions   Amoxicillin Other (See Comments)    Family History: Family History  Problem Relation Age of Onset   Hypertension Mother    Lung disease Mother    Hypertension Father    Cancer Father        possible prostate cancer   Kidney disease Neg Hx     Social History:  reports that he quit smoking about 42 years ago. His smoking use included cigarettes. He started smoking about 53 years ago. He has a 2.5 pack-year smoking history. He has been exposed to tobacco smoke. He has never used smokeless tobacco. He reports current alcohol use of about 2.0 standard drinks of alcohol per week. He reports that he does not use drugs.  ROS: Pertinent ROS in HPI  Physical Exam: BP 139/89   Pulse 81   Temp 97.8 F (36.6 C)   Ht 5\' 10"  (1.778 m)   Wt 221 lb (100.2 kg)   BMI 31.71 kg/m   Constitutional:  Well nourished. Alert and oriented, No acute distress. HEENT: [place] AT, moist mucus membranes.  Trachea midline Cardiovascular: No clubbing, cyanosis, or edema. Respiratory: Normal respiratory effort, no increased work of breathing. Neurologic: Grossly intact, no focal deficits, moving all 4 extremities. Psychiatric: Normal mood and affect.  Laboratory Data: Lab Results  Component Value Date   WBC 10.1 09/15/2023   HGB 16.4 09/15/2023   HCT 48.3 09/15/2023   MCV 92 09/15/2023   PLT 309 09/15/2023     Lab Results  Component Value Date   CREATININE 1.24 09/15/2023    Lab Results  Component Value Date   HGBA1C 5.9 (H) 03/17/2023    Lab Results  Component Value Date   TSH 2.660 03/17/2023       Component Value Date/Time   CHOL 171 03/17/2023 0838   HDL 33 (L) 03/17/2023 0838   CHOLHDL 5.2 (H) 03/17/2023 0838   LDLCALC 112 (H) 03/17/2023 0838    Lab Results  Component Value Date   AST 15 09/15/2023   Lab Results  Component Value Date   ALT 19 09/15/2023    Urinalysis See EPIC and HPI I have reviewed the labs.  Pertinent Imaging:  09/21/23 08:43  Scan Result 0 mL    Assessment & Plan:    1. BPH with LU TS -s/p HoLEP -PVR demonstrates adequate emptying -Reassured the patient's that the blood that he is seen in the urine, the urgency and the burning with urination are to be expected after HoLEP procedure and I recommended to decrease his water intake  2. Perineal pain -Encouraged him to keep his appointment for colonoscopy and explained that the colon pain is not due to his prostate surgery  3. Gross hematuria -explained that blood in the urine is common after HoLEP -UA with heme -urine culture pending   Return for keep scheduled appointments .  These notes generated with voice recognition software. I apologize for typographical errors.  Cloretta Ned  [facility] [address]  [address] [place], [place] [address] [phone]

## 2023-09-21 ENCOUNTER — Encounter: Payer: Self-pay | Admitting: Urology

## 2023-09-21 ENCOUNTER — Ambulatory Visit: Payer: Medicare HMO | Admitting: Urology

## 2023-09-21 VITALS — BP 139/89 | HR 81 | Temp 97.8°F | Ht 70.0 in | Wt 221.0 lb

## 2023-09-21 DIAGNOSIS — Z09 Encounter for follow-up examination after completed treatment for conditions other than malignant neoplasm: Secondary | ICD-10-CM | POA: Diagnosis not present

## 2023-09-21 DIAGNOSIS — R102 Pelvic and perineal pain: Secondary | ICD-10-CM

## 2023-09-21 DIAGNOSIS — N138 Other obstructive and reflux uropathy: Secondary | ICD-10-CM

## 2023-09-21 DIAGNOSIS — R310 Gross hematuria: Secondary | ICD-10-CM

## 2023-09-21 DIAGNOSIS — Z87438 Personal history of other diseases of male genital organs: Secondary | ICD-10-CM | POA: Diagnosis not present

## 2023-09-21 LAB — URINALYSIS, COMPLETE
Bilirubin, UA: NEGATIVE
Glucose, UA: NEGATIVE
Nitrite, UA: NEGATIVE
Specific Gravity, UA: >1.03 — ABNORMAL HIGH (ref 1.005–1.030)
Urobilinogen, Ur: 1 mg/dL (ref 0.2–1.0)
pH, UA: 6 (ref 5.0–7.5)

## 2023-09-21 LAB — BLADDER SCAN AMB NON-IMAGING
PVR: 0 WU
Scan Result: 0

## 2023-09-21 LAB — MICROSCOPIC EXAMINATION: RBC, Urine: >30 /[HPF] — AB (ref 0–2)

## 2023-09-24 LAB — CULTURE, URINE COMPREHENSIVE

## 2023-09-29 ENCOUNTER — Ambulatory Visit: Payer: Medicare HMO

## 2023-09-29 ENCOUNTER — Encounter: Payer: Self-pay | Admitting: Cardiology

## 2023-09-29 ENCOUNTER — Ambulatory Visit: Payer: Medicare HMO | Attending: Cardiology | Admitting: Cardiology

## 2023-09-29 VITALS — BP 132/92 | HR 64 | Ht 70.0 in | Wt 230.0 lb

## 2023-09-29 DIAGNOSIS — I10 Essential (primary) hypertension: Secondary | ICD-10-CM | POA: Diagnosis not present

## 2023-09-29 DIAGNOSIS — I480 Paroxysmal atrial fibrillation: Secondary | ICD-10-CM

## 2023-09-29 DIAGNOSIS — I2510 Atherosclerotic heart disease of native coronary artery without angina pectoris: Secondary | ICD-10-CM

## 2023-09-29 MED ORDER — APIXABAN 5 MG PO TABS: 5.0000 mg | ORAL_TABLET | Freq: Two times a day (BID) | ORAL | 3 refills | Status: DC

## 2023-09-29 NOTE — Progress Notes (Signed)
Cardiology Office Note:    Date:  09/29/2023   ID:  [name], DOB Jun 30, 1951, MRN [id number]  PCP:  Debera Lat, PA-C   [facility] Providers Cardiologist:  Debbe Odea, MD     Referring MD: Debera Lat, PA-C   Chief Complaint  Patient presents with   Follow-up    Patient denies new or acute cardiac problems/concerns today.  Reports stopped the Lisinopril due to low blood pressure.    History of Present Illness:    Matthew Jordan is a 72 y.o. male with a hx of CAD (LAD and RCA calcifications on chest CT ), HFpEF, hypertension, paroxysmal atrial fibrillation who presents for follow-up.  Has occasional palpitations occurring randomly usually in his sleep.  Denies dizziness, presyncope or syncope.  Had prostate surgery 3 weeks ago, Eliquis was held, has not restarted Eliquis since.  Compliant with Cardizem 240 mg daily.  Has been exercising more, lost about 20 pounds.  Blood pressures became really low, stopped lisinopril.  Prior notes Echo 09/2020 EF 75%, grade 1 diastolic dysfunction Myoview 11/22 no ischemia.  Past Medical History:  Diagnosis Date   (HFpEF) heart failure with preserved ejection fraction (HCC)    a.) stress TTE 04/26/2019: EF >55%, mod LVH, triv MR/TR/PR, PASP 26.4; b.) TTE 09/25/2020: EF >75%, no RWMAs, G2DD   Aortic atherosclerosis (HCC)    Arthritis    BPH with urinary obstruction    CAD (coronary artery disease) 2014   a.) LHC in 2014 while in [place]: 40% LAD - med mgmt; b.) MV 09/16/2021: no ischemia   Chest pain    Cholelithiasis    Dysphagia    Elevated PSA    Erectile dysfunction    a.) on PDE5i (tadalafil)   GERD (gastroesophageal reflux disease)    Hepatic cyst    HLD (hyperlipidemia)    Hypertension    On apixaban therapy    OSA (obstructive sleep apnea)    a.) does not utilize noctural PAP therapy   PAF (paroxysmal atrial fibrillation) (HCC)    a.) CHA2DS2-VASc = 4 (age, HFpEF, HTN, vascular disease history)  as of 09/06/2023; b.) cardiac rate/rhythm maintained on oral diltiazem; chronically anticoagulated using apixaban   SOB (shortness of breath)    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 aneurysmal)  --> further assessed by CTA and noted to be tortuosity of the aorta rather than aneurysm    Past Surgical History:  Procedure Laterality Date   CORONARY ANGIOPLASTY WITH STENT PLACEMENT  11/15/2012   Location: [place]   EYE SURGERY Right 01/2023   HERNIA REPAIR     HOLEP-LASER ENUCLEATION OF THE PROSTATE WITH MORCELLATION N/A 09/09/2023   Procedure: HOLEP-LASER ENUCLEATION OF THE PROSTATE WITH MORCELLATION;  Surgeon: Matthew Come, MD;  Location: [facility];  Service: Urology;  Laterality: N/A;    Current Medications: Current Meds  Medication Sig   apixaban (ELIQUIS) 5 MG TABS tablet Take 1 tablet (5 mg total) by mouth 2 (two) times daily.   diltiazem (CARDIZEM CD) 240 MG 24 hr capsule Take 1 capsule (240 mg total) by mouth daily.   nystatin (MYCOSTATIN/NYSTOP) powder Apply 1 Application topically 3 (three) times daily.   ROCKLATAN 0.02-0.005 % SOLN Apply 1 drop to eye at bedtime.   tadalafil (CIALIS) 5 MG tablet Take 1-2 tablets (5-10 mg total) by mouth daily as needed for erectile dysfunction (take 1 hour prior to sexual activity).     Allergies:  Amoxicillin   Social History   Socioeconomic History   Marital status: Divorced    Spouse name: Not on file   Number of children: Not on file   Years of education: Not on file   Highest education level: Not on file  Occupational History   Not on file  Tobacco Use   Smoking status: Former    Current packs/day: 0.00    Average packs/day: 0.3 packs/day for 10.0 years (2.5 ttl pk-yrs)    Types: Cigarettes    Start date: 10/02/1970    Quit date: 10/02/1980    Years since quitting: 43.0    Passive exposure: Past   Smokeless tobacco: Never  Vaping Use   Vaping status: Never Used   Substance and Sexual Activity   Alcohol use: Yes    Alcohol/week: 2.0 standard drinks of alcohol    Types: 2 Cans of beer per week    Comment: occa   Drug use: No   Sexual activity: Not Currently  Other Topics Concern   Not on file  Social History Narrative   Not on file   Social Determinants of Health   Financial Resource Strain: Not on file  Food Insecurity: Not on file  Transportation Needs: Not on file  Physical Activity: Not on file  Stress: Not on file  Social Connections: Not on file     Family History: The patient's family history includes Cancer in his father; Hypertension in his father and mother; Lung disease in his mother. There is no history of Kidney disease.  ROS:   Please see the history of present illness.     All other systems reviewed and are negative.  EKGs/Labs/Other Studies Reviewed:    The following studies were reviewed today:   EKG:  EKG is  ordered today.  The ekg ordered today demonstrates normal sinus rhythm.  Recent Labs: 03/17/2023: TSH 2.660 09/15/2023: ALT 19; BUN 16; Creatinine, Ser 1.24; Hemoglobin 16.4; Platelets 309; Potassium 4.6; Sodium 141  Recent Lipid Panel    Component Value Date/Time   CHOL 171 03/17/2023 0838   TRIG 146 03/17/2023 0838   HDL 33 (L) 03/17/2023 0838   CHOLHDL 5.2 (H) 03/17/2023 0838   LDLCALC 112 (H) 03/17/2023 0838     Risk Assessment/Calculations:         Physical Exam:    VS:  BP (!) 132/92 (BP Location: Left Arm, Patient Position: Sitting, Cuff Size: Large)   Pulse 64   Ht 5\' 10"  (1.778 m)   Wt 230 lb (104.3 kg)   SpO2 95%   BMI 33.00 kg/m     Wt Readings from Last 3 Encounters:  09/29/23 230 lb (104.3 kg)  09/21/23 221 lb (100.2 kg)  09/15/23 221 lb (100.2 kg)     GEN:  Well nourished, well developed in no acute distress HEENT: Normal NECK: No JVD; No carotid bruits CARDIAC: RRR, no murmurs, rubs, gallops RESPIRATORY:  Clear to auscultation without rales, wheezing or rhonchi   ABDOMEN: Soft, non-tender, non-distended MUSCULOSKELETAL:  No edema; mild soreness on palpation of left rib cage. SKIN: Warm and dry NEUROLOGIC:  Alert and oriented x 3 PSYCHIATRIC:  Normal affect   ASSESSMENT:    1. Paroxysmal atrial fibrillation (HCC)   2. Coronary artery disease involving native heart, unspecified vessel or lesion type, unspecified whether angina present   3. Primary hypertension    PLAN:    In order of problems listed above:  Paroxysmal atrial fibrillation, endorses palpitations,, EKG today showed sinus  rhythm.  Place cardiac monitor to evaluate A-fib burden.  Repeat echo.  Restart Eliquis 5 mg twice daily, continue Cardizem 240 mg daily. LAD and RCA coronary calcification.  Myoview 11/22 no ischemia.  Echo 11/21 EF 65%.  Continue Eliquis. Hypertension, BP slightly elevated.  Continue Cardizem 240 mg.  Add lisinopril if BP elevated at follow-up visit. HFpEF, appears euvolemic, continue Lasix as needed.   Follow-up in 2 months.   Medication Adjustments/Labs and Tests Ordered: Current medicines are reviewed at length with the patient today.  Concerns regarding medicines are outlined above.  Orders Placed This Encounter  Procedures   LONG TERM MONITOR (3-14 DAYS)   EKG 12-Lead   ECHOCARDIOGRAM COMPLETE    Meds ordered this encounter  Medications   apixaban (ELIQUIS) 5 MG TABS tablet    Sig: Take 1 tablet (5 mg total) by mouth 2 (two) times daily.    Dispense:  60 tablet    Refill:  3     Patient Instructions  Medication Instructions:   RESTART Eliquis - Take one tablet (5mg ) by mouth twice a day.   *If you need a refill on your cardiac medications before your next appointment, please call your pharmacy*   Lab Work:  None Ordered  If you have labs (blood work) drawn today and your tests are completely normal, you will receive your results only by: MyChart Message (if you have MyChart) OR A paper copy in the mail If you have any lab test that  is abnormal or we need to change your treatment, we will call you to review the results.   Testing/Procedures:  Your physician has recommended that you wear a Zio monitor.   This monitor is a medical device that records the heart's electrical activity. Doctors most often use these monitors to diagnose arrhythmias. Arrhythmias are problems with the speed or rhythm of the heartbeat. The monitor is a small device applied to your chest. You can wear one while you do your normal daily activities. While wearing this monitor if you have any symptoms to push the button and record what you felt. Once you have worn this monitor for the period of time provider prescribed (Usually 14 days), you will return the monitor device in the postage paid box. Once it is returned they will download the data collected and provide [place] with a report which the provider will then review and we will call you with those results. Important tips:  Avoid showering during the first 24 hours of wearing the monitor. Avoid excessive sweating to help maximize wear time. Do not submerge the device, no hot tubs, and no swimming pools. Keep any lotions or oils away from the patch. After 24 hours you may shower with the patch on. Take brief showers with your back facing the shower head.  Do not remove patch once it has been placed because that will interrupt data and decrease adhesive wear time. Push the button when you have any symptoms and write down what you were feeling. Once you have completed wearing your monitor, remove and place into box which has postage paid and place in your outgoing mailbox.  If for some reason you have misplaced your box then call our office and we can provide another box and/or mail it off for you.   Your physician has requested that you have an echocardiogram. Echocardiography is a painless test that uses sound waves to create images of your heart. It provides your doctor with information about the size and  shape of your heart and how well your heart's chambers and valves are working. This procedure takes approximately one hour. There are no restrictions for this procedure. Please do NOT wear cologne, perfume, aftershave, or lotions (deodorant is allowed). Please arrive 15 minutes prior to your appointment time.  Please note: We ask at that you not bring children with you during ultrasound (echo/ vascular) testing. Due to room size and safety concerns, children are not allowed in the ultrasound rooms during exams. Our front office staff cannot provide observation of children in our lobby area while testing is being conducted. An adult accompanying a patient to their appointment will only be allowed in the ultrasound room at the discretion of the ultrasound technician under special circumstances. We apologize for any inconvenience.    Follow-Up: At [facility], you and your health needs are our priority.  As part of our continuing mission to provide you with exceptional heart care, we have created designated Provider Care Teams.  These Care Teams include your primary Cardiologist (physician) and Advanced Practice Providers (APPs -  Physician Assistants and Nurse Practitioners) who all work together to provide you with the care you need, when you need it.  We recommend signing up for the patient portal called "MyChart".  Sign up information is provided on this After Visit Summary.  MyChart is used to connect with patients for Virtual Visits (Telemedicine).  Patients are able to view lab/test results, encounter notes, upcoming appointments, etc.  Non-urgent messages can be sent to your provider as well.   To learn more about what you can do with MyChart, go to [email or web address].    Your next appointment:   2 month(s)  Provider:   You may see Debbe Odea, MD or one of the following Advanced Practice Providers on your designated Care Team:   Nicolasa Ducking, NP Eula Listen,  PA-C Cadence Fransico Michael, PA-C Charlsie Quest, NP Carlos Levering, NP   Signed, Debbe Odea, MD  09/29/2023 9:42 AM    [facility]

## 2023-09-29 NOTE — Patient Instructions (Signed)
Medication Instructions:   RESTART Eliquis - Take one tablet (5mg ) by mouth twice a day.   *If you need a refill on your cardiac medications before your next appointment, please call your pharmacy*   Lab Work:  None Ordered  If you have labs (blood work) drawn today and your tests are completely normal, you will receive your results only by: MyChart Message (if you have MyChart) OR A paper copy in the mail If you have any lab test that is abnormal or we need to change your treatment, we will call you to review the results.   Testing/Procedures:  Your physician has recommended that you wear a Zio monitor.   This monitor is a medical device that records the heart's electrical activity. Doctors most often use these monitors to diagnose arrhythmias. Arrhythmias are problems with the speed or rhythm of the heartbeat. The monitor is a small device applied to your chest. You can wear one while you do your normal daily activities. While wearing this monitor if you have any symptoms to push the button and record what you felt. Once you have worn this monitor for the period of time provider prescribed (Usually 14 days), you will return the monitor device in the postage paid box. Once it is returned they will download the data collected and provide [place] with a report which the provider will then review and we will call you with those results. Important tips:  Avoid showering during the first 24 hours of wearing the monitor. Avoid excessive sweating to help maximize wear time. Do not submerge the device, no hot tubs, and no swimming pools. Keep any lotions or oils away from the patch. After 24 hours you may shower with the patch on. Take brief showers with your back facing the shower head.  Do not remove patch once it has been placed because that will interrupt data and decrease adhesive wear time. Push the button when you have any symptoms and write down what you were feeling. Once you have  completed wearing your monitor, remove and place into box which has postage paid and place in your outgoing mailbox.  If for some reason you have misplaced your box then call our office and we can provide another box and/or mail it off for you.   Your physician has requested that you have an echocardiogram. Echocardiography is a painless test that uses sound waves to create images of your heart. It provides your doctor with information about the size and shape of your heart and how well your heart's chambers and valves are working. This procedure takes approximately one hour. There are no restrictions for this procedure. Please do NOT wear cologne, perfume, aftershave, or lotions (deodorant is allowed). Please arrive 15 minutes prior to your appointment time.  Please note: We ask at that you not bring children with you during ultrasound (echo/ vascular) testing. Due to room size and safety concerns, children are not allowed in the ultrasound rooms during exams. Our front office staff cannot provide observation of children in our lobby area while testing is being conducted. An adult accompanying a patient to their appointment will only be allowed in the ultrasound room at the discretion of the ultrasound technician under special circumstances. We apologize for any inconvenience.    Follow-Up: At [facility], you and your health needs are our priority.  As part of our continuing mission to provide you with exceptional heart care, we have created designated Provider Care Teams.  These Care  Teams include your primary Cardiologist (physician) and Advanced Practice Providers (APPs -  Physician Assistants and Nurse Practitioners) who all work together to provide you with the care you need, when you need it.  We recommend signing up for the patient portal called "MyChart".  Sign up information is provided on this After Visit Summary.  MyChart is used to connect with patients for Virtual Visits  (Telemedicine).  Patients are able to view lab/test results, encounter notes, upcoming appointments, etc.  Non-urgent messages can be sent to your provider as well.   To learn more about what you can do with MyChart, go to [email or web address].    Your next appointment:   2 month(s)  Provider:   You may see Debbe Odea, MD or one of the following Advanced Practice Providers on your designated Care Team:   Nicolasa Ducking, NP Eula Listen, PA-C Cadence Fransico Michael, PA-C Charlsie Quest, NP Carlos Levering, NP

## 2023-10-02 DIAGNOSIS — I480 Paroxysmal atrial fibrillation: Secondary | ICD-10-CM

## 2023-10-06 ENCOUNTER — Telehealth: Payer: Self-pay | Admitting: Cardiology

## 2023-10-06 NOTE — Telephone Encounter (Signed)
Patient's daughter would like to know how long patient needs to wear monitor.

## 2023-10-06 NOTE — Telephone Encounter (Signed)
Per chart review, pt is to wear monitor for 14 days. Pt's daughter made aware and verbalized understanding.

## 2023-10-18 ENCOUNTER — Telehealth: Payer: Self-pay

## 2023-10-18 NOTE — Telephone Encounter (Signed)
Pt informed, appt scheduled.

## 2023-10-18 NOTE — Telephone Encounter (Signed)
1st attempt to reach pt, unable to LVM

## 2023-10-18 NOTE — Telephone Encounter (Signed)
Pt called triage line stating he has been having off and on blood in his urine since surgery (HOLEP on 09/09/2023). Pt states he had a large amount of blood in his urine last night, pt states he noticed blood clots. states urine is clear this morning. Pt states he is worried about his issue. Pt states no fever but has shakes due to bleeding and feels weak. Pt states he also has been having burning with urination since surgery. Pt states he is emptying well. Pt states when he is on antibiotics, he doesn;t have any issues. Please advise

## 2023-10-19 ENCOUNTER — Ambulatory Visit: Payer: Medicare HMO | Admitting: Urology

## 2023-10-19 NOTE — Progress Notes (Unsigned)
10/19/2023 8:09 AM   [name] [birth year] [id number]  Referring provider: Debera Lat, PA-C [address] [place],  [place] [address]  Urological history: 1. BPH with LU TS  -s/p HoLEP (08/2023)  -Prostate chips negative for malignancy   No chief complaint on file.  HPI: Matthew Jordan is a 72 y.o. male who presents today for intermittent gross hematuria, burning with urination and feeling weak.    Previous records reviewed.   UA ***  PMH: Past Medical History:  Diagnosis Date   (HFpEF) heart failure with preserved ejection fraction (HCC)    a.) stress TTE 04/26/2019: EF >55%, mod LVH, triv MR/TR/PR, PASP 26.4; b.) TTE 09/25/2020: EF >75%, no RWMAs, G2DD   Aortic atherosclerosis (HCC)    Arthritis    BPH with urinary obstruction    CAD (coronary artery disease) 2014   a.) LHC in 2014 while in [place]: 40% LAD - med mgmt; b.) MV 09/16/2021: no ischemia   Chest pain    Cholelithiasis    Dysphagia    Elevated PSA    Erectile dysfunction    a.) on PDE5i (tadalafil)   GERD (gastroesophageal reflux disease)    Hepatic cyst    HLD (hyperlipidemia)    Hypertension    On apixaban therapy    OSA (obstructive sleep apnea)    a.) does not utilize noctural PAP therapy   PAF (paroxysmal atrial fibrillation) (HCC)    a.) CHA2DS2-VASc = 4 (age, HFpEF, HTN, vascular disease history) as of 09/06/2023; b.) cardiac rate/rhythm maintained on oral diltiazem; chronically anticoagulated using apixaban   SOB (shortness of breath)    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 aneurysmal)  --> further assessed by CTA and noted to be tortuosity of the aorta rather than aneurysm    Surgical History: Past Surgical History:  Procedure Laterality Date   CORONARY ANGIOPLASTY WITH STENT PLACEMENT  11/15/2012   Location: [place]   EYE SURGERY Right 01/2023   HERNIA REPAIR     HOLEP-LASER ENUCLEATION OF THE PROSTATE  WITH MORCELLATION N/A 09/09/2023   Procedure: HOLEP-LASER ENUCLEATION OF THE PROSTATE WITH MORCELLATION;  Surgeon: Sondra Come, MD;  Location: [facility] ORS;  Service: Urology;  Laterality: N/A;    Home Medications:  Allergies as of 10/19/2023       Reactions   Amoxicillin Other (See Comments)        Medication List        Accurate as of October 19, 2023  8:09 AM. If you have any questions, ask your nurse or doctor.          apixaban 5 MG Tabs tablet Commonly known as: ELIQUIS Take 1 tablet (5 mg total) by mouth 2 (two) times daily.   clotrimazole 1 % cream Commonly known as: Clotrimazole Anti-Fungal Apply 1 Application topically 2 (two) times daily.   clotrimazole-betamethasone cream Commonly known as: Lotrisone Apply 1 Application topically 2 (two) times daily.   diltiazem 240 MG 24 hr capsule Commonly known as: CARDIZEM CD Take 1 capsule (240 mg total) by mouth daily.   nystatin powder Commonly known as: MYCOSTATIN/NYSTOP Apply 1 Application topically 3 (three) times daily.   Rocklatan 0.02-0.005 % Soln Generic drug: Netarsudil-Latanoprost Apply 1 drop to eye at bedtime.   tadalafil 5 MG tablet Commonly known as: CIALIS Take 1-2 tablets (5-10 mg total) by mouth daily as needed for erectile dysfunction (take 1 hour prior to sexual activity).  terbinafine 250 MG tablet Commonly known as: LamISIL Take 1 tablet (250 mg total) by mouth daily.   traMADol 50 MG tablet Commonly known as: Ultram Take 0.5-1 tablets (25-50 mg total) by mouth every 12 (twelve) hours as needed for moderate pain.        Allergies:  Allergies  Allergen Reactions   Amoxicillin Other (See Comments)    Family History: Family History  Problem Relation Age of Onset   Hypertension Mother    Lung disease Mother    Hypertension Father    Cancer Father        possible prostate cancer   Kidney disease Neg Hx     Social History:  reports that he quit smoking about 43 years  ago. His smoking use included cigarettes. He started smoking about 53 years ago. He has a 2.5 pack-year smoking history. He has been exposed to tobacco smoke. He has never used smokeless tobacco. He reports current alcohol use of about 2.0 standard drinks of alcohol per week. He reports that he does not use drugs.  ROS: Pertinent ROS in HPI  Physical Exam: There were no vitals taken for this visit.  Constitutional:  Well nourished. Alert and oriented, No acute distress. HEENT: [place] AT, moist mucus membranes.  Trachea midline, no masses. Cardiovascular: No clubbing, cyanosis, or edema. Respiratory: Normal respiratory effort, no increased work of breathing. GI: Abdomen is soft, non tender, non distended, no abdominal masses. Liver and spleen not palpable.  No hernias appreciated.  Stool sample for occult testing is not indicated.   GU: No CVA tenderness.  No bladder fullness or masses.  Patient with circumcised/uncircumcised phallus. ***Foreskin easily retracted***  Urethral meatus is patent.  No penile discharge. No penile lesions or rashes. Scrotum without lesions, cysts, rashes and/or edema.  Testicles are located scrotally bilaterally. No masses are appreciated in the testicles. Left and right epididymis are normal. Rectal: Patient with  normal sphincter tone. Anus and perineum without scarring or rashes. No rectal masses are appreciated. Prostate is approximately *** grams, *** nodules are appreciated. Seminal vesicles are normal. Skin: No rashes, bruises or suspicious lesions. Lymph: No cervical or inguinal adenopathy. Neurologic: Grossly intact, no focal deficits, moving all 4 extremities. Psychiatric: Normal mood and affect.   Laboratory Data: CBC w/ Differential Order: [id number] Component Ref Range & Units 2 wk ago  WBC 3.6 - 11.2 10*9/L 5.6  RBC 4.26 - 5.60 10*12/L 4.88  HGB 12.9 - 16.5 g/dL 56.2  HCT 13.0 - 86.5 % 42.9  MCV 77.6 - 95.7 fL 87.9  MCH 25.9 - 32.4 pg 31.4   MCHC 32.0 - 36.0 g/dL 78.4  RDW 69.6 - 29.5 % 13.7  MPV 6.8 - 10.7 fL 7.5  Platelet 150 - 450 10*9/L 251  Neutrophils % % 63.1  Lymphocytes % % 25  Monocytes % % 8.3  Eosinophils % % 2.8  Basophils % % 0.8  Absolute Neutrophils 1.8 - 7.8 10*9/L 3.5  Absolute Lymphocytes 1.1 - 3.6 10*9/L 1.4  Absolute Monocytes 0.3 - 0.8 10*9/L 0.5  Absolute Eosinophils 0.0 - 0.5 10*9/L 0.2  Absolute Basophils 0.0 - 0.1 10*9/L 0  Resulting Agency [facility] CL[facility]ATORIES   Specimen Collected: 10/01/23 11:55   Performed by: [facility] Last Resulted: 10/01/23 12:19  Received From: [facility]  Result Received: 10/06/23 08:33   Basic Metabolic Panel Order: [id number] Component Ref Range & Units 2 wk ago  Sodium 135 - 145 mmol/L 141  Potassium 3.4 -  4.8 mmol/L 3.9  Chloride 98 - 107 mmol/L 107  CO2 20.0 - 31.0 mmol/L 26  Anion Gap 5 - 14 mmol/L 8  BUN 9 - 23 mg/dL 8 Low   Creatinine 4.09 - 1.18 mg/dL 8.11 Low   BUN/Creatinine Ratio 11  eGFR CKD-EPI (2021) Male >=60 mL/min/1.63m2 >90  Comment: eGFR calculated with CKD-EPI 2021 equation in accordance with [employer] and [employer] of Nephrology Task Force recommendations.  Glucose 70 - 179 mg/dL 97  Calcium 8.7 - 91.4 mg/dL 9.6  Resulting Agency [facility] CLINICAL LABORATORIES   Specimen Collected: 10/01/23 11:55   Performed by: [facility] Last Resulted: 10/01/23 12:32  Received From: [facility]  Result Received: 10/06/23 08:33  Urinalysis See EPIC and HPI I have reviewed the labs.   Pertinent Imaging:  09/21/23 08:43  Scan Result 0 mL    Assessment & Plan:    1. BPH with LU TS -s/p HoLEP -PVR demonstrates adequate emptying -Reassured the patient's that the blood that he is seen in the urine, the urgency and the burning with urination are to be expected after HoLEP procedure and I recommended to decrease his water  intake  2. Perineal pain -Encouraged him to keep his appointment for colonoscopy and explained that the colon pain is not due to his prostate surgery  3. Gross hematuria -explained that blood in the urine is common after HoLEP -UA with heme -urine culture pending -Explained that his CBC at the time of his emergency room visit in [facility] was normal, so the amount of blood that he seen in his urine is not worrisome -I went ahead and rechecked a CBC to hopefully relieve his concerns -I believe this weakness that he is feeling is more of anxiety was seen blood versus anemia   No follow-ups on file.  These notes generated with voice recognition software. I apologize for typographical errors.  Cloretta Ned  [facility] [address]  [address] [place], [place] [address] [phone]

## 2023-10-20 ENCOUNTER — Ambulatory Visit: Payer: Medicare HMO

## 2023-10-20 ENCOUNTER — Ambulatory Visit: Payer: Medicare HMO | Admitting: Urology

## 2023-10-20 ENCOUNTER — Encounter: Payer: Self-pay | Admitting: Urology

## 2023-10-20 VITALS — BP 134/67 | HR 69

## 2023-10-20 DIAGNOSIS — Z09 Encounter for follow-up examination after completed treatment for conditions other than malignant neoplasm: Secondary | ICD-10-CM

## 2023-10-20 DIAGNOSIS — R310 Gross hematuria: Secondary | ICD-10-CM

## 2023-10-20 DIAGNOSIS — N138 Other obstructive and reflux uropathy: Secondary | ICD-10-CM

## 2023-10-20 DIAGNOSIS — Z87448 Personal history of other diseases of urinary system: Secondary | ICD-10-CM

## 2023-10-20 LAB — URINALYSIS, COMPLETE
Bilirubin, UA: NEGATIVE
Glucose, UA: NEGATIVE
Ketones, UA: NEGATIVE
Nitrite, UA: NEGATIVE
Specific Gravity, UA: >1.03 — ABNORMAL HIGH (ref 1.005–1.030)
Urobilinogen, Ur: 1 mg/dL (ref 0.2–1.0)
pH, UA: 6.5 (ref 5.0–7.5)

## 2023-10-20 LAB — MICROSCOPIC EXAMINATION: RBC, Urine: >30 /[HPF] — AB (ref 0–2)

## 2023-10-20 NOTE — Progress Notes (Signed)
10/20/2023 4:12 PM   Matthew Jordan 26-Nov-1950 [id number]  Referring provider: Debera Lat, PA-C [address] [place],  [place] [address]  Urological history: 1. BPH with LU TS  -s/p HoLEP (08/2023)  -Prostate chips negative for malignancy   Chief Complaint  Patient presents with   Hematuria   HPI: Matthew Jordan is a 72 y.o. male who presents today for intermittent gross hematuria, burning with urination and feeling weak.    Previous records reviewed.   He has had several weeks of clear yellow urine and then more recently he started passing gross hematuria with clots and pain at the head of his penis with urination.  Patient denies any modifying or aggravating factors.  Patient denies any recent UTI's or suprapubic/flank pain.  Patient denies any fevers, chills, nausea or vomiting.    UA 11-30 WBCs, greater than 30 RBCs, few mucus threads present and a few bacteria  PMH: Past Medical History:  Diagnosis Date   (HFpEF) heart failure with preserved ejection fraction (HCC)    a.) stress TTE 04/26/2019: EF >55%, mod LVH, triv MR/TR/PR, PASP 26.4; b.) TTE 09/25/2020: EF >75%, no RWMAs, G2DD   Aortic atherosclerosis (HCC)    Arthritis    BPH with urinary obstruction    CAD (coronary artery disease) 2014   a.) LHC in 2014 while in [place]: 40% LAD - med mgmt; b.) MV 09/16/2021: no ischemia   Chest pain    Cholelithiasis    Dysphagia    Elevated PSA    Erectile dysfunction    a.) on PDE5i (tadalafil)   GERD (gastroesophageal reflux disease)    Hepatic cyst    HLD (hyperlipidemia)    Hypertension    On apixaban therapy    OSA (obstructive sleep apnea)    a.) does not utilize noctural PAP therapy   PAF (paroxysmal atrial fibrillation) (HCC)    a.) CHA2DS2-VASc = 4 (age, HFpEF, HTN, vascular disease history) as of 09/06/2023; b.) cardiac rate/rhythm maintained on oral diltiazem; chronically anticoagulated using apixaban   SOB (shortness of breath)     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 aneurysmal)  --> further assessed by CTA and noted to be tortuosity of the aorta rather than aneurysm    Surgical History: Past Surgical History:  Procedure Laterality Date   CORONARY ANGIOPLASTY WITH STENT PLACEMENT  11/15/2012   Location: [place]   EYE SURGERY Right 01/2023   HERNIA REPAIR     HOLEP-LASER ENUCLEATION OF THE PROSTATE WITH MORCELLATION N/A 09/09/2023   Procedure: HOLEP-LASER ENUCLEATION OF THE PROSTATE WITH MORCELLATION;  Surgeon: Sondra Come, MD;  Location: [facility] ORS;  Service: Urology;  Laterality: N/A;    Home Medications:  Allergies as of 10/20/2023       Reactions   Amoxicillin Other (See Comments)        Medication List        Accurate as of October 20, 2023  4:12 PM. If you have any questions, ask your nurse or doctor.          apixaban 5 MG Tabs tablet Commonly known as: ELIQUIS Take 1 tablet (5 mg total) by mouth 2 (two) times daily.   clotrimazole 1 % cream Commonly known as: Clotrimazole Anti-Fungal Apply 1 Application topically 2 (two) times daily.   clotrimazole-betamethasone cream Commonly known as: Lotrisone Apply 1 Application topically 2 (two) times daily.   diltiazem 240 MG 24 hr capsule  Commonly known as: CARDIZEM CD Take 1 capsule (240 mg total) by mouth daily.   nystatin powder Commonly known as: MYCOSTATIN/NYSTOP Apply 1 Application topically 3 (three) times daily.   Rocklatan 0.02-0.005 % Soln Generic drug: Netarsudil-Latanoprost Apply 1 drop to eye at bedtime.   tadalafil 5 MG tablet Commonly known as: CIALIS Take 1-2 tablets (5-10 mg total) by mouth daily as needed for erectile dysfunction (take 1 hour prior to sexual activity).   terbinafine 250 MG tablet Commonly known as: LamISIL Take 1 tablet (250 mg total) by mouth daily.   traMADol 50 MG tablet Commonly known as: Ultram Take 0.5-1 tablets (25-50 mg total) by  mouth every 12 (twelve) hours as needed for moderate pain.        Allergies:  Allergies  Allergen Reactions   Amoxicillin Other (See Comments)    Family History: Family History  Problem Relation Age of Onset   Hypertension Mother    Lung disease Mother    Hypertension Father    Cancer Father        possible prostate cancer   Kidney disease Neg Hx     Social History:  reports that he quit smoking about 43 years ago. His smoking use included cigarettes. He started smoking about 53 years ago. He has a 2.5 pack-year smoking history. He has been exposed to tobacco smoke. He has never used smokeless tobacco. He reports current alcohol use of about 2.0 standard drinks of alcohol per week. He reports that he does not use drugs.  ROS: Pertinent ROS in HPI  Physical Exam: BP 134/67   Pulse 69   Constitutional:  Well nourished. Alert and oriented, No acute distress. HEENT: [place] AT, moist mucus membranes.  Trachea midline Cardiovascular: No clubbing, cyanosis, or edema. Respiratory: Normal respiratory effort, no increased work of breathing. Neurologic: Grossly intact, no focal deficits, moving all 4 extremities. Psychiatric: Normal mood and affect.   Laboratory Data: CBC w/ Differential Order: [id number] Component Ref Range & Units 2 wk ago  WBC 3.6 - 11.2 10*9/L 5.6  RBC 4.26 - 5.60 10*12/L 4.88  HGB 12.9 - 16.5 g/dL 02.7  HCT 25.3 - 66.4 % 42.9  MCV 77.6 - 95.7 fL 87.9  MCH 25.9 - 32.4 pg 31.4  MCHC 32.0 - 36.0 g/dL 40.3  RDW 47.4 - 25.9 % 13.7  MPV 6.8 - 10.7 fL 7.5  Platelet 150 - 450 10*9/L 251  Neutrophils % % 63.1  Lymphocytes % % 25  Monocytes % % 8.3  Eosinophils % % 2.8  Basophils % % 0.8  Absolute Neutrophils 1.8 - 7.8 10*9/L 3.5  Absolute Lymphocytes 1.1 - 3.6 10*9/L 1.4  Absolute Monocytes 0.3 - 0.8 10*9/L 0.5  Absolute Eosinophils 0.0 - 0.5 10*9/L 0.2  Absolute Basophils 0.0 - 0.1 10*9/L 0  Resulting Agency [facility] CLINICAL  LABORATORIES   Specimen Collected: 10/01/23 11:55   Performed by: [facility] Last Resulted: 10/01/23 12:19  Received From: [facility]  Result Received: 10/06/23 08:33   Basic Metabolic Panel Order: [id number] Component Ref Range & Units 2 wk ago  Sodium 135 - 145 mmol/L 141  Potassium 3.4 - 4.8 mmol/L 3.9  Chloride 98 - 107 mmol/L 107  CO2 20.0 - 31.0 mmol/L 26  Anion Gap 5 - 14 mmol/L 8  BUN 9 - 23 mg/dL 8 Low   Creatinine 3.29 - 1.18 mg/dL 5.18 Low   BUN/Creatinine Ratio 11  eGFR CKD-EPI (2021) Male >=60 mL/min/1.57m2 >90  Comment:  eGFR calculated with CKD-EPI 2021 equation in accordance with [employer] and [employer] of Nephrology Task Force recommendations.  Glucose 70 - 179 mg/dL 97  Calcium 8.7 - 28.4 mg/dL 9.6  Resulting Agency [facility] CLINICAL LABORATORIES   Specimen Collected: 10/01/23 11:55   Performed by: [facility] Last Resulted: 10/01/23 12:32  Received From: [facility]  Result Received: 10/06/23 08:33  Urinalysis See EPIC and HPI I have reviewed the labs.   Pertinent Imaging: N/a  Assessment & Plan:    1. BPH with LU TS -s/p HoLEP -Reassured the patient's that the blood that he is seen in the urine, the urgency and the burning with urination are to be expected after HoLEP procedure and I recommended to increase his water intake when he sees blood in the urine    2. Gross hematuria -explained that blood in the urine is common after HoLEP -UA with heme -urine culture pending -Explained that his CBC at the time of his emergency room visit in [facility] was normal, so the amount of blood that he seen in his urine is not worrisome -I went ahead and rechecked a CBC to hopefully relieve his concerns -I believe this weakness that he is feeling is more of anxiety was seen blood versus anemia  Return for Keep follow up with Dr. Richardo Hanks .  These notes generated with  voice recognition software. I apologize for typographical errors.  Cloretta Ned  [facility] [address]  [address] [place], [place] [address] [phone]

## 2023-10-21 LAB — CBC
Hematocrit: 45.1 % (ref 37.5–51.0)
Hemoglobin: 15.1 g/dL (ref 13.0–17.7)
MCH: 30.6 pg (ref 26.6–33.0)
MCHC: 33.5 g/dL (ref 31.5–35.7)
MCV: 92 fL (ref 79–97)
Platelets: 258 10*3/uL (ref 150–450)
RBC: 4.93 x10E6/uL (ref 4.14–5.80)
RDW: 13 % (ref 11.6–15.4)
WBC: 6.1 10*3/uL (ref 3.4–10.8)

## 2023-10-23 LAB — CULTURE, URINE COMPREHENSIVE

## 2023-11-02 ENCOUNTER — Ambulatory Visit: Payer: Medicare HMO

## 2023-11-25 ENCOUNTER — Telehealth: Payer: Self-pay

## 2023-11-25 NOTE — Telephone Encounter (Signed)
Letter mailed with results. 

## 2023-12-06 ENCOUNTER — Ambulatory Visit: Payer: PRIVATE HEALTH INSURANCE | Admitting: Cardiology

## 2024-01-06 ENCOUNTER — Other Ambulatory Visit: Payer: Self-pay

## 2024-01-06 DIAGNOSIS — N138 Other obstructive and reflux uropathy: Secondary | ICD-10-CM

## 2024-01-10 ENCOUNTER — Encounter: Payer: Self-pay | Admitting: Urology

## 2024-01-10 ENCOUNTER — Ambulatory Visit (INDEPENDENT_AMBULATORY_CARE_PROVIDER_SITE_OTHER): Payer: Medicare HMO | Admitting: Urology

## 2024-01-10 VITALS — BP 157/93 | HR 82 | Ht 70.0 in | Wt 236.0 lb

## 2024-01-10 DIAGNOSIS — N529 Male erectile dysfunction, unspecified: Secondary | ICD-10-CM

## 2024-01-10 DIAGNOSIS — R310 Gross hematuria: Secondary | ICD-10-CM

## 2024-01-10 DIAGNOSIS — N401 Enlarged prostate with lower urinary tract symptoms: Secondary | ICD-10-CM

## 2024-01-10 LAB — BLADDER SCAN AMB NON-IMAGING: Scan Result: 0

## 2024-01-10 MED ORDER — TADALAFIL 5 MG PO TABS: 5.0000 mg | ORAL_TABLET | Freq: Every day | ORAL | 6 refills | Status: AC | PRN

## 2024-01-10 NOTE — Progress Notes (Signed)
   01/10/2024 10:30 AM   [name] [birth year] [id number]  Reason for visit: Follow up BPH and urinary retention status post HOLEP of median lobe  HPI: 73 year old male with a long history of BPH, recurrent urinary retention, mildly elevated PSA, and recurrent urinary retention requiring at least 5 episodes of Foley catheter, as well as a suprapubic tube at some point in [place].  Evaluation with cystoscopy showed a massive prostate with a high bladder neck, large median lobe, moderate to severe trabeculations, prostate measured 213 g on TRUS.  He had previously been scheduled for HOLEP and no showed those surgery dates, until ultimately following up on 09/09/2023.  He was in Foley dependent urinary retention at that time.  In the pre-op area he had severe anxiety and concern about risk for retrograde ejaculation and incontinence, and ultimately using shared decision making he was amenable to HOLEP of only his large median lobe to try to resume spontaneous voiding with lower risk of incontinence or retrograde ejaculation.  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  He had multiple ER and clinic visits for intermittent hematuria over the first 4 weeks postoperatively, PVRs were always normal and cultures were negative.  Hematocrit has been stable ranging from 45-48 postoperatively which is stable from baseline.  I reviewed those notes, including in care everywhere from [facility].  He is back on his Eliquis.  Overall he is doing well.  Hematuria has resolved.  He is urinating with a strong stream and PVR today is normal at 0ml.  He does report some intermittent burning with urination at the tip of the penis, on exam no evidence of meatal stenosis or stricture.  Reassurance provided, could consider cystoscopy if worsening urinary symptoms in the future.  He also requested a refill of Cialis for ED, currently taking 5 mg on demand with good  results.  Cialis refilled RTC 1 year PVR   Sondra Come, MD  [facility] [address] [place], [place] [address] [phone]

## 2024-04-02 ENCOUNTER — Encounter: Payer: Self-pay | Admitting: Family Medicine

## 2024-04-02 ENCOUNTER — Ambulatory Visit (INDEPENDENT_AMBULATORY_CARE_PROVIDER_SITE_OTHER): Admitting: Family Medicine

## 2024-04-02 VITALS — BP 123/96 | HR 75 | Temp 98.5°F | Ht 70.0 in | Wt 242.8 lb

## 2024-04-02 DIAGNOSIS — R82998 Other abnormal findings in urine: Secondary | ICD-10-CM

## 2024-04-02 DIAGNOSIS — R351 Nocturia: Secondary | ICD-10-CM

## 2024-04-02 DIAGNOSIS — R399 Unspecified symptoms and signs involving the genitourinary system: Secondary | ICD-10-CM | POA: Diagnosis not present

## 2024-04-02 DIAGNOSIS — I5032 Chronic diastolic (congestive) heart failure: Secondary | ICD-10-CM | POA: Insufficient documentation

## 2024-04-02 DIAGNOSIS — R300 Dysuria: Secondary | ICD-10-CM | POA: Diagnosis not present

## 2024-04-02 DIAGNOSIS — I480 Paroxysmal atrial fibrillation: Secondary | ICD-10-CM

## 2024-04-02 DIAGNOSIS — H409 Unspecified glaucoma: Secondary | ICD-10-CM

## 2024-04-02 LAB — POCT URINALYSIS DIPSTICK
Bilirubin, UA: NEGATIVE
Blood, UA: NEGATIVE
Glucose, UA: NEGATIVE
Ketones, UA: NEGATIVE
Nitrite, UA: NEGATIVE
Protein, UA: POSITIVE — AB
Spec Grav, UA: <=1.005 — AB (ref 1.010–1.025)
Urobilinogen, UA: 0.2 U/dL
pH, UA: 8.5 — AB (ref 5.0–8.0)

## 2024-04-02 MED ORDER — PHENAZOPYRIDINE HCL 100 MG PO TABS: 100.0000 mg | ORAL_TABLET | Freq: Three times a day (TID) | ORAL | 0 refills | Status: DC | PRN

## 2024-04-02 NOTE — Progress Notes (Signed)
 Established patient visit   Patient: Matthew Jordan   DOB: 1951/01/29   73 y.o. Male  MRN: [id number] Visit Date: 04/02/2024  Today's healthcare provider: Carlean Charter, DO   Chief Complaint  Patient presents with   Urinary Tract Infection    Patient is being seen today for a possible urinary infection symptoms burning pain while urinating and some blood in urine.  Reports that it has been about a month.  Reports no treatment.  New referral to cardiology.   Subjective    Urinary Tract Infection  Associated symptoms include frequency and hematuria. Pertinent negatives include no chills, flank pain, nausea or vomiting.   Matthew Jordan is a 73 year old male with a history of prostate surgery who presents with burning during urination and hematuria. He is accompanied by his daughter.  He has been experiencing burning and pain at the end of urination for several months, with symptoms persisting since his prostate surgery in November. The pain is intermittent, with some days being more painful than others. He also reports frequent urination, approximately five to six times per night, and describes his urine stream as very small.  He notes intermittent hematuria since the surgery, with a significant episode of bleeding occurring five days ago, which resolved spontaneously. He is concerned about the presence of blood clots.  He has a history of atrial fibrillation and was previously on Eliquis , which he stopped taking around the time of his surgery. He is currently taking diltiazem  for his heart condition and eye drops for glaucoma. He has not been taking tadalafil  due to not having a partner and has not picked up the prescription due to cost. He mentions a past prescription for tamsulosin , which was stopped after his surgery as he was expected to not need it further.  He reports no current use of lisinopril  or atorvastatin , and he has been consuming a homemade juice containing  onions, garlic, carrots, and cactus. He travels frequently to [place] for personal matters.    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  - He states he has not taken Eliquis  since before the surgery.       Medications: Outpatient Medications Prior to Visit  Medication Sig   diltiazem  (CARDIZEM  CD) 240 MG 24 hr capsule Take 1 capsule (240 mg total) by mouth daily.   ROCKLATAN 0.02-0.005 % SOLN Apply 1 drop to eye at bedtime.   [DISCONTINUED] nystatin  (MYCOSTATIN /NYSTOP ) powder Apply 1 Application topically 3 (three) times daily.   apixaban  (ELIQUIS ) 5 MG TABS tablet Take 1 tablet (5 mg total) by mouth 2 (two) times daily. (Patient not taking: Reported on 04/02/2024)   tadalafil  (CIALIS ) 5 MG tablet Take 1-2 tablets (5-10 mg total) by mouth daily as needed for erectile dysfunction (take 1 hour prior to sexual activity). (Patient not taking: Reported on 04/02/2024)   [DISCONTINUED] clotrimazole  (CLOTRIMAZOLE  ANTI-FUNGAL) 1 % cream Apply 1 Application topically 2 (two) times daily. (Patient not taking: Reported on 04/02/2024)   [DISCONTINUED] clotrimazole -betamethasone  (LOTRISONE ) cream Apply 1 Application topically 2 (two) times daily. (Patient not taking: Reported on 04/02/2024)   [DISCONTINUED] terbinafine  (LAMISIL ) 250 MG tablet Take 1 tablet (250 mg total) by mouth daily. (Patient not taking: Reported on 04/02/2024)   [DISCONTINUED] traMADol  (ULTRAM ) 50 MG tablet Take 0.5-1 tablets (25-50 mg total) by mouth every 12 (  twelve) hours as needed for moderate pain. (Patient not taking: Reported on 04/02/2024)   No facility-administered medications prior to visit.    Review of Systems  Constitutional:  Negative for chills and fever.  Gastrointestinal:  Negative for abdominal pain, nausea and vomiting.  Genitourinary:  Positive for decreased urine volume, difficulty  urinating (x 5-6 months), dysuria, frequency and hematuria. Negative for flank pain.        Objective    BP (!) 123/96 (BP Location: Left Arm, Patient Position: Sitting, Cuff Size: Normal)   Pulse 75   Temp 98.5 F (36.9 C) (Oral)   Ht 5\' 10"  (1.778 m)   Wt 242 lb 12.8 oz (110.1 kg)   SpO2 96%   BMI 34.84 kg/m     Physical Exam Vitals and nursing note reviewed.  Constitutional:      General: He is not in acute distress.    Appearance: Normal appearance.  HENT:     Head: Normocephalic and atraumatic.  Eyes:     General: No scleral icterus.    Conjunctiva/sclera: Conjunctivae normal.  Cardiovascular:     Rate and Rhythm: Normal rate.  Pulmonary:     Effort: Pulmonary effort is normal.  Abdominal:     General: There is no distension.     Palpations: Abdomen is soft. There is no mass.     Tenderness: There is no abdominal tenderness. There is no right CVA tenderness, left CVA tenderness or guarding.  Neurological:     Mental Status: He is alert and oriented to person, place, and time. Mental status is at baseline.  Psychiatric:        Mood and Affect: Mood normal.        Behavior: Behavior normal.      Results for orders placed or performed in visit on 04/02/24  POCT Urinalysis Dipstick  Result Value Ref Range   Color, UA Dark yellow    Clarity, UA clear    Glucose, UA Negative Negative   Bilirubin, UA neg    Ketones, UA neg    Spec Grav, UA <=1.005 (A) 1.010 - 1.025   Blood, UA neg    pH, UA 8.5 (A) 5.0 - 8.0   Protein, UA Positive (A) Negative   Urobilinogen, UA 0.2 0.2 or 1.0 E.U./dL   Nitrite, UA neg    Leukocytes, UA Moderate (2+) (A) Negative   Appearance     Odor      Assessment & Plan    Urinary symptom or sign -     POCT urinalysis dipstick  Leukocytes in urine -     Urine Culture  Nocturia  Dysuria -     Phenazopyridine  HCl; Take 1 tablet (100 mg total) by mouth 3 (three) times daily as needed for pain.  Dispense: 10 tablet; Refill:  0  Paroxysmal atrial fibrillation (HCC) -     Ambulatory referral to Cardiology  Chronic heart failure with preserved ejection fraction (HCC)  Glaucoma of both eyes, unspecified glaucoma type     Urinary symptom or sign: Hematuria and dysuria; nocturia; leukocytes in urine Intermittent hematuria and dysuria post-prostate surgery.  Point-of-care urinalysis inconclusive. Differential includes post-surgical changes, possible infection, or other urological issues. Cystoscopy discussed. Pyridium  prescribed for dysuria relief. - Send urine culture to lab. - Prescribe Pyridium  for dysuria; patient aware to take it for a maximum of two days. - Advise urology follow-up for possible cystoscopy, as considered per Dr. Sharion Davidson previous note.  Patient and his daughter to call  urology to schedule after leaving clinic today. - Encourage increased water intake.  Benign prostatic hyperplasia with lower urinary tract symptoms Persistent lower urinary tract symptoms post-prostate surgery. Tamsulosin  discontinued post-surgery. Urology follow-up recommended. - Advise urology follow-up for symptom assessment.  Paroxysmal atrial fibrillation Managed with diltiazem . Eliquis  discontinued prior to prostate surgery. Discussed Eliquis  role and need for cardiology follow-up to reassess anticoagulation. - Advise cardiology follow-up to reassess anticoagulation needs. - Referred to new cardiologist as previous appears to no longer be in-network.  Heart failure with preserved ejection fraction No acute concerns today.  Patient has not been on medication to address his blood pressure other than diltiazem  in recent months.  However, will defer to cardiology on possibility of restarting it. - Refer to new cardiologist as noted due to insurance issues.  Glaucoma Managed with eye drops. No changes in management discussed.  General Health Maintenance Encouraged routine follow-up and preventative care. - Schedule  follow-up physical exam.    Return in about 6 weeks (around 05/14/2024), or if symptoms worsen or fail to improve, for CPE w/PCP.      I discussed the assessment and treatment plan with the patient  The patient was provided an opportunity to ask questions and all were answered. The patient agreed with the plan and demonstrated an understanding of the instructions.   The patient was advised to call back or seek an in-person evaluation if the symptoms worsen or if the condition fails to improve as anticipated.    Carlean Charter, DO  [facility] [phone] (phone) [phone] (fax)  [facility]

## 2024-04-02 NOTE — Patient Instructions (Signed)
 Please schedule follow up with your urologist for further evaluation and probable cystoscopy.

## 2024-04-04 LAB — SPECIMEN STATUS REPORT

## 2024-04-04 LAB — URINE CULTURE

## 2024-04-10 ENCOUNTER — Ambulatory Visit: Attending: Cardiology | Admitting: Cardiology

## 2024-04-12 ENCOUNTER — Ambulatory Visit: Payer: Self-pay | Admitting: Physician Assistant

## 2024-04-30 ENCOUNTER — Other Ambulatory Visit: Payer: Self-pay

## 2024-04-30 ENCOUNTER — Telehealth: Payer: Self-pay | Admitting: Physician Assistant

## 2024-04-30 DIAGNOSIS — R300 Dysuria: Secondary | ICD-10-CM

## 2024-04-30 NOTE — Telephone Encounter (Signed)
 [facility]ed refill request for the following medications:    phenazopyridine  (PYRIDIUM ) 100 MG tablet   Please advise

## 2024-05-01 MED ORDER — PHENAZOPYRIDINE HCL 100 MG PO TABS: 100.0000 mg | ORAL_TABLET | Freq: Three times a day (TID) | ORAL | 0 refills | Status: DC | PRN

## 2024-05-07 ENCOUNTER — Telehealth: Payer: Self-pay | Admitting: Cardiology

## 2024-05-07 DIAGNOSIS — I480 Paroxysmal atrial fibrillation: Secondary | ICD-10-CM

## 2024-05-07 NOTE — Telephone Encounter (Signed)
*  STAT* If patient is at the pharmacy, call can be transferred to refill team.   1. Which medications need to be refilled? (please list name of each medication and dose if known) daughter stated patient doesn't know which ones but is completely out of medicines prescribed by cardiologist. Please advise   2. Would you like to learn more about the convenience, safety, & potential cost savings by using the [facility]? no     3. Are you open to using the [facility] (Type [facility]. no ).   4. Which pharmacy/location (including street and city if local pharmacy) is [facility] sent [facility] [address] [place]   5. Do they need a 30 day or 90 day supply? 90

## 2024-05-08 MED ORDER — DILTIAZEM HCL ER COATED BEADS 240 MG PO CP24: 240.0000 mg | ORAL_CAPSULE | Freq: Every day | ORAL | 1 refills | Status: DC

## 2024-05-08 MED ORDER — APIXABAN 5 MG PO TABS: 5.0000 mg | ORAL_TABLET | Freq: Two times a day (BID) | ORAL | 5 refills | Status: AC

## 2024-05-08 NOTE — Telephone Encounter (Signed)
 Pt's medication was sent to pt's pharmacy as requested. Confirmation received.

## 2024-05-08 NOTE — Telephone Encounter (Signed)
 Prescription refill request for Eliquis  received. Indication:afib Last office visit:11/24 Scr:0.78  5/25 Age: 73 Weight:110.1  kg  Prescription refilled

## 2024-06-21 ENCOUNTER — Encounter: Payer: Self-pay | Admitting: Urology

## 2024-09-10 ENCOUNTER — Telehealth: Payer: Self-pay | Admitting: Physician Assistant

## 2024-09-10 NOTE — Telephone Encounter (Unsigned)
 Copied from CRM [id number]. Topic: Clinical - Medication Refill >> Sep 10, 2024  3:56 PM Olam RAMAN wrote: Medication: apixaban  (ELIQUIS ) 5 MG TABS tablet ROCKLATAN 0.02-0.005 % SOLN   Has the patient contacted their pharmacy? No (Agent: If no, request that the patient contact the pharmacy for the refill. If patient does not wish to contact the pharmacy document the reason why and proceed with request.) (Agent: If yes, when and what did the pharmacy advise?)  This is the patient's preferred pharmacy:  [id number] [place] [place] - [address] [place] [address] Phone: [phone] Fax: [phone]  Is this the correct pharmacy for this prescription? Yes If no, delete pharmacy and type the correct one.   Has the prescription been filled recently? No  Is the patient out of the medication? Yes  Has the patient been seen for an appointment in the last year OR does the patient have an upcoming appointment? Yes  Can we respond through MyChart? Yes  Agent: Please be advised that Rx refills may take up to 3 business days. We ask that you follow-up with your pharmacy.

## 2024-09-22 ENCOUNTER — Other Ambulatory Visit: Payer: Self-pay | Admitting: Medical

## 2024-09-25 ENCOUNTER — Ambulatory Visit: Admitting: Family Medicine

## 2024-10-03 ENCOUNTER — Other Ambulatory Visit: Payer: Self-pay

## 2024-10-04 ENCOUNTER — Ambulatory Visit: Admitting: Urology

## 2024-10-04 ENCOUNTER — Other Ambulatory Visit
Admission: RE | Admit: 2024-10-04 | Discharge: 2024-10-04 | Disposition: A | Discharge status: Home / Self Care | Source: Ambulatory Visit | Attending: Urology | Admitting: Urology

## 2024-10-04 VITALS — BP 132/81 | HR 69 | Wt 234.2 lb

## 2024-10-04 DIAGNOSIS — R350 Frequency of micturition: Secondary | ICD-10-CM | POA: Diagnosis not present

## 2024-10-04 DIAGNOSIS — R300 Dysuria: Secondary | ICD-10-CM | POA: Diagnosis present

## 2024-10-04 DIAGNOSIS — N401 Enlarged prostate with lower urinary tract symptoms: Secondary | ICD-10-CM

## 2024-10-04 DIAGNOSIS — N138 Other obstructive and reflux uropathy: Secondary | ICD-10-CM

## 2024-10-04 LAB — URINALYSIS, COMPLETE (UACMP) WITH MICROSCOPIC
Bacteria, UA: NONE SEEN
Bilirubin Urine: NEGATIVE
Glucose, UA: NEGATIVE mg/dL
Hgb urine dipstick: NEGATIVE
Ketones, ur: NEGATIVE mg/dL
Nitrite: NEGATIVE
Protein, ur: 30 mg/dL — AB
Specific Gravity, Urine: 1.024 (ref 1.005–1.030)
pH: 5 (ref 5.0–8.0)

## 2024-10-04 LAB — BLADDER SCAN AMB NON-IMAGING

## 2024-10-04 MED ORDER — LIDOCAINE HCL URETHRAL/MUCOSAL 2 % EX GEL
1.0000 | Freq: Once | CUTANEOUS | Status: AC
  Administered 2024-10-04: 1 via URETHRAL

## 2024-10-04 MED ORDER — TAMSULOSIN HCL 0.4 MG PO CAPS: 0.4000 mg | ORAL_CAPSULE | Freq: Every day | ORAL | 11 refills | Status: AC

## 2024-10-04 MED ORDER — CIPROFLOXACIN HCL 500 MG PO TABS: 500.0000 mg | ORAL_TABLET | Freq: Two times a day (BID) | ORAL | 0 refills | Status: DC

## 2024-10-04 NOTE — Progress Notes (Signed)
 Cystoscopy Procedure Note:  Indication: Dysuria  After informed consent and discussion of the procedure and its risks, Matthew Jordan was positioned and prepped in the standard fashion. Cystoscopy was performed with a flexible cystoscope. The urethra, bladder neck and entire bladder was visualized in a standard fashion. The prostate was large with obstructing lateral lobes. The ureteral orifices were visualized in their normal location and orientation.  Bladder grossly normal, no evidence of stones.  Findings: BPH with lateral lobe enlargement, no stricture or bladder abnormalities  --------------------------------------------------------  Assessment and Plan: 73 year old male here today with his daughter, even with translator history is somewhat difficult to obtain.  He has a long history of BPH and recurrent retention, mildly elevated PSA with negative biopsy, has required Foley catheter at least 6 times including a suprapubic tube at some point in [place].  Prostate measured 213 g, he was scheduled for HOLEP but no showed multiple surgery dates.  In the preop area on date of surgery 09/09/2023 he was extremely anxious and was considering canceling again, with his primary concerns being retrograde ejaculation and incontinence.  Ultimately, after a very long conversation with the patient and his daughter they opted for HOLEP of median lobe alone to try to resume spontaneous voiding and get him catheter free.  We discussed at length that this procedure may not be as durable long-term and he would have risk of recurrent retention, worsening urinary symptoms or gross hematuria.  All of this is very clearly documented from my preop H&P dated 09/09/2023.  Ultimately, underwent median lobe only HOLEP and 48 g of benign tissue were removed.  He had multiple postop ER visits for hematuria with stable hematocrit and normal PVRs, as well as multiple clinic visits.  He is on Eliquis .  I saw him last  February 2025 when he was emptying well with normal PVR of 0ml, and urinary stream was strong, he did have some intermittent dysuria but deferred further evaluation and had no evidence of meatal stenosis or stricture on exam.  Apparently has had problems with worsening burning over the last 9 months, but has not followed up with our clinic.  He was seen by a urologist Dr. Lonza with [facility] in June 2025, urinalysis was suspicious for UTI but culture was ultimately negative, and he was treated with a short 7-day course of Bactrim.  He does report this improved his dysuria but symptoms recurred.  Today, he reports some weak stream and some intermittent dysuria.  Urinalysis today is still pending.  His daughter is fairly adamant that he undergo cystoscopy for further evaluation of the dysuria.  Risks and benefits were discussed.  See above for cystoscopy findings, no evidence of stricture or stones, persistent lateral lobe hypertrophy.  I had another very long conversation with the patient and his daughter via translator today.  We reviewed possible etiologies including prostatitis/UTI, and I did recommend a longer 4-week course of antibiotics since he had improvement previously with his dysuria after a short course of antibiotics.  We also discussed this may be related to his residual lateral lobe hypertrophy and enlargement and if no improvement with antibiotics would again recommend considering other treatment options for persistent BPH.  Options would include addition of finasteride , prostate artery embolization or completion HOLEP with removal of entire prostate, with the understanding this would almost certainly cause retrograde ejaculation.  He would like to start with a course of antibiotics and close follow-up.  -Cipro  500 mg twice daily x 4 weeks  for possible prostatitis, follow-up urine culture -RTC 6 to 8 weeks PVR and symptom check.  If persistent bothersome symptoms would recommend either  adding finasteride , prostate artery embolization, or HOLEP of residual lateral lobes  I spent 50 total minutes on the day of the encounter including pre-visit review of the medical record, face-to-face time with the patient, and post visit ordering of labs/imaging/tests.  Extensive conversation with patient and daughter about his history, his prior refusal of HOLEP and decision for enucleation of the middle lobe only, and possible cause of symptoms including infection or recurrence of BPH, as well as reassurance regarding the cystoscopy findings with no evidence of stricture or stone.   Matthew Burnet, MD 10/04/2024

## 2024-10-07 LAB — URINE CULTURE: Culture: 10000 — AB

## 2024-10-08 ENCOUNTER — Ambulatory Visit: Payer: Self-pay | Admitting: Urology

## 2024-10-08 DIAGNOSIS — A498 Other bacterial infections of unspecified site: Secondary | ICD-10-CM

## 2024-10-08 MED ORDER — NITROFURANTOIN MONOHYD MACRO 100 MG PO CAPS: 100.0000 mg | ORAL_CAPSULE | Freq: Two times a day (BID) | ORAL | 0 refills | Status: AC

## 2024-10-08 NOTE — Telephone Encounter (Signed)
 Called pt's daughter per DPR. Informed her of the information below. Daughter voiced understanding. RX sent.

## 2024-12-15 ENCOUNTER — Other Ambulatory Visit: Payer: Self-pay | Admitting: Cardiology

## 2024-12-15 DIAGNOSIS — I480 Paroxysmal atrial fibrillation: Secondary | ICD-10-CM

## 2024-12-18 ENCOUNTER — Ambulatory Visit: Admitting: Urology

## 2024-12-19 ENCOUNTER — Ambulatory Visit: Admitting: Urology

## 2024-12-19 DIAGNOSIS — N401 Enlarged prostate with lower urinary tract symptoms: Secondary | ICD-10-CM

## 2025-01-07 ENCOUNTER — Ambulatory Visit: Admitting: Cardiology

## 2025-01-08 ENCOUNTER — Ambulatory Visit: Admitting: Urology

## 2025-01-09 ENCOUNTER — Ambulatory Visit: Payer: Medicare HMO | Admitting: Urology
